# Patient Record
Sex: Female | Born: 1990 | Race: Black or African American | Hispanic: No | Marital: Single | Smoking: Former smoker
Health system: Southern US, Community
[De-identification: ages and names within clinical notes are randomized; demographics above are authoritative.]

## PROBLEM LIST (undated history)

## (undated) ENCOUNTER — Inpatient Hospital Stay (HOSPITAL_COMMUNITY): Payer: Self-pay

## (undated) DIAGNOSIS — G8929 Other chronic pain: Secondary | ICD-10-CM

## (undated) DIAGNOSIS — R109 Unspecified abdominal pain: Secondary | ICD-10-CM

## (undated) DIAGNOSIS — R51 Headache: Secondary | ICD-10-CM

## (undated) DIAGNOSIS — B999 Unspecified infectious disease: Secondary | ICD-10-CM

## (undated) DIAGNOSIS — R519 Headache, unspecified: Secondary | ICD-10-CM

## (undated) DIAGNOSIS — O99019 Anemia complicating pregnancy, unspecified trimester: Secondary | ICD-10-CM

## (undated) DIAGNOSIS — R87629 Unspecified abnormal cytological findings in specimens from vagina: Secondary | ICD-10-CM

## (undated) DIAGNOSIS — A609 Anogenital herpesviral infection, unspecified: Secondary | ICD-10-CM

## (undated) DIAGNOSIS — O99619 Diseases of the digestive system complicating pregnancy, unspecified trimester: Secondary | ICD-10-CM

## (undated) DIAGNOSIS — F32A Depression, unspecified: Secondary | ICD-10-CM

## (undated) DIAGNOSIS — K117 Disturbances of salivary secretion: Secondary | ICD-10-CM

## (undated) DIAGNOSIS — IMO0002 Reserved for concepts with insufficient information to code with codable children: Secondary | ICD-10-CM

## (undated) DIAGNOSIS — F329 Major depressive disorder, single episode, unspecified: Secondary | ICD-10-CM

## (undated) DIAGNOSIS — K219 Gastro-esophageal reflux disease without esophagitis: Secondary | ICD-10-CM

## (undated) HISTORY — DX: Anogenital herpesviral infection, unspecified: A60.9

## (undated) HISTORY — DX: Diseases of the digestive system complicating pregnancy, unspecified trimester: O99.619

## (undated) HISTORY — DX: Disturbances of salivary secretion: K11.7

## (undated) HISTORY — PX: BREAST FIBROADENOMA SURGERY: SHX580

## (undated) HISTORY — DX: Other chronic pain: G89.29

## (undated) HISTORY — DX: Gastro-esophageal reflux disease without esophagitis: K21.9

## (undated) HISTORY — DX: Unspecified abdominal pain: R10.9

## (undated) HISTORY — DX: Reserved for concepts with insufficient information to code with codable children: IMO0002

---
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
## 2008-06-25 ENCOUNTER — Emergency Department (HOSPITAL_COMMUNITY): Admission: EM | Admit: 2008-06-25 | Discharge: 2008-06-25 | Payer: Self-pay | Admitting: Emergency Medicine

## 2008-11-17 ENCOUNTER — Emergency Department (HOSPITAL_COMMUNITY): Admission: EM | Admit: 2008-11-17 | Discharge: 2008-11-17 | Payer: Self-pay | Admitting: Family Medicine

## 2009-04-29 ENCOUNTER — Emergency Department (HOSPITAL_COMMUNITY): Admission: EM | Admit: 2009-04-29 | Discharge: 2009-04-29 | Payer: Self-pay | Admitting: Emergency Medicine

## 2009-11-17 ENCOUNTER — Encounter: Payer: Self-pay | Admitting: Family Medicine

## 2009-11-17 ENCOUNTER — Ambulatory Visit: Payer: Self-pay | Admitting: Family Medicine

## 2009-11-17 LAB — CONVERTED CEMR LAB
Beta hcg, urine, semiquantitative: NEGATIVE
GC Probe Amp, Genital: NEGATIVE

## 2009-11-18 ENCOUNTER — Telehealth: Payer: Self-pay | Admitting: *Deleted

## 2009-11-22 ENCOUNTER — Encounter: Admission: RE | Admit: 2009-11-22 | Discharge: 2009-11-22 | Payer: Self-pay | Admitting: Family Medicine

## 2009-12-27 ENCOUNTER — Emergency Department (HOSPITAL_COMMUNITY)
Admission: EM | Admit: 2009-12-27 | Discharge: 2009-12-27 | Payer: Self-pay | Source: Home / Self Care | Admitting: Family Medicine

## 2010-01-07 ENCOUNTER — Encounter: Payer: Self-pay | Admitting: Family Medicine

## 2010-01-19 ENCOUNTER — Ambulatory Visit: Payer: Self-pay | Admitting: Family Medicine

## 2010-01-21 ENCOUNTER — Ambulatory Visit: Payer: Self-pay | Admitting: Family Medicine

## 2010-02-04 ENCOUNTER — Encounter: Payer: Self-pay | Admitting: Family Medicine

## 2010-02-17 ENCOUNTER — Encounter: Payer: Self-pay | Admitting: Family Medicine

## 2010-04-22 ENCOUNTER — Ambulatory Visit: Payer: Self-pay | Admitting: Family Medicine

## 2010-04-22 LAB — CONVERTED CEMR LAB: Beta hcg, urine, semiquantitative: NEGATIVE

## 2010-07-13 ENCOUNTER — Emergency Department (HOSPITAL_COMMUNITY)
Admission: EM | Admit: 2010-07-13 | Discharge: 2010-07-13 | Payer: Self-pay | Source: Home / Self Care | Admitting: Emergency Medicine

## 2010-07-20 ENCOUNTER — Ambulatory Visit: Payer: Self-pay

## 2010-08-02 ENCOUNTER — Ambulatory Visit
Admission: RE | Admit: 2010-08-02 | Discharge: 2010-08-02 | Payer: Self-pay | Source: Home / Self Care | Attending: Family Medicine | Admitting: Family Medicine

## 2010-08-02 LAB — CONVERTED CEMR LAB: Beta hcg, urine, semiquantitative: NEGATIVE

## 2010-08-12 ENCOUNTER — Other Ambulatory Visit: Payer: Self-pay | Admitting: Family Medicine

## 2010-08-12 ENCOUNTER — Ambulatory Visit: Admission: RE | Admit: 2010-08-12 | Discharge: 2010-08-12 | Payer: Self-pay | Source: Home / Self Care

## 2010-08-12 ENCOUNTER — Encounter: Payer: Self-pay | Admitting: Family Medicine

## 2010-08-12 ENCOUNTER — Other Ambulatory Visit
Admission: RE | Admit: 2010-08-12 | Discharge: 2010-08-12 | Payer: Self-pay | Source: Home / Self Care | Admitting: Family Medicine

## 2010-08-12 DIAGNOSIS — R5383 Other fatigue: Secondary | ICD-10-CM

## 2010-08-12 DIAGNOSIS — R5381 Other malaise: Secondary | ICD-10-CM | POA: Insufficient documentation

## 2010-08-12 LAB — CONVERTED CEMR LAB
Chlamydia, DNA Probe: NEGATIVE
GC Probe Amp, Genital: NEGATIVE
HIV: NONREACTIVE
MCV: 87 fL (ref 78.0–100.0)
RBC: 4.32 M/uL (ref 3.87–5.11)
TSH: 4.104 microintl units/mL (ref 0.350–4.500)
WBC: 9.9 10*3/uL (ref 4.0–10.5)

## 2010-08-15 ENCOUNTER — Encounter: Payer: Self-pay | Admitting: Family Medicine

## 2010-08-23 ENCOUNTER — Telehealth: Payer: Self-pay | Admitting: *Deleted

## 2010-08-23 NOTE — Assessment & Plan Note (Signed)
Summary: tb test/eo  Nurse Visit   Allergies: 1)  ! * Milk 2)  ! * Peanuts  Immunizations Administered:  PPD Skin Test:    Vaccine Type: PPD    Site: right forearm    Mfr: Sanofi Pasteur    Dose: 0.1 ml    Route: ID    Given by: Theresia Lo RN    Exp. Date: 05/05/2010    Lot #: [id number]  Orders Added: 1)  TB Skin Test [86580] 2)  Admin 1st Vaccine [90471]    Appended Document: tb test/eo above expiration date for  PDD was entered in error. exp date is 05/06/2011

## 2010-08-23 NOTE — Letter (Signed)
Summary: Estil Daft Attorney at [facility] at [facility]   Imported By: Clydell Hakim 02/17/2010 16:25:57  _____________________________________________________________________  External Attachment:    Type:   Image     Comment:   External Document

## 2010-08-23 NOTE — Assessment & Plan Note (Signed)
Summary: READ PPD/KH  Nurse Visit   Allergies: 1)  ! * Milk 2)  ! * Peanuts  PPD Results    Date of reading: 01/21/2010    Results: 0 mm    Interpretation: negative  Orders Added: 1)  No Charge Patient Arrived (NCPA0) [NCPA0]

## 2010-08-23 NOTE — Progress Notes (Signed)
  Phone Note Outgoing Call   Call placed by: Lequita Asal  MD,  November 18, 2009 9:47 AM Summary of Call: labs all negative.  Initial call taken by: Lequita Asal  MD,  November 18, 2009 9:47 AM  Follow-up for Phone Call        informed pt of results Follow-up by: Loralee Pacas CMA,  November 18, 2009 4:44 PM

## 2010-08-23 NOTE — Assessment & Plan Note (Signed)
Summary: NP,tcb   Vital Signs:  Patient profile:   20 year old female Height:      65 inches Weight:      122.1 pounds BMI:     20.39 Temp:     98.9 degrees F oral Pulse rate:   81 / minute Pulse rhythm:   regular BP sitting:   114 / 76  (left arm) Cuff size:   regular  Vitals Entered By: Loralee Pacas CMA (November 17, 2009 2:32 PM) CC: new pt Comments bad headaches x 1 yr, sharp stomach pains and vaginal d/c x 5 months. interested in depo   CC:  new pt.  History of Present Illness: 20 y/o female here to establish care  abdominal pain- epigastric/left sided. >5 months. no associated N/V, diarrhea, constipation, dysuria, fever, chills. no relationship to BMs or meals. some relief with tylenol. h/o unprotected sex  ~6 months ago.  vaginal discharge-  ~5 months. malodorous. no vaginal pain or pruritus. unprotected sex as above.   family planning- interested in depo.   Habits & Providers  Alcohol-Tobacco-Diet     Tobacco Status: quit < 6 months     Tobacco Counseling: not to resume use of tobacco products  Allergies (verified): 1)  ! * Milk 2)  ! * Peanuts  Past History:  Past Medical History: chronic abdominal pain  Past Surgical History: breast mass surgery at age 65, fibroadenoma  Family History: breast cancer- aunt HTN- mother DM- mother, sister CVA- maternal grandmother  Social History: single, goes to [employer] denies EtOH, drugs, tobacco. Smoking Status:  quit < 6 months  Physical Exam  General:  Well-developed,well-nourished,in no acute distress; alert,appropriate and cooperative throughout examination. vitals reviewed.  Eyes:  No corneal or conjunctival inflammation noted. EOMI. Perrla. Vision grossly normal. Ears:  External ear exam shows no significant lesions or deformities.  Hearing is grossly normal bilaterally. Nose:  External nasal examination shows no deformity or inflammation. Nasal mucosa are pink and moist without lesions or exudates. Mouth:   Oral mucosa and oropharynx without lesions or exudates.  Teeth in good repair. Neck:  No deformities, masses, or tenderness noted. Lungs:  Normal respiratory effort, chest expands symmetrically. Lungs are clear to auscultation, no crackles or wheezes. Heart:  Normal rate and regular rhythm. S1 and S2 normal without gallop, murmur, click, rub or other extra sounds. Abdomen:  Bowel sounds positive,abdomen soft and without masses. +TTP of RLQ. no rebound or guarding.  Genitalia:  Normal introitus for age, no external lesions, no vaginal discharge, mucosa pink and moist, no vaginal or cervical lesions, no vaginal atrophy, no friability or hemorrhage, normal uterus size and position, +R adnexal tenderness. no CMT   Impression & Recommendations:  Problem # 1:  ABDOMINAL PAIN RIGHT LOWER QUADRANT (ICD-789.03) Assessment New GC/Chl pending. possible adnexal etiology. check ultrasound. unlikely infectious given duration. no GI sxs.  Orders: Ultrasound (Ultrasound)  Problem # 2:  FAMILY PLANNING (ICD-V25.09) Assessment: New given depo.  Orders: U Preg-[facility] (16109) Depo-Provera 150mg  (U0454)  Problem # 3:  CONTACT OR EXPOSURE TO OTHER VIRAL DISEASES (ICD-V01.79) Assessment: New labs below  Orders: GC/Chlamydia-[facility] (87591/87491) Wet Prep- [facility] [phone]) RPR-[facility] [phone]) HIV-[facility] (13086-57846) Hep Bs Ag-[facility] (96295-28413) Hep C Ab-[facility] (24401-02725)  Problem # 4:  CANDIDIASIS, VAGINAL (ICD-112.1) Assessment: New diflucan  Her updated medication list for this problem includes:    Fluconazole 150 Mg Tabs (Fluconazole) ..... One tab by mouth daily x1. repeat in 2 days. Prescriptions: FLUCONAZOLE 150 MG TABS (FLUCONAZOLE) one tab by  mouth daily x1. repeat in 2 days.  #2 x 0   Entered and Authorized by:   Lequita Asal  MD   Signed by:   Lequita Asal  MD on 11/17/2009   Method used:   Electronically to        [facility] [address] (retail)       [phone]        [place], [place]  [address]       Ph: [phone]       Fax: [phone]   RxID:   [id number]   Laboratory Results   Urine Tests  Date/Time Received: November 17, 2009 2:39 PM  Date/Time Reported: November 17, 2009 2:48 PM     Urine HCG: negative Comments: ...........test performed by...........Marland KitchenTerese Door, CMA   Date/Time Received: November 17, 2009 2:39 PM  Date/Time Reported: November 17, 2009 2:58 PM   [employer] Source: vaginal WBC/hpf: 10-20 Bacteria/hpf: 3+  Rods Clue cells/hpf: none  Negative whiff Yeast/hpf: moderate Trichomonas/hpf: none Comments: ...........test performed by...........Marland KitchenTerese Door, CMA      Medication Administration  Injection # 1:    Medication: Depo-Provera 150mg     Diagnosis: FAMILY PLANNING (ICD-V25.09)    Route: IM    Site: LUOQ gluteus    Exp Date: 11/22/2011    Lot #: [id number]    Mfr: [email or web address]    Comments: pt to rtc 07.14.2011 thru 07.27.2011    Patient tolerated injection without complications    Given by: Loralee Pacas CMA (November 17, 2009 3:18 PM)  Orders Added: 1)  U Preg-[facility] [81025] 2)  GC/Chlamydia-[facility] [87591/87491] 3)  Wet Prep- [facility] [87210] 4)  RPR-[facility] [27253-66440] 5)  HIV-[facility] [34742-59563] 6)  Hep Bs Ag-[facility] [87564-33295] 7)  Hep C Ab-[facility] [18841-66063] 8)  Ultrasound [Ultrasound] 9)  Depo-Provera 150mg  [J1055] 10)  [facility]- New Level 3 [99203]

## 2010-08-23 NOTE — Letter (Signed)
Summary: Staff Medical Report  Staff Medical Report   Imported By: Clydell Hakim 02/17/2010 16:24:59  _____________________________________________________________________  External Attachment:    Type:   Image     Comment:   External Document
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
## 2010-08-23 NOTE — Miscellaneous (Signed)
  Clinical Lists Changes  Problems: Removed problem of ABDOMINAL PAIN RIGHT LOWER QUADRANT (ICD-789.03) Removed problem of FAMILY PLANNING (ICD-V25.09) Removed problem of CONTACT OR EXPOSURE TO OTHER VIRAL DISEASES (ICD-V01.79)

## 2010-08-25 NOTE — Assessment & Plan Note (Signed)
Summary: toothache, depo/bmc   DEPO SHOT AND FLU SHOT GIVEN TODAY.Jimmy Footman, CMA  August 02, 2010 10:30 AM  *****NEXT DEPO DUE 10/19/2010-11/02/2010****  Vital Signs:  Patient profile:   20 year old female Height:      65 inches Weight:      122.3 pounds BMI:     20.43 Temp:     98.4 degrees F oral Pulse rate:   71 / minute BP sitting:   125 / 72  (right arm) Cuff size:   regular  Vitals Entered By: Jimmy Footman, CMA (August 02, 2010 9:05 AM) CC: dental referral, depo shot Is Patient Diabetic? No Pain Assessment Patient in pain? no        Primary Care Provider:  . [facility]  CC:  dental referral and depo shot.  History of Present Illness: 20 yo here for contraception and toothache.  toothache: toothache for 1 month.  right side, been taking ibuprofen with good pain control. Fever 101 several weeks ago with flu like illness, otherwise afebrile.  No swelling, troublel swallowing.  Depo:  amenorrheic since starting depo.  This will be her third shot.  last Sept 30th.  Last sex Dec 30th.  Desire to continue with depo.    Habits & Providers  Alcohol-Tobacco-Diet     Tobacco Status: never  Current Medications (verified): 1)  None  Allergies: 1)  ! * Milk 2)  ! * Peanuts PMH-FH-SH reviewed for relevance  Social History: Smoking Status:  never  Review of Systems      See HPI  Physical Exam  General:  Well-developed,well-nourished,in no acute distress; alert,appropriate and cooperative throughout examination Mouth:  left lower wisdom tooth eruption without infection Neck:  No deformities, masses, or tenderness noted. Lungs:  Normal respiratory effort, chest expands symmetrically. Lungs are clear to auscultation, no crackles or wheezes. Heart:  Normal rate and regular rhythm. S1 and S2 normal without gallop, murmur, click, rub or other extra sounds.   Impression & Recommendations:  Problem # 1:  OTHER SPEC DISORDERS TOOTH DEVELOPMENT&ERUPTION  (ICD-520.8) Will refer to dentistry for extraction  Orders: Dental Referral (Denti  Problem # 2:  CONTRACEPTIVE MANAGEMENT (ICD-V25.09) Has been > 14 weeks since last depo but only sexual encounter was within 3 months.  Will check upreg today but no need for repeat testing.  Administer depo today, advised condoms always and return for pap smear.  Complete Medication List: 1)  Depo-provera 150 Mg/ml Susp (Medroxyprogesterone acetate) .... Q 12 weeks  Other Orders: U Preg-[facility] (16109) Dental Referral (Dentist) [facility]- Est Level  3 (60454) Flu Vaccine 40yrs + (09811) Admin 1st Vaccine (91478) Depo-Provera 150mg  (G9562)  Patient Instructions: 1)  You need to schedule an gynecological exam to get your pap smear. 2)  Rememeber to get your depo every 12 weeks. 3)  You got you flu shot today! 4)  You can call back to see where you are on the dentist list.  continue ibuprofen as needed for pain.   Medication Administration  Injection # 1:    Medication: Depo-Provera 150mg     Diagnosis: CONTRACEPTIVE MANAGEMENT (ICD-V25.09)    Route: IM    Site: L deltoid    Exp Date: 11/2012    Lot #: [id number]    Mfr: [email or web address]    Comments: NEXT DEPO DUE 3/28-4/11    Patient tolerated injection without complications    Given by: Jimmy Footman, CMA (August 02, 2010 10:32 AM)  Orders Added: 1)  U Preg-[facility] [81025] 2)  Dental Referral [Dentist] 3)  [facility]- Est Level  3 [99213] 4)  Flu Vaccine 39yrs + [90658] 5)  Admin 1st Vaccine [90471] 6)  Depo-Provera 150mg  [J1055]   Immunizations Administered:  Influenza Vaccine # 1:    Vaccine Type: Fluvax 3+    Site: right deltoid    Mfr: GlaxoSmithKline    Dose: 0.5 ml    Route: IM    Given by: Jimmy Footman, CMA    Exp. Date: 01/21/2011    Lot #: [id number]    VIS given: 02/15/10 version given August 02, 2010.  Flu Vaccine Consent Questions:    Do you have a history of severe allergic reactions to this vaccine? no    Any prior history of allergic  reactions to egg and/or gelatin? no    Do you have a sensitivity to the preservative Thimersol? no    Do you have a past history of Guillan-Barre Syndrome? no    Do you currently have an acute febrile illness? no    Have you ever had a severe reaction to latex? no    Vaccine information given and explained to patient? yes    Are you currently pregnant? no   Immunizations Administered:  Influenza Vaccine # 1:    Vaccine Type: Fluvax 3+    Site: right deltoid    Mfr: GlaxoSmithKline    Dose: 0.5 ml    Route: IM    Given by: Jimmy Footman, CMA    Exp. Date: 01/21/2011    Lot #: [id number]    VIS given: 02/15/10 version given August 02, 2010.  Laboratory Results   Urine Tests  Date/Time Received: August 02, 2010 9:48 AM  Date/Time Reported: August 02, 2010 9:54 AM     Urine HCG: negative Comments: ...............test performed by......Marland KitchenBonnie A. [place], MLS (ASCP)cm

## 2010-08-25 NOTE — Letter (Signed)
Summary: Results Follow-up Letter  [facility]  [address]   [place], [place] [address]   Phone: [phone]  Fax: [phone]    08/15/2010  [address], [place]  [address]  Dear Ms. Milsap,   The following are the results of your recent test(s):  Test     Result     Pap Smear    Normal  your other tests were all normal.  These included thyroid, trichomonas, HIV, syphillis, Gonorrhea, and Chlamydia.  Sincerely,  Delbert Harness MD [facility]           Appended Document: Results Follow-up Letter mailed

## 2010-08-25 NOTE — Assessment & Plan Note (Signed)
Summary: annual gyn/KH   Vital Signs:  Patient profile:   20 year old female Height:      65 inches Weight:      119.9 pounds BMI:     20.02 Temp:     98.8 degrees F oral Pulse rate:   96 / minute BP sitting:   116 / 76  (left arm) Cuff size:   regular  Vitals Entered By: Jimmy Footman, CMA (August 12, 2010 9:10 AM) CC: annual gyn Is Patient Diabetic? No Pain Assessment Patient in pain? no        Primary Care Provider:  . [facility]  CC:  annual gyn.  History of Present Illness: 20 yo here for annual gynecological exam LMP:  amenorrheic since startign depo Contraception: depo Regular Menses: no Hx of Anemia: not confirmed FHx of Breast, Uterine, Cervical or Ovarian Cancer: brest ca in aunt in 7's Last Pap:  states had one previously with another provider whcih was normal Hx of Abnormal Pap:  No Desires STD testing: yes Abnormalities on Self-exam:  No    Habits & Providers  Alcohol-Tobacco-Diet     Tobacco Status: current  Current Medications (verified): 1)  Depo-Provera 150 Mg/ml Susp (Medroxyprogesterone Acetate) .... Q 12 Weeks  Allergies: 1)  ! * Milk 2)  ! 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 Denies EtOH, drugs. Occ smoking, relieves stress  Past medical, surgical, family and social histories (including risk factors) reviewed, and no changes noted (except as noted below).  Past Medical History: Reviewed history from 11/17/2009 and no changes required. chronic abdominal pain  Past Surgical History: Reviewed history from 11/17/2009 and no changes required. breast mass surgery at age 73, fibroadenoma  Family History: Reviewed history from 11/17/2009 and no changes required. breast cancer- aunt age 75 HTN- mother DM-  mother, sister CVA- maternal grandmother lung cancer: grandfather  Social History: Reviewed history from 04/22/2010 and no changes required. 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 Denies EtOH, drugs. Occ smoking, relieves stressSmoking Status:  current  Review of Systems      See HPI General:  Complains of fatigue; denies sleep disorder and weakness. ENT:  Complains of sore throat. CV:  Denies chest pain or discomfort and shortness of breath with exertion. Resp:  Denies cough and shortness of breath. GI:  Denies abdominal pain, constipation, and diarrhea. GU:  Denies abnormal vaginal bleeding, discharge, dysuria, and genital sores.  Physical Exam  General:  Well-developed,well-nourished,in no acute distress; alert,appropriate and cooperative throughout examination Eyes:  No corneal or conjunctival inflammation noted. EOMI. Perrla.  Ears:  External ear exam shows no significant lesions or deformities.  Otoscopic examination reveals clear canals, tympanic membranes are intact bilaterally without bulging, retraction, inflammation or discharge. Hearing is grossly normal bilaterally. Mouth:  Oral mucosa and oropharynx without lesions or exudates.  Teeth in good repair. Neck:  No deformities, masses, or tenderness noted. Breasts:  No mass, nodules, thickening, tenderness, bulging, retraction, inflamation, nipple discharge or skin changes noted.   Lungs:  Normal respiratory effort, chest expands symmetrically. Lungs are clear to auscultation, no crackles or wheezes. Heart:  Normal rate and regular rhythm. S1 and S2 normal without gallop, murmur, click, rub or other extra sounds. Abdomen:  Bowel sounds positive,abdomen soft and non-tender without masses, organomegaly  or hernias noted. Genitalia:  Pelvic Exam:        External: normal female genitalia without lesions or masses        Vagina: normal without lesions or masses        Cervix: normal without lesions or masses        Adnexa: normal  bimanual exam without masses or fullness        Uterus: normal by palpation        Pap smear: performed Extremities:  no LE edema Neurologic:  alert & oriented X3.  gait normal Psych:  Oriented X3, memory intact for recent and remote, normally interactive, good eye contact, not anxious appearing, and not depressed appearing.     Impression & Recommendations:  Problem # 1:  Gynecological examination-routine (ICD-V72.31) STD screening, reviewed history.  Discussed smoking cessation- precontemplative.  Problem # 2:  FATIGUE (ICD-780.79) Will check CBC and TSH.  Advised regular exercise. Orders: CBC-[facility] (16109) TSH-[facility] (60454-09811)  Complete Medication List: 1)  Depo-provera 150 Mg/ml Susp (Medroxyprogesterone acetate) .... Q 12 weeks  Other Orders: Pap Smear-[facility] (91478-29562) Wet Prep- [facility] [phone]) GC/Chlamydia-[facility] (87591/87491) HIV-[facility] (57846-96295) RPR-[facility] [phone])  Patient Instructions: 1)  try gummi or flintstone vitamins.  Make sure your get 400-800 units of folic acid daily. 2)  Adding a daily exercise routine can help combat fatigue 3)  Follow-up in 1 year or sooner if needed   Orders Added: 1)  Pap Smear-[facility] [02725-36644] 2)  Wet Prep- [facility] [87210] 3)  GC/Chlamydia-[facility] [87591/87491] 4)  CBC-[facility] [85027] 5)  TSH-[facility] [03474-25956] 6)  HIV-[facility] [38756-43329] 7)  RPR-[facility] [51884-16606] 8)  [facility]- New 18-41yrs [99385]     Prevention & Chronic Care Immunizations   Influenza vaccine: Fluvax 3+  (08/02/2010)    Tetanus booster: Not documented    Pneumococcal vaccine: Not documented  Other Screening   Pap smear: Not documented   Smoking status: current  (08/12/2010)  Appended Document: wet prep results    Lab Visit  Laboratory Results  Date/Time Received: August 12, 2010 9:24 AM  Date/Time Reported: August 12, 2010 9:45 AM   Wet [place] Source: vag WBC/hpf: 5-10 Bacteria/hpf: 3+ rods and cocci Clue cells/hpf: few  Negative whiff Yeast/hpf:  few Trichomonas/hpf: none Comments: ...............test performed by......Marland KitchenBonnie A. [place], MLS (ASCP)cm   Orders Today:

## 2010-08-31 NOTE — Progress Notes (Signed)
Summary: phn msg  Phone Note Call from Patient Call back at Home Phone [phone]   Caller: Patient Summary of Call: wants to know where she is on the dental referral list Initial call taken by: De Nurse,  August 23, 2010 12:26 PM  Follow-up for Phone Call        spoke with pt and informed her that there is a long wait on the dental list. We fax over order and they will get in touch with her with an appt. Follow-up by: Jimmy Footman, CMA,  August 23, 2010 4:06 PM

## 2010-09-22 ENCOUNTER — Ambulatory Visit (INDEPENDENT_AMBULATORY_CARE_PROVIDER_SITE_OTHER): Payer: Self-pay | Admitting: Family Medicine

## 2010-09-22 ENCOUNTER — Encounter: Payer: Self-pay | Admitting: Family Medicine

## 2010-09-22 DIAGNOSIS — R5381 Other malaise: Secondary | ICD-10-CM

## 2010-09-22 DIAGNOSIS — G43909 Migraine, unspecified, not intractable, without status migrainosus: Secondary | ICD-10-CM | POA: Insufficient documentation

## 2010-09-22 DIAGNOSIS — R51 Headache: Secondary | ICD-10-CM

## 2010-09-22 MED ORDER — PROCHLORPERAZINE MALEATE 5 MG PO TABS: 5.0000 mg | ORAL_TABLET | Freq: Four times a day (QID) | ORAL | Status: AC | PRN

## 2010-09-22 MED ORDER — NAPROXEN 500 MG PO TABS: 500.0000 mg | ORAL_TABLET | Freq: Two times a day (BID) | ORAL | Status: DC

## 2010-09-22 MED ORDER — PROCHLORPERAZINE MALEATE 5 MG PO TABS: 5.0000 mg | ORAL_TABLET | Freq: Four times a day (QID) | ORAL | Status: DC | PRN

## 2010-09-22 NOTE — Assessment & Plan Note (Signed)
Increased frequency of HA, uncontrolled on excedrin. At danger of confounding with medication withdrawal headaches as well. Recommend she start Naproxen as an alternative. Compazine as an adjunct for nausea associated with migraines.  F/u in 1-2 weeks with PCP to consider other abortive therapy or addition of prophylactic medication if still uncontrolled.

## 2010-09-22 NOTE — Progress Notes (Signed)
  Subjective:    Patient ID: Kathryn Fox, female    DOB: 09-16-1990, 19 y.o.   MRN: [id number]  HPI 1. Headaches- Present for many years, but increasing in frequency. Now 4/7 days of the week. Pain begins in a "knot" in her posterior neck, then radiates superiorly to right side of head, then bilateral. Yesterday was 9/10, today is only 2/10. Improved by nothing, worsened by working. Infrequently has associated phonophobia and nausea. Has taken excedrin when needed, and this does improve symptoms for 4-5 hours, then HA returns.  Denies fevers, URI symptoms, sinus congestion, ear pain, vomitting, visual changes, weakness, LOC.     Review of Systems See HPI. Also endorses some light vaginal bleeding, irregular since beginning depo injections one year ago. Last injection 08/02/10, next due in April.     Objective:   Physical Exam  Vitals reviewed. Constitutional: She is oriented to person, place, and time. She appears well-developed and well-nourished. No distress.  HENT:  Head: Normocephalic and atraumatic.  Right Ear: External ear normal.  Left Ear: External ear normal.  Nose: Nose normal.  Mouth/Throat: Oropharynx is clear and moist. No oropharyngeal exudate.       Bilateral TMs wnl.  Eyes: Conjunctivae and EOM are normal. Pupils are equal, round, and reactive to light. Right conjunctiva is not injected. Left conjunctiva is not injected.       Fundi appear wnl. Normal vasculature.  Neck: Normal range of motion. Neck supple.  Cardiovascular: Normal rate, regular rhythm and normal heart sounds.   Musculoskeletal: Normal range of motion.       Mild small foci of tenderness on right trapezius, no hypertonicity palpated.   Lymphadenopathy:    She has no cervical adenopathy.  Neurological: She is alert and oriented to person, place, and time. She has normal strength. No cranial nerve deficit or sensory deficit. She exhibits normal muscle tone. Coordination and gait normal.  Skin: She is  not diaphoretic.          Assessment & Plan:

## 2010-09-22 NOTE — Patient Instructions (Signed)
Nice to meet you. Your headaches are being caused by neck muscle spasms. Can try heating pads, massage therapy, relaxation techniques. You may try taking naproxen twice daily for 1-2 weeks, then only as needed. You can use compazine if you develop nausea, only when needed. Make an appointment in 1-2 weeks for follow up.

## 2010-10-19 ENCOUNTER — Telehealth: Payer: Self-pay | Admitting: Family Medicine

## 2010-10-19 NOTE — Telephone Encounter (Signed)
Up front to be picked up

## 2010-10-19 NOTE — Telephone Encounter (Signed)
Patient needs copy of shot record.   They would like to pick it up tomorrow afternoon.

## 2010-10-27 LAB — URINALYSIS, ROUTINE W REFLEX MICROSCOPIC
Bilirubin Urine: NEGATIVE
Ketones, ur: 15 mg/dL — AB
Nitrite: NEGATIVE
Protein, ur: NEGATIVE mg/dL
Urobilinogen, UA: 1 mg/dL (ref 0.0–1.0)

## 2010-10-27 LAB — URINE MICROSCOPIC-ADD ON

## 2010-10-27 LAB — POCT PREGNANCY, URINE: Preg Test, Ur: NEGATIVE

## 2010-10-27 LAB — RAPID STREP SCREEN (MED CTR MEBANE ONLY): Streptococcus, Group A Screen (Direct): NEGATIVE

## 2010-11-02 LAB — GC/CHLAMYDIA PROBE AMP, GENITAL
Chlamydia, DNA Probe: NEGATIVE
GC Probe Amp, Genital: NEGATIVE

## 2010-11-02 LAB — POCT URINALYSIS DIP (DEVICE)
Bilirubin Urine: NEGATIVE
Glucose, UA: NEGATIVE mg/dL
Ketones, ur: NEGATIVE mg/dL
Nitrite: NEGATIVE
Specific Gravity, Urine: 1.025 (ref 1.005–1.030)
pH: 6.5 (ref 5.0–8.0)

## 2010-11-02 LAB — WET PREP, GENITAL

## 2010-11-02 LAB — URINE CULTURE

## 2010-11-08 ENCOUNTER — Ambulatory Visit: Payer: Self-pay

## 2010-11-29 ENCOUNTER — Ambulatory Visit: Payer: Self-pay

## 2010-12-09 NOTE — Op Note (Signed)
NAME:  [name], Kathryn Fox                         ACCOUNT NO.:  [id number]   MEDICAL RECORD NO.:  [id number]                   PATIENT TYPE:  AMB   LOCATION:  [facility]                                  FACILITY:  [facility]   PHYSICIAN:  Currie Paris, M.D.           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   OPERATION PERFORMED:  Excision of right breast mass.   SURGEON:  Currie Paris, M.D.   ANESTHESIA:  General.   INDICATIONS FOR PROCEDURE:  This patient is a 20 year old who had a breast  mass noted a year ago that a biopsy showed fibroadenoma but it appeared  clinically to have enlarged and the patient and her mother wished this to be  removed.   DESCRIPTION OF PROCEDURE:  The patient was seen in the holding area and had  no further questions.  The right breast was marked as the operative side and  she was taken to the operating room.  The mass was readily palpable at the  areolar margin measuring about 4 to 5 cm.  After satisfactory general LMA  anesthesia, the breast was prepped and draped.  I anesthetized the skin  around the lesion with some 0.25% plain Marcaine and then made a curvilinear  incision at the areolar margin.  Primarily using cautery, the mass was  excised trying to take a little bit of fibrous tissue around it.  It came  out intact.  The area was checked for hemostasis and once everything  appeared to be dry,  I injected more local into the deeper tissues and  further around in the margins of the skin to help with postoperative pain  relief.   A final check was made for hemostasis and the breast then closed with 3-0  Vicryl followed by 4-0 Monocryl subcuticular and Dermabond.  The patient  tolerated the  procedure well.  There were no operative complications.  All  counts were correct.                                               Currie Paris, M.D.    CJS/MEDQ  D:  01/11/2004  T:  01/12/2004  Job:  [id number]   cc:   [facility]

## 2010-12-28 ENCOUNTER — Emergency Department (HOSPITAL_COMMUNITY)
Admission: EM | Admit: 2010-12-28 | Discharge: 2010-12-28 | Discharge status: Against Medical Advice | Payer: Self-pay | Attending: Emergency Medicine | Admitting: Emergency Medicine

## 2010-12-28 DIAGNOSIS — N63 Unspecified lump in unspecified breast: Secondary | ICD-10-CM | POA: Insufficient documentation

## 2010-12-28 DIAGNOSIS — R42 Dizziness and giddiness: Secondary | ICD-10-CM | POA: Insufficient documentation

## 2010-12-28 DIAGNOSIS — R0602 Shortness of breath: Secondary | ICD-10-CM | POA: Insufficient documentation

## 2010-12-28 DIAGNOSIS — N644 Mastodynia: Secondary | ICD-10-CM | POA: Insufficient documentation

## 2010-12-29 ENCOUNTER — Telehealth: Payer: Self-pay | Admitting: Family Medicine

## 2010-12-29 NOTE — Telephone Encounter (Signed)
Pt requesting to pick up copy of shot record asap for an appt this afternoon, call when ready

## 2010-12-29 NOTE — Telephone Encounter (Signed)
Spoke with patient and informed her that shot record is up front to be picked up. Patient's mother will come by and pick up

## 2010-12-30 ENCOUNTER — Ambulatory Visit: Payer: Self-pay | Admitting: Sports Medicine

## 2011-01-13 ENCOUNTER — Ambulatory Visit: Payer: Self-pay

## 2011-01-20 ENCOUNTER — Ambulatory Visit (INDEPENDENT_AMBULATORY_CARE_PROVIDER_SITE_OTHER): Payer: Self-pay | Admitting: *Deleted

## 2011-01-20 DIAGNOSIS — Z23 Encounter for immunization: Secondary | ICD-10-CM

## 2011-01-20 MED ORDER — TUBERCULIN PPD 5 UNIT/0.1ML ID SOLN: 5.0000 [IU] | Freq: Once | INTRADERMAL | Status: DC

## 2011-01-20 MED ORDER — TETANUS-DIPHTH-ACELL PERTUSSIS 5-2.5-18.5 LF-MCG/0.5 IM SUSP: 0.5000 mL | Freq: Once | INTRAMUSCULAR | Status: DC

## 2011-01-23 ENCOUNTER — Ambulatory Visit (INDEPENDENT_AMBULATORY_CARE_PROVIDER_SITE_OTHER): Payer: Self-pay | Admitting: *Deleted

## 2011-01-23 DIAGNOSIS — Z111 Encounter for screening for respiratory tuberculosis: Secondary | ICD-10-CM

## 2011-01-23 DIAGNOSIS — IMO0001 Reserved for inherently not codable concepts without codable children: Secondary | ICD-10-CM

## 2011-01-23 NOTE — Progress Notes (Signed)
PPD negative-0 mm. 

## 2011-01-26 ENCOUNTER — Telehealth: Payer: Self-pay | Admitting: Family Medicine

## 2011-01-26 NOTE — Telephone Encounter (Signed)
Pt needs to know if she is still on the list to see a dentist? Has been waiting since January.

## 2011-01-27 NOTE — Telephone Encounter (Signed)
Re-faxed referral.

## 2011-02-20 ENCOUNTER — Ambulatory Visit (INDEPENDENT_AMBULATORY_CARE_PROVIDER_SITE_OTHER): Payer: Self-pay | Admitting: Family Medicine

## 2011-02-20 DIAGNOSIS — M542 Cervicalgia: Secondary | ICD-10-CM

## 2011-02-20 DIAGNOSIS — G8929 Other chronic pain: Secondary | ICD-10-CM

## 2011-02-20 NOTE — Patient Instructions (Signed)
It was good to see you today. Please try sleeping on your back for a few nights and see if that helps your neck pain. I would also recommend trying some capsaicin roll-on gel for your neck.  That is something you can get at the drug store over the counter.

## 2011-02-25 DIAGNOSIS — M542 Cervicalgia: Secondary | ICD-10-CM | POA: Insufficient documentation

## 2011-02-25 DIAGNOSIS — G8929 Other chronic pain: Secondary | ICD-10-CM | POA: Insufficient documentation

## 2011-02-25 NOTE — Progress Notes (Signed)
Subjective: the patient presents today with no pressing complaints. She reports that she is having problems with neck pain that is present primarily in the morning. She says that she lies down to go to sleep, and within about 30 to 45 min. of lying down she begins to experience pain. She does not appear to interfere with her sleep, however she is in significant pain when she wakes up in the morning. She is been taking one extra strength Excedrin every morning for this. This seems to relieve the pain, and by midmorning she is pain free. She does not report any numbness, tingling, visual changes, headache, or decrease in range of motion.  Other than this, the patient reports no significant health concerns. Review of systems was performed and was negative except as stated above.  Objective: vitals reviewed. General: no acute distress, cooperative with exam. H EENT: Paris spinous muscles are slightly tense, however other than this there are no significant findings on exam of her neck. There are no bony abnormalities. There is no point tenderness. Range of motion is full. Neck is supple. Cardiovascular: regular rate and rhythm, no murmurs rubs or gallops. Respiratory: cleared auscultation bilaterally. No wheezes. Abdomen: soft nontender nondistended. Extremities: no edema. 2+ pulses.

## 2011-02-25 NOTE — Assessment & Plan Note (Signed)
The patient's neck pain appears to be primarily related how she is sleeping as it is worst in the morning and essentially gone at night. While I would prefer that she not use as much over-the-counter pain medication as she is currently, I also am not inclined to prescribe something significantly stronger. We will suggest changing sleep physician, and a topical pain relief. Depending on how she responds to this, we might also consider Voltaren gel, however will hold off on this for the time being.

## 2011-03-09 ENCOUNTER — Telehealth: Payer: Self-pay | Admitting: Family Medicine

## 2011-03-09 ENCOUNTER — Ambulatory Visit: Payer: Self-pay | Admitting: Sports Medicine

## 2011-03-09 NOTE — Telephone Encounter (Signed)
Spasms in her neck really bad and needs medication for this.  Has made an appt with Dr. Berline Chough for this afternoon.

## 2011-03-10 ENCOUNTER — Ambulatory Visit: Payer: Self-pay | Admitting: Family Medicine

## 2011-04-28 LAB — URINALYSIS, ROUTINE W REFLEX MICROSCOPIC
Bilirubin Urine: NEGATIVE
Glucose, UA: NEGATIVE mg/dL
Specific Gravity, Urine: 1.005 (ref 1.005–1.030)

## 2011-04-28 LAB — CBC
HCT: 36.5 % (ref 36.0–46.0)
Hemoglobin: 12.4 g/dL (ref 12.0–15.0)
MCHC: 34 g/dL (ref 30.0–36.0)
RBC: 4.38 MIL/uL (ref 3.87–5.11)
RDW: 12.6 % (ref 11.5–15.5)

## 2011-04-28 LAB — BASIC METABOLIC PANEL
CO2: 25 mEq/L (ref 19–32)
Calcium: 9.4 mg/dL (ref 8.4–10.5)
GFR calc Af Amer: >60 mL/min (ref >60)
Glucose, Bld: 66 mg/dL — ABNORMAL LOW (ref 70–99)
Potassium: 3.6 mEq/L (ref 3.5–5.1)
Sodium: 142 mEq/L (ref 135–145)

## 2011-04-28 LAB — DIFFERENTIAL
Eosinophils Relative: 1 % (ref 0–5)
Lymphocytes Relative: 37 % (ref 12–46)
Monocytes Absolute: 0.4 10*3/uL (ref 0.1–1.0)
Monocytes Relative: 8 % (ref 3–12)
Neutro Abs: 3.2 10*3/uL (ref 1.7–7.7)

## 2011-04-28 LAB — URINE MICROSCOPIC-ADD ON

## 2011-04-28 LAB — GC/CHLAMYDIA PROBE AMP, GENITAL
Chlamydia, DNA Probe: NEGATIVE
GC Probe Amp, Genital: NEGATIVE

## 2011-05-20 IMAGING — US US PELVIS COMPLETE
1 series · 14 of 25 positions shown · non-contrast
Comparison: None

CLINICAL DATA: Pelvic pain.  Question ovarian cyst or mass.

TRANSABDOMINAL AND TRANSVAGINAL ULTRASOUND OF PELVIS
TECHNIQUE: Both transabdominal and transvaginal ultrasound
examinations of the pelvis were performed including evaluation of
the uterus, ovaries, adnexal regions, and pelvic cul-de-sac.

[Series 1: us pelvis complete · 0.22mm/px · 14 of 60 slices shown]
[im 1/60]
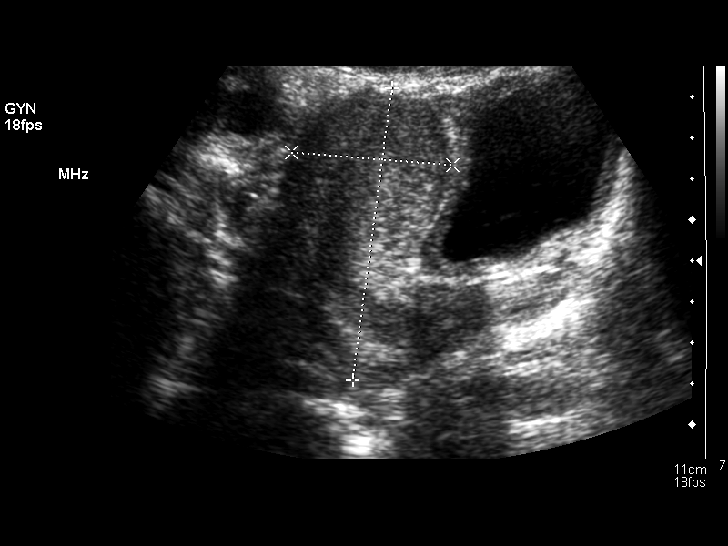
[im 5/60]
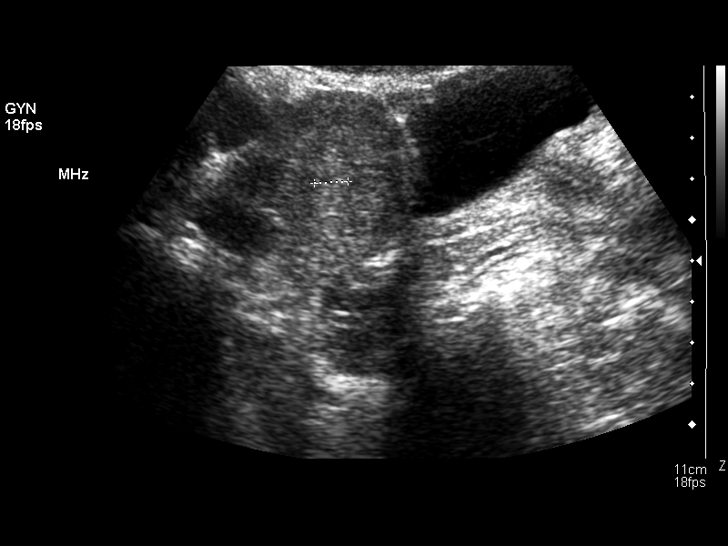
[im 10/60]
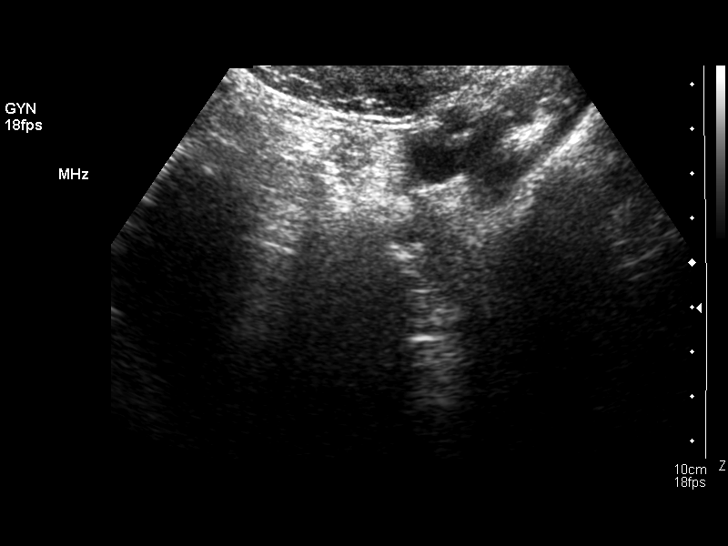
[im 15/60]
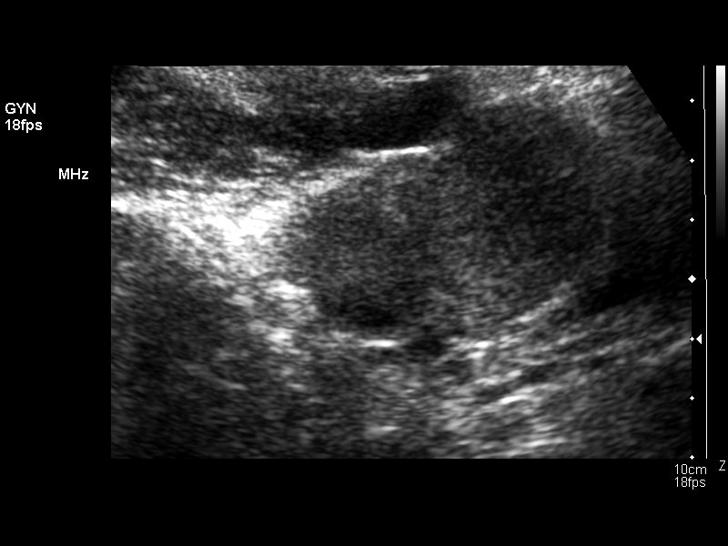
[im 20/60]
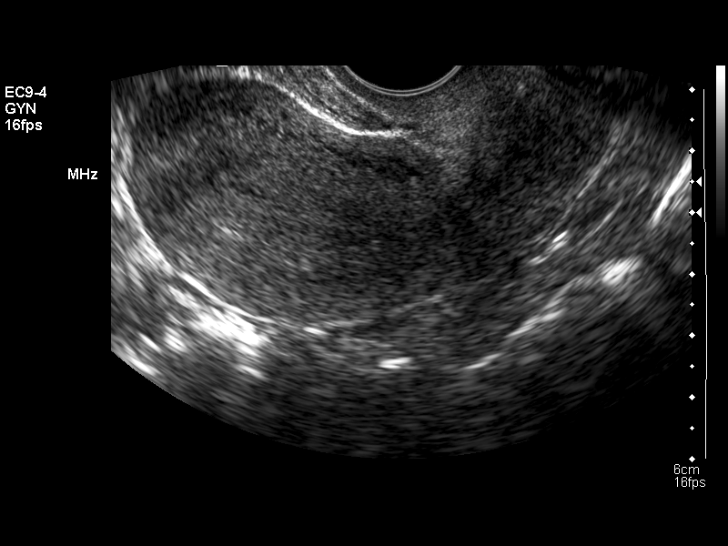
[im 23/60]
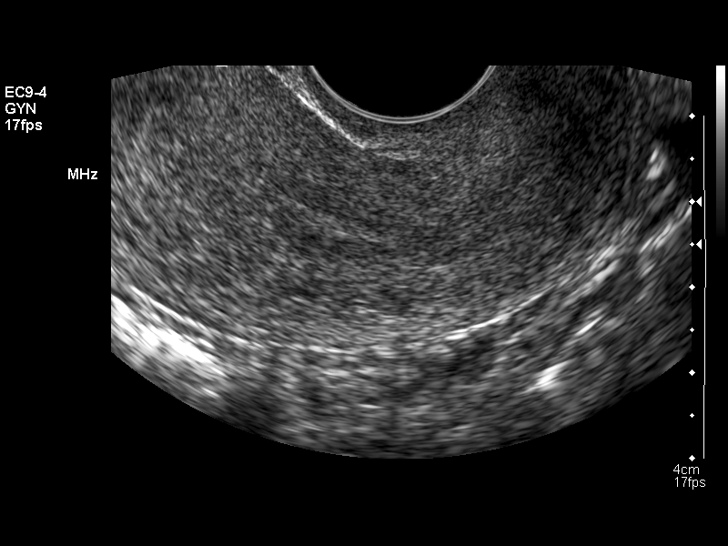
[im 28/60]
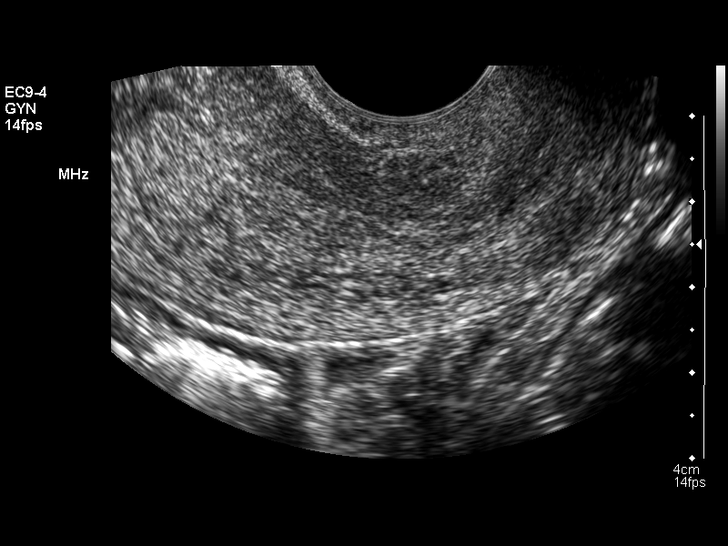
[im 32/60]
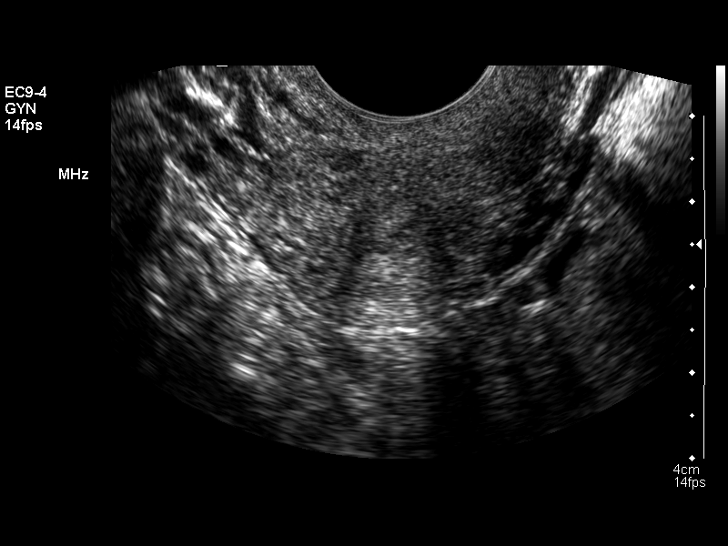
[im 37/60]
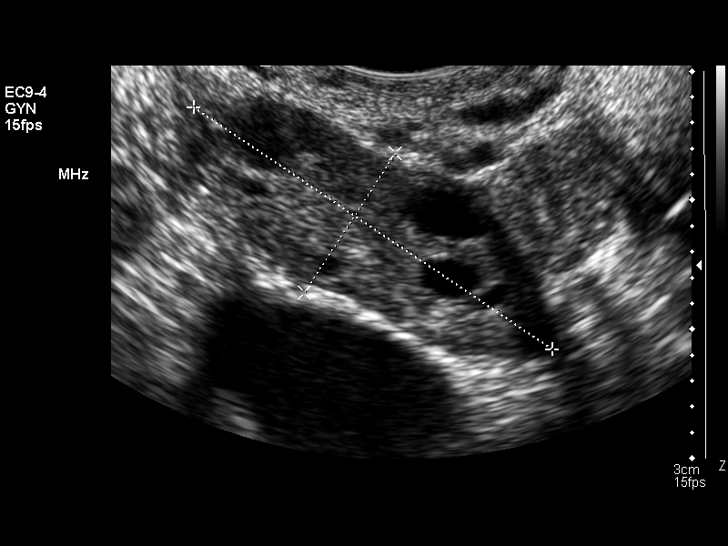
[im 40/60]
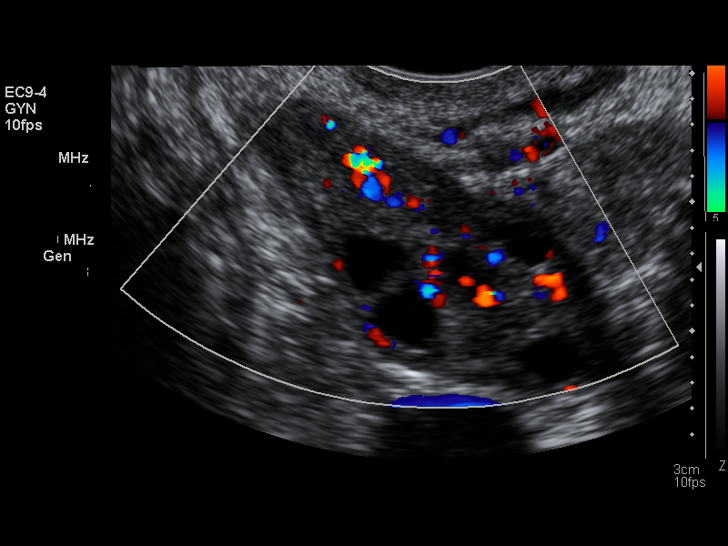
[im 45/60]
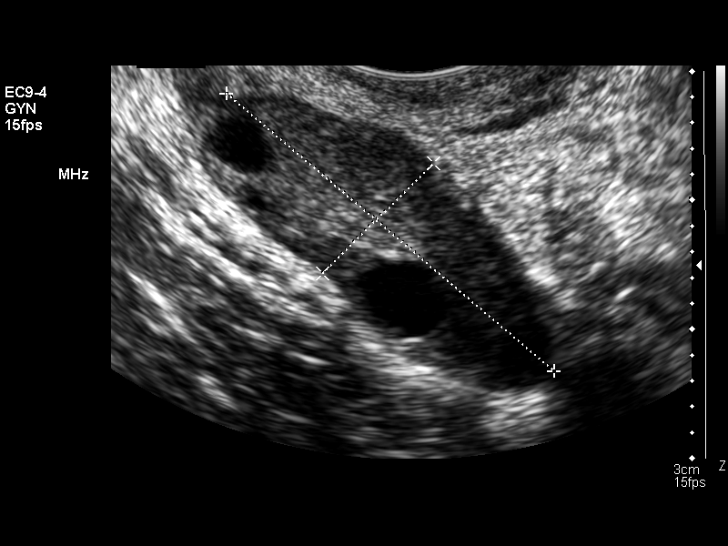
[im 50/60]
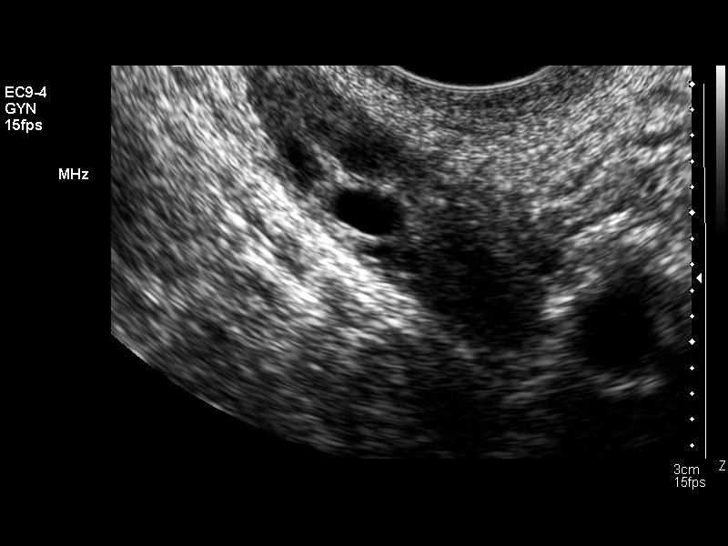
[im 55/60]
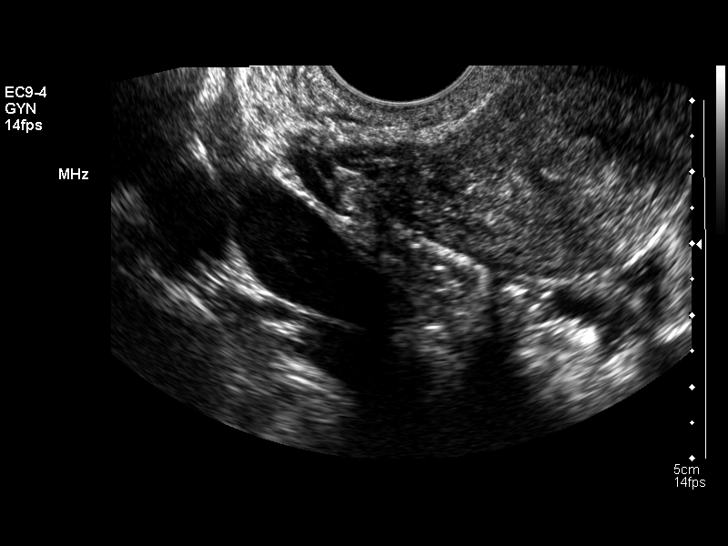
[im 60/60]
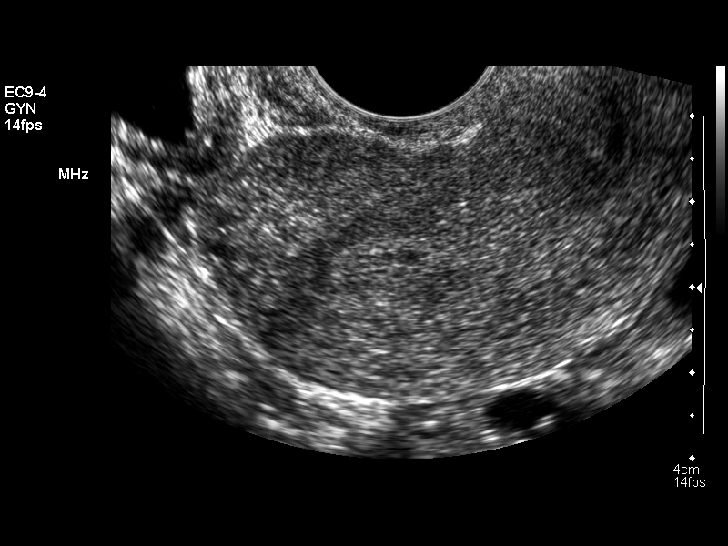

[14 of 25 positions shown; findings below may reference images not displayed]

FINDINGS: Uterus: 9.6 x 3.5 x 4.8 cm.  Small Nabothian cysts.  Echotexture is
normal.  No focal masses or significant abnormality.

Endometrium: Normal, 4 mm.

Right Ovary: 3.3 x 1.2 x 2.5 cm. Normal size and echotexture.  No
adnexal masses. The

Left Ovary: 3.4 x 1.3 x 1.4 cm. Normal size and echotexture.  No
adnexal masses.

Other Findings:  No free fluid.
IMPRESSION: Unremarkable pelvic ultrasound.

## 2011-08-24 ENCOUNTER — Encounter: Payer: Self-pay | Admitting: Family Medicine

## 2011-08-24 ENCOUNTER — Ambulatory Visit (INDEPENDENT_AMBULATORY_CARE_PROVIDER_SITE_OTHER): Payer: Self-pay | Admitting: Family Medicine

## 2011-08-24 VITALS — BP 114/72 | Ht 65.0 in | Wt 128.0 lb

## 2011-08-24 DIAGNOSIS — Z309 Encounter for contraceptive management, unspecified: Secondary | ICD-10-CM

## 2011-08-24 DIAGNOSIS — H6090 Unspecified otitis externa, unspecified ear: Secondary | ICD-10-CM | POA: Insufficient documentation

## 2011-08-24 DIAGNOSIS — H6120 Impacted cerumen, unspecified ear: Secondary | ICD-10-CM

## 2011-08-24 DIAGNOSIS — Z23 Encounter for immunization: Secondary | ICD-10-CM

## 2011-08-24 DIAGNOSIS — IMO0001 Reserved for inherently not codable concepts without codable children: Secondary | ICD-10-CM

## 2011-08-24 LAB — POCT URINE PREGNANCY: Preg Test, Ur: NEGATIVE

## 2011-08-24 MED ORDER — HYDROCORTISONE-ACETIC ACID 1-2 % OT SOLN: 3.0000 [drp] | Freq: Two times a day (BID) | OTIC | Status: AC

## 2011-08-24 MED ORDER — MEDROXYPROGESTERONE ACETATE 150 MG/ML IM SUSP
150.0000 mg | Freq: Once | INTRAMUSCULAR | Status: AC
  Administered 2011-08-24: 150 mg via INTRAMUSCULAR

## 2011-08-24 NOTE — Assessment & Plan Note (Signed)
Will rx vosol hc given small abrasion in right ear and left canal just irrigated from impacted cotton tip for several months.  Advised no more qtip use.

## 2011-08-24 NOTE — Progress Notes (Signed)
Addended byArlyss Repress on: 08/24/2011 04:31 PM   Modules accepted: Orders

## 2011-08-24 NOTE — Progress Notes (Signed)
  Subjective:    Patient ID: Kathryn Fox, female    DOB: [birth year], 21 y.o.   MRN: [id number]  HPI Here for left ear qtip lost in ear.  Left ear- was cleaning ears and lost a qtip head in it.  Did not find the time to come until today.  No pain, drainage, hearing loss, or fever.    Would like to continue depo for contraception.  Last shot was in July.  Has not had sex since then.  Review of Systemssee hpi     Objective:   Physical Exam .GEN: NAD EARS:  Right TM with small abrasion in ear canal from qtip cleaning-painfulg.  Left TM with large impacted material.  After irrigation, no significant inflammation       Assessment & Plan:

## 2011-08-24 NOTE — Patient Instructions (Addendum)
Use ear drops for several days to prevent infection from scratches and abrasion in your ear canal  DO NOT EVER USE QTIPS!  Can use a few drops of hydrogen peroxide and let it drip out if needed to clear ears out  Keep your appointment with Dr. Louanne Belton to follow-up on neck pain.  Please call 1 day in advance if you cannot make it.

## 2011-09-06 ENCOUNTER — Encounter: Payer: Self-pay | Admitting: Family Medicine

## 2011-09-06 ENCOUNTER — Ambulatory Visit (INDEPENDENT_AMBULATORY_CARE_PROVIDER_SITE_OTHER): Payer: Self-pay | Admitting: Family Medicine

## 2011-09-06 DIAGNOSIS — G43909 Migraine, unspecified, not intractable, without status migrainosus: Secondary | ICD-10-CM

## 2011-09-06 MED ORDER — ISOMETHEPTENE-CAFFEINE-APAP 130-20-500 MG PO TABS: 1.0000 | ORAL_TABLET | Freq: Four times a day (QID) | ORAL | Status: DC | PRN

## 2011-09-06 NOTE — Patient Instructions (Signed)
It was good to see you today! I want you to sign a release of information for [place] to get the CT scan records. I want you to try taking this new medication up to every six hours.  We will see if that helps with your pain.

## 2011-09-06 NOTE — Progress Notes (Signed)
Subjective: The patient is a 21 y.o. year old female who presents today for neck pain/headaches.  Has continued to have problems with these since her last visit with me.  Has tried changing pillows, medications, and chiropractor.  Exercises by chiropractor have helped somewhat but never for long.  Pain on either side, radiating to the top of head.  Primarily at night/early morning.  Sometimes associated with blurry vision. No radiation down back.  Occasional nausea/emesis related to amount of pain.  Pt on period currently so not ready for pap.  Smoking status: quit about 6 months ago.  Objective:  Filed Vitals:   09/06/11 1506  BP: 119/75  Pulse: 85   Gen: NAD HEENT: EOMI, PERRL, no adenopathy, neck supple CV: RRR, no MRG Resp: CTABL Ext: No edema  Assessment/Plan:  Please also see individual problems in problem list for problem-specific plans.

## 2011-09-22 ENCOUNTER — Ambulatory Visit (INDEPENDENT_AMBULATORY_CARE_PROVIDER_SITE_OTHER): Payer: Self-pay | Admitting: Family Medicine

## 2011-09-22 ENCOUNTER — Encounter: Payer: Self-pay | Admitting: Family Medicine

## 2011-09-22 DIAGNOSIS — G43909 Migraine, unspecified, not intractable, without status migrainosus: Secondary | ICD-10-CM

## 2011-09-22 MED ORDER — METOPROLOL TARTRATE 50 MG PO TABS: 50.0000 mg | ORAL_TABLET | Freq: Two times a day (BID) | ORAL | Status: DC

## 2011-09-22 NOTE — Progress Notes (Signed)
  Subjective:    Patient ID: Kathryn Fox, female    DOB: [birth year], 21 y.o.   MRN: [id number]  HPIWork inappt.  Headache since age 32, worsened since 2008 (age16).  Workup by "general medical clinic" included a CT scan which she never heard results-presumed normal  Described as 8/10," like someone is twisting my next and shooting to my head and to the back of my eyes" usually one side a time. occurs daily.  Photosensitive.  Has taken 6-600 mg ibuprofen and 4 Excedrin in the past 48 hours.  Takes Excedrin daily.  Not menstrual in nature.  Has tried chiropractic care which helps some.  Sister has chronic migraines, takes topamax Review of SystemsNo numbness, tingling , weakness, vision changes     Objective:   Physical Exam GEN: Alert & Oriented, No acute distress        Assessment & Plan:

## 2011-09-22 NOTE — Patient Instructions (Signed)
Use metoprolol twice a day every day to help prevent headaches  Use Excedrin only for the most severe headaches- try to limit to twice a week or less  Keep headache log and bring to next appointment  See info on researching possible triggers and things you can do to be more healthy  Follow-up with Dr. Louanne Belton in 3-4 weeks

## 2011-09-22 NOTE — Assessment & Plan Note (Signed)
Chronic migraine with medication overuse.  Will start metoprolol 50 bid for prophylaxis, discussed and gave info on medication overuse- goal will be to decrease slowly down to no more than twice per week of excedrin which is what she feels is the most effective abortive tx for her.  Gave her instructions and a log to fill out to identify triggers she can modify behaviroally.  She has already begun thinking about oversleeping as a possible one.  Will follow-up with her PCP in 3-4 weeks to discuss further.

## 2011-09-25 NOTE — Assessment & Plan Note (Signed)
Will try Prodrin for headache and attempt to get records of CT scan.  RTC 2-3 weeks

## 2011-10-03 ENCOUNTER — Ambulatory Visit: Payer: Self-pay | Admitting: Family Medicine

## 2011-10-05 ENCOUNTER — Ambulatory Visit (INDEPENDENT_AMBULATORY_CARE_PROVIDER_SITE_OTHER): Payer: Self-pay | Admitting: Family Medicine

## 2011-10-05 DIAGNOSIS — G43009 Migraine without aura, not intractable, without status migrainosus: Secondary | ICD-10-CM

## 2011-10-05 NOTE — Progress Notes (Signed)
Pt thought that she had scheduled the appt for today to have her annual exam done. I told her that this was a follow up for her headache and that she was to follow up with her pcp regarding this. Told pt that there would be no charge for this visit today and for her to reschedule with her pcp. Pt voiced understanding and will call back to make appt.Loralee Pacas Hartman

## 2011-11-13 ENCOUNTER — Ambulatory Visit: Payer: Self-pay

## 2011-11-14 ENCOUNTER — Ambulatory Visit (INDEPENDENT_AMBULATORY_CARE_PROVIDER_SITE_OTHER): Payer: Self-pay | Admitting: *Deleted

## 2011-11-14 DIAGNOSIS — Z309 Encounter for contraceptive management, unspecified: Secondary | ICD-10-CM

## 2011-11-14 MED ORDER — MEDROXYPROGESTERONE ACETATE 150 MG/ML IM SUSP
150.0000 mg | Freq: Once | INTRAMUSCULAR | Status: AC
  Administered 2011-11-14: 150 mg via INTRAMUSCULAR

## 2011-12-23 ENCOUNTER — Encounter (HOSPITAL_COMMUNITY): Payer: Self-pay | Admitting: Emergency Medicine

## 2011-12-23 ENCOUNTER — Emergency Department (HOSPITAL_COMMUNITY)
Admission: EM | Admit: 2011-12-23 | Discharge: 2011-12-23 | Disposition: A | Discharge status: Home / Self Care | Payer: Self-pay | Attending: Emergency Medicine | Admitting: Emergency Medicine

## 2011-12-23 DIAGNOSIS — T3795XA Adverse effect of unspecified systemic anti-infective and antiparasitic, initial encounter: Secondary | ICD-10-CM | POA: Insufficient documentation

## 2011-12-23 DIAGNOSIS — T7840XA Allergy, unspecified, initial encounter: Secondary | ICD-10-CM

## 2011-12-23 DIAGNOSIS — L500 Allergic urticaria: Secondary | ICD-10-CM | POA: Insufficient documentation

## 2011-12-23 MED ORDER — FAMOTIDINE IN NACL 20-0.9 MG/50ML-% IV SOLN
20.0000 mg | Freq: Once | INTRAVENOUS | Status: AC
  Administered 2011-12-23: 20 mg via INTRAVENOUS
  Filled 2011-12-23: qty 50

## 2011-12-23 MED ORDER — METHYLPREDNISOLONE SODIUM SUCC 125 MG IJ SOLR
125.0000 mg | Freq: Once | INTRAMUSCULAR | Status: AC
  Administered 2011-12-23: 125 mg via INTRAVENOUS
  Filled 2011-12-23: qty 2

## 2011-12-23 MED ORDER — PREDNISONE 10 MG PO TABS: 20.0000 mg | ORAL_TABLET | Freq: Every day | ORAL | Status: DC

## 2011-12-23 MED ORDER — FAMOTIDINE 20 MG PO TABS: 20.0000 mg | ORAL_TABLET | Freq: Two times a day (BID) | ORAL | Status: DC

## 2011-12-23 NOTE — ED Notes (Signed)
Pt BIB EMS. Pt took Septa DS two hrs PTA. Pt took sister's med. Pt developed hives and itching from waist up. No acute distress. Pt speaks in clear sentences. Pt given Benadryl 50mg  PO per EMS and Zantac 50mg  IVPB.

## 2011-12-23 NOTE — ED Provider Notes (Signed)
Medical screening examination/treatment/procedure(s) were performed by non-physician practitioner and as supervising physician I was immediately available for consultation/collaboration. Devoria Albe, MD, Armando Gang   Ward Givens, MD 12/23/11 [phone]

## 2011-12-23 NOTE — ED Notes (Signed)
[id number]<AV> Expected date:12/23/11<BR> Expected time: 4:14 AM<BR> Means of arrival:Ambulance<BR> Comments:<BR> Allergic reaction/Hives, face swelling

## 2011-12-23 NOTE — Discharge Instructions (Signed)
Allergic Reaction  Allergic reactions can be caused by anything your body is sensitive to. Your body may be sensitive to food, medicines, molds, pollens, cockroaches, dust mites, pets, insect stings, and other things around you. An allergic reaction may cause puffiness (swelling), itching, sneezing, coughing, or problems breathing.   Allergies cannot be cured, but they can be controlled with medicine. Some allergies happen only at certain times of the year. Try to stay away from what causes your reaction if possible. Sometimes, it is hard to tell what causes your reaction.  HOME CARE  If you have a rash or red patches (hives) on your skin:   Take medicines as told by your doctor.   Do not drive or drink alcohol after taking medicines. They can make you sleepy.   Put cold cloths on your skin. Take baths in cool water. This will help your itching. Do not take hot baths or showers. Heat will make the itching worse.   If your allergies get worse, your doctor might give you other medicines. Talk to your doctor if problems continue.  GET HELP RIGHT AWAY IF:    You have trouble breathing.   You have a tight feeling in your chest or throat.   Your mouth gets puffy (swollen).   You have red, itchy patches on your skin (hives) that get worse.   You have itching all over your body.  MAKE SURE YOU:    Understand these instructions.   Will watch your condition.   Will get help right away if you are not doing well or get worse.  Document Released: 06/28/2009 Document Revised: 06/29/2011 Document Reviewed: 06/28/2009  Exit[employer] Patient Information 2012 [employer].

## 2011-12-23 NOTE — ED Notes (Signed)
NP Schulz at bedside. 

## 2011-12-23 NOTE — ED Provider Notes (Signed)
History     CSN: [id number]  Arrival date & time 12/23/11  0417   First MD Initiated Contact with Patient 12/23/11 0423      Chief Complaint  Patient presents with  . Allergic Reaction  . Urticaria    (Consider location/radiation/quality/duration/timing/severity/associated sxs/prior treatment) HPI Comments: Patient was given 2 Septra tablets by her mother for urinary symptoms, dysuria, approximately 30 minutes later, she developed generalized flushing, and pure.  This.  No respiratory, shortness of breath, throat, swelling.  Mother called EMS who administered 50 mg of Benadryl and 50 mg of Zantac.  She is still having some generalized itching  Patient is a 21 y.o. female presenting with allergic reaction and urticaria. The history is provided by the patient and a parent.  Allergic Reaction The primary symptoms are  rash and urticaria. The primary symptoms do not include shortness of breath, nausea, vomiting, diarrhea, dizziness, palpitations or angioedema.  Urticaria Associated symptoms include a rash. Pertinent negatives include no fever, joint swelling, nausea, vomiting or weakness.    Past Medical History  Diagnosis Date  . Chronic abdominal pain     Past Surgical History  Procedure Date  . Breast fibroadenoma surgery     Family History  Problem Relation Age of Onset  . Hypertension Mother   . Hyperlipidemia Mother   . Diabetes Mother   . Cancer Maternal Aunt     Breast  . Diabetes Maternal Aunt   . Stroke Maternal Grandmother   . Cancer Paternal Grandfather     History  Substance Use Topics  . Smoking status: Former Smoker -- 0.2 packs/day    Types: Cigarettes  . Smokeless tobacco: Not on file  . Alcohol Use: No    OB History    Grav Para Term Preterm Abortions TAB SAB Ect Mult Living                  Review of Systems  Constitutional: Negative for fever.  HENT: Negative for facial swelling and trouble swallowing.   Respiratory: Negative for  shortness of breath.   Cardiovascular: Negative for palpitations.  Gastrointestinal: Negative for nausea, vomiting and diarrhea.  Genitourinary: Positive for dysuria. Negative for vaginal discharge.  Musculoskeletal: Negative for joint swelling.  Skin: Positive for rash. Negative for pallor.  Neurological: Negative for dizziness and weakness.    Allergies  Milk-related compounds and Peanut-containing drug products  Home Medications   Current Outpatient Rx  Name Route Sig Dispense Refill  . ASPIRIN-ACETAMINOPHEN-CAFFEINE 250-250-65 MG PO TABS Oral Take 1 tablet by mouth every 6 (six) hours as needed.    Marland Kitchen MEDROXYPROGESTERONE ACETATE 150 MG/ML IM SUSP Intramuscular Inject 150 mg into the muscle every 3 (three) months.      Marland Kitchen METOPROLOL TARTRATE 50 MG PO TABS Oral Take 1 tablet (50 mg total) by mouth 2 (two) times daily. 60 tablet 1    BP 124/63  Pulse 93  Temp(Src) 99.3 F (37.4 C) (Oral)  Resp 22  SpO2 100%  Physical Exam  Constitutional: She is oriented to person, place, and time. She appears well-developed and well-nourished.  HENT:  Head: Normocephalic.  Eyes: Pupils are equal, round, and reactive to light.  Neck: Normal range of motion.  Cardiovascular: Normal rate.   Pulmonary/Chest: Effort normal and breath sounds normal. No respiratory distress. She has no wheezes.  Musculoskeletal: Normal range of motion.  Neurological: She is alert and oriented to person, place, and time.  Skin: Skin is warm. No rash noted. There is  erythema.    ED Course  Procedures (including critical care time)   Labs Reviewed  URINALYSIS, ROUTINE W REFLEX MICROSCOPIC   No results found.   No diagnosis found.    MDM  Allergic reaction, most likely to Septra         Arman Filter, NP 12/23/11 [phone]

## 2011-12-23 NOTE — ED Notes (Signed)
Pt speaks in clear, complete sentences. Pt states she is able to swallow her own saliva.

## 2011-12-27 ENCOUNTER — Ambulatory Visit: Payer: Self-pay | Admitting: Family Medicine

## 2011-12-28 ENCOUNTER — Encounter: Payer: Self-pay | Admitting: Family Medicine

## 2011-12-28 ENCOUNTER — Other Ambulatory Visit (HOSPITAL_COMMUNITY)
Admission: RE | Admit: 2011-12-28 | Discharge: 2011-12-28 | Disposition: A | Discharge status: Home / Self Care | Payer: Self-pay | Source: Ambulatory Visit | Attending: Family Medicine | Admitting: Family Medicine

## 2011-12-28 ENCOUNTER — Ambulatory Visit (INDEPENDENT_AMBULATORY_CARE_PROVIDER_SITE_OTHER): Payer: Self-pay | Admitting: Family Medicine

## 2011-12-28 VITALS — BP 114/80 | HR 94 | Temp 98.4°F | Ht 64.0 in | Wt 118.0 lb

## 2011-12-28 DIAGNOSIS — A499 Bacterial infection, unspecified: Secondary | ICD-10-CM

## 2011-12-28 DIAGNOSIS — N760 Acute vaginitis: Secondary | ICD-10-CM

## 2011-12-28 DIAGNOSIS — Z113 Encounter for screening for infections with a predominantly sexual mode of transmission: Secondary | ICD-10-CM | POA: Insufficient documentation

## 2011-12-28 DIAGNOSIS — R300 Dysuria: Secondary | ICD-10-CM

## 2011-12-28 DIAGNOSIS — B9689 Other specified bacterial agents as the cause of diseases classified elsewhere: Secondary | ICD-10-CM

## 2011-12-28 LAB — POCT WET PREP (WET MOUNT)

## 2011-12-28 LAB — POCT UA - MICROSCOPIC ONLY

## 2011-12-28 LAB — POCT URINALYSIS DIPSTICK
Bilirubin, UA: NEGATIVE
Glucose, UA: NEGATIVE
Leukocytes, UA: NEGATIVE
Nitrite, UA: NEGATIVE
pH, UA: 6

## 2011-12-28 MED ORDER — METRONIDAZOLE 500 MG PO TABS: 500.0000 mg | ORAL_TABLET | Freq: Two times a day (BID) | ORAL | Status: AC

## 2011-12-28 NOTE — Assessment & Plan Note (Addendum)
Rx for flagyl for one week.  Patient education given verbally and written.  Follow up if no improvement.  Urine not indicative of UTI.
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
## 2011-12-28 NOTE — Patient Instructions (Signed)
Bacterial Vaginosis Bacterial vaginosis is an infection of the vagina. A healthy vagina has many kinds of good germs (bacteria). Sometimes the number of good germs can change. This allows bad germs to move in and cause an infection. You may be given medicine (antibiotics) to treat the infection. Or, you may not need treatment at all. HOME CARE  Take your medicine as told. Finish them even if you start to feel better.   Do not have sex until you finish your medicine.   Do not douche.   Practice safe sex.   Tell your sex partner that you have an infection. They should see their doctor for treatment if they have problems.  GET HELP RIGHT AWAY IF:  You do not get better after 3 days of treatment.   You have grey fluid (discharge) coming from your vagina.   You have pain.   You have a temperature of 102 F (38.9 C) or higher.  MAKE SURE YOU:   Understand these instructions.   Will watch your condition.   Will get help right away if you are not doing well or get worse.  Document Released: 04/18/2008 Document Revised: 06/29/2011 Document Reviewed: 04/18/2008 Exit[employer] Patient Information 2012 [employer]. 

## 2012-01-01 ENCOUNTER — Ambulatory Visit: Payer: Self-pay | Admitting: Family Medicine

## 2012-01-01 ENCOUNTER — Encounter: Payer: Self-pay | Admitting: Family Medicine

## 2012-01-15 ENCOUNTER — Ambulatory Visit (INDEPENDENT_AMBULATORY_CARE_PROVIDER_SITE_OTHER): Payer: Self-pay | Admitting: Family Medicine

## 2012-01-15 VITALS — BP 125/74

## 2012-01-15 DIAGNOSIS — M542 Cervicalgia: Secondary | ICD-10-CM

## 2012-01-15 DIAGNOSIS — G43909 Migraine, unspecified, not intractable, without status migrainosus: Secondary | ICD-10-CM

## 2012-01-15 DIAGNOSIS — C801 Malignant (primary) neoplasm, unspecified: Secondary | ICD-10-CM | POA: Insufficient documentation

## 2012-01-15 MED ORDER — CYCLOBENZAPRINE HCL 5 MG PO TABS: 5.0000 mg | ORAL_TABLET | Freq: Every day | ORAL | Status: AC | PRN
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
## 2012-01-16 NOTE — Assessment & Plan Note (Signed)
Chronic neck pain that is a trigger for her migraines. Reviewed imaging which shows loss of normal cervical curvature (consistent w chronic spasm). Will set up PT---use flexeril prn esp on days she attends PT. Discussed etiology and natural course. Reviewed her films with her. rtc 4-5 weeks

## 2012-01-30 ENCOUNTER — Ambulatory Visit: Payer: Self-pay

## 2012-01-30 ENCOUNTER — Ambulatory Visit: Payer: Self-pay | Attending: Family Medicine

## 2012-01-30 DIAGNOSIS — M542 Cervicalgia: Secondary | ICD-10-CM | POA: Insufficient documentation

## 2012-01-30 DIAGNOSIS — IMO0001 Reserved for inherently not codable concepts without codable children: Secondary | ICD-10-CM | POA: Insufficient documentation

## 2012-02-08 ENCOUNTER — Ambulatory Visit: Payer: Self-pay | Admitting: Rehabilitation

## 2012-02-09 ENCOUNTER — Encounter: Payer: Self-pay | Admitting: Rehabilitation

## 2012-02-12 ENCOUNTER — Encounter: Payer: Self-pay | Admitting: Rehabilitative and Restorative Service Providers"

## 2012-02-15 ENCOUNTER — Ambulatory Visit: Payer: Self-pay | Admitting: Rehabilitation

## 2012-02-15 ENCOUNTER — Encounter (HOSPITAL_COMMUNITY): Payer: Self-pay

## 2012-02-15 ENCOUNTER — Emergency Department (HOSPITAL_COMMUNITY)
Admission: EM | Admit: 2012-02-15 | Discharge: 2012-02-15 | Disposition: A | Discharge status: Home / Self Care | Payer: Self-pay | Attending: Emergency Medicine | Admitting: Emergency Medicine

## 2012-02-15 DIAGNOSIS — G8929 Other chronic pain: Secondary | ICD-10-CM | POA: Insufficient documentation

## 2012-02-15 DIAGNOSIS — Z87891 Personal history of nicotine dependence: Secondary | ICD-10-CM | POA: Insufficient documentation

## 2012-02-15 DIAGNOSIS — A499 Bacterial infection, unspecified: Secondary | ICD-10-CM | POA: Insufficient documentation

## 2012-02-15 DIAGNOSIS — B9689 Other specified bacterial agents as the cause of diseases classified elsewhere: Secondary | ICD-10-CM | POA: Insufficient documentation

## 2012-02-15 DIAGNOSIS — N760 Acute vaginitis: Secondary | ICD-10-CM | POA: Insufficient documentation

## 2012-02-15 DIAGNOSIS — N12 Tubulo-interstitial nephritis, not specified as acute or chronic: Secondary | ICD-10-CM | POA: Insufficient documentation

## 2012-02-15 LAB — URINE MICROSCOPIC-ADD ON

## 2012-02-15 LAB — URINALYSIS, ROUTINE W REFLEX MICROSCOPIC
Bilirubin Urine: NEGATIVE
Glucose, UA: NEGATIVE mg/dL
Ketones, ur: 15 mg/dL — AB
Protein, ur: NEGATIVE mg/dL
pH: 6.5 (ref 5.0–8.0)

## 2012-02-15 MED ORDER — METRONIDAZOLE 500 MG PO TABS: 500.0000 mg | ORAL_TABLET | Freq: Two times a day (BID) | ORAL | Status: AC

## 2012-02-15 MED ORDER — KETOROLAC TROMETHAMINE 30 MG/ML IJ SOLN
30.0000 mg | Freq: Once | INTRAMUSCULAR | Status: AC
  Administered 2012-02-15: 30 mg via INTRAMUSCULAR

## 2012-02-15 MED ORDER — CIPROFLOXACIN HCL 500 MG PO TABS: 500.0000 mg | ORAL_TABLET | Freq: Two times a day (BID) | ORAL | Status: AC

## 2012-02-15 MED ORDER — ONDANSETRON HCL 4 MG PO TABS: 4.0000 mg | ORAL_TABLET | Freq: Three times a day (TID) | ORAL | Status: AC | PRN

## 2012-02-15 MED ORDER — ACETAMINOPHEN 500 MG PO TABS
1000.0000 mg | ORAL_TABLET | Freq: Once | ORAL | Status: AC
  Administered 2012-02-15: 1000 mg via ORAL
  Filled 2012-02-15: qty 2

## 2012-02-15 MED ORDER — KETOROLAC TROMETHAMINE 30 MG/ML IJ SOLN
30.0000 mg | Freq: Once | INTRAMUSCULAR | Status: DC
  Filled 2012-02-15: qty 1

## 2012-02-15 MED ORDER — OXYCODONE-ACETAMINOPHEN 5-325 MG PO TABS: ORAL_TABLET | ORAL | Status: AC

## 2012-02-15 NOTE — ED Provider Notes (Signed)
History     CSN: [id number]  Arrival date & time 02/15/12  0730   First MD Initiated Contact with Patient 02/15/12 [phone]      Chief Complaint  Patient presents with  . Abdominal Pain    (Consider location/radiation/quality/duration/timing/severity/associated sxs/prior treatment) HPI  21 y/o female INAD c/o dysuria, bilateral lower abdominal pain and low back pain x10 days.  Pain 8/10. Pt had anal intercourse followed immediately by vaginal intercourse x10 days ago. Denies Vaginal discharge or rash.  Pt vomited 2x x4 days ago, now tolerating PO. She reports resolved subjective fever x3 days ago. Pt has irregular periods, she stopped taking depo shot this month, last shot given 6/9.    Past Medical History  Diagnosis Date  . Chronic abdominal pain     Past Surgical History  Procedure Date  . Breast fibroadenoma surgery     Family History  Problem Relation Age of Onset  . Hypertension Mother   . Hyperlipidemia Mother   . Diabetes Mother   . Cancer Maternal Aunt     Breast  . Diabetes Maternal Aunt   . Stroke Maternal Grandmother   . Cancer Paternal Grandfather     History  Substance Use Topics  . Smoking status: Former Smoker -- 0.2 packs/day    Types: Cigarettes  . Smokeless tobacco: Not on file  . Alcohol Use: No    OB History    Grav Para Term Preterm Abortions TAB SAB Ect Mult Living                  Review of Systems  Constitutional: Positive for fever.  Cardiovascular: Negative for palpitations.  Gastrointestinal: Positive for vomiting.  Genitourinary: Positive for dysuria and flank pain. Negative for vaginal discharge.  Skin: Negative for rash.  All other systems reviewed and are negative.    Allergies  Milk-related compounds; Peanut-containing drug products; and Septra  Home Medications  No current outpatient prescriptions on file.  BP 120/68  Pulse 85  Temp 97.7 F (36.5 C) (Oral)  Resp 18  Ht 5\' 4"  (1.626 m)  Wt 118 lb (53.524 kg)   BMI 20.25 kg/m2  SpO2 98%  Physical Exam  Nursing note and vitals reviewed. Constitutional: She is oriented to person, place, and time. She appears well-developed and well-nourished. No distress.  HENT:  Head: Normocephalic.  Eyes: Conjunctivae and EOM are normal.  Neck: Normal range of motion.  Cardiovascular: Normal rate and regular rhythm.   Pulmonary/Chest: Effort normal.  Abdominal: Soft. Bowel sounds are normal. She exhibits no distension and no mass. There is tenderness. There is no rebound and no guarding.       Mild left lower quadrant tenderness  Genitourinary: Uterus normal. There is no rash, tenderness or lesion on the right labia. There is no rash, tenderness, lesion or injury on the left labia. Uterus is not enlarged and not tender. Cervix exhibits no motion tenderness, no discharge and no friability. Right adnexum displays no mass, no tenderness and no fullness. Left adnexum displays no mass, no tenderness and no fullness. No tenderness around the vagina. No vaginal discharge found.       Mild left costovertebral angle tenderness  Musculoskeletal: Normal range of motion.  Neurological: She is alert and oriented to person, place, and time.  Skin: Skin is warm and dry.  Psychiatric: She has a normal mood and affect.    ED Course  Procedures (including critical care time)  Labs Reviewed  WET PREP, GENITAL - Abnormal;  Notable for the following:    Clue Cells Wet Prep HPF POC FEW (*)     All other components within normal limits  URINALYSIS, ROUTINE W REFLEX MICROSCOPIC - Abnormal; Notable for the following:    APPearance CLOUDY (*)     Hgb urine dipstick LARGE (*)     Ketones, ur 15 (*)     Leukocytes, UA LARGE (*)     All other components within normal limits  URINE MICROSCOPIC-ADD ON - Abnormal; Notable for the following:    Squamous Epithelial / LPF FEW (*)     Bacteria, UA MANY (*)  CHECKED   All other components within normal limits  POCT PREGNANCY, URINE    GC/CHLAMYDIA PROBE AMP, GENITAL   No results found.   1. Pyelonephritis   2. BV (bacterial vaginosis)       MDM  Concern for UTI ascending to pyelonephritis. I will also perform pelvic exam. Pelvic exam clinically normal. WEt prep and GC/Chlam pending.  Pain control and options limited by the fact that she is driving.  Gave tylenol and Toradol IM  UA shows many bacteria with leukocytes and blood. Few Clue cells seen on wet prep. Will treat for pyelonephritis and bacterial vaginosis.   Pt verbalized understanding and agrees with care plan. Outpatient follow-up and return precautions given.          Wynetta Emery, PA-C 02/15/12 1041

## 2012-02-15 NOTE — ED Provider Notes (Signed)
Medical screening examination/treatment/procedure(s) were performed by non-physician practitioner and as supervising physician I was immediately available for consultation/collaboration.   Glynn Octave, MD 02/15/12 [phone]

## 2012-02-15 NOTE — ED Notes (Signed)
Pt here by self, c/o bilateral lower back pain, abd pain, and burning with urination. The back and abd pain has been going on for a little over a week. The burning with urination started a day ago. Was suppose to receive the Depo injection on 01/30/12 but has not gotten in. unk LMP.

## 2012-02-15 NOTE — ED Notes (Signed)
Nicole, PA at bedside 

## 2012-02-16 LAB — GC/CHLAMYDIA PROBE AMP, GENITAL: GC Probe Amp, Genital: NEGATIVE

## 2012-02-19 ENCOUNTER — Ambulatory Visit: Payer: Self-pay | Admitting: Family Medicine

## 2012-02-19 LAB — URINE CULTURE

## 2012-02-20 NOTE — ED Notes (Signed)
+   Urine Patient treated with Cipro-sensitive to same

## 2012-02-27 ENCOUNTER — Ambulatory Visit: Payer: Self-pay | Admitting: Rehabilitative and Restorative Service Providers"

## 2012-06-13 ENCOUNTER — Encounter: Payer: Self-pay | Admitting: Internal Medicine

## 2012-06-14 ENCOUNTER — Encounter: Payer: Self-pay | Admitting: Family Medicine

## 2012-06-14 ENCOUNTER — Other Ambulatory Visit (HOSPITAL_COMMUNITY)
Admission: RE | Admit: 2012-06-14 | Discharge: 2012-06-14 | Disposition: A | Discharge status: Home / Self Care | Payer: BC Managed Care – HMO | Source: Ambulatory Visit | Attending: Family Medicine | Admitting: Family Medicine

## 2012-06-14 ENCOUNTER — Ambulatory Visit (INDEPENDENT_AMBULATORY_CARE_PROVIDER_SITE_OTHER): Payer: Self-pay | Admitting: Family Medicine

## 2012-06-14 VITALS — BP 118/77 | HR 86 | Temp 98.9°F | Ht 64.0 in | Wt 108.0 lb

## 2012-06-14 DIAGNOSIS — R634 Abnormal weight loss: Secondary | ICD-10-CM | POA: Insufficient documentation

## 2012-06-14 DIAGNOSIS — R358 Other polyuria: Secondary | ICD-10-CM

## 2012-06-14 DIAGNOSIS — R109 Unspecified abdominal pain: Secondary | ICD-10-CM

## 2012-06-14 DIAGNOSIS — N760 Acute vaginitis: Secondary | ICD-10-CM

## 2012-06-14 DIAGNOSIS — Z113 Encounter for screening for infections with a predominantly sexual mode of transmission: Secondary | ICD-10-CM | POA: Insufficient documentation

## 2012-06-14 LAB — POCT URINALYSIS DIPSTICK
Bilirubin, UA: NEGATIVE
Glucose, UA: NEGATIVE
Ketones, UA: NEGATIVE
Spec Grav, UA: >=1.03

## 2012-06-14 LAB — COMPREHENSIVE METABOLIC PANEL
ALT: <8 U/L (ref 0–35)
AST: 7 U/L (ref 0–37)
Calcium: 9.1 mg/dL (ref 8.4–10.5)
Chloride: 105 mEq/L (ref 96–112)
Creat: 0.74 mg/dL (ref 0.50–1.10)
Sodium: 138 mEq/L (ref 135–145)
Total Protein: 6.4 g/dL (ref 6.0–8.3)

## 2012-06-14 LAB — CBC WITH DIFFERENTIAL/PLATELET
Basophils Absolute: 0 10*3/uL (ref 0.0–0.1)
Eosinophils Relative: 2 % (ref 0–5)
Lymphocytes Relative: 32 % (ref 12–46)
MCV: 84.5 fL (ref 78.0–100.0)
Neutro Abs: 4.4 10*3/uL (ref 1.7–7.7)
Neutrophils Relative %: 61 % (ref 43–77)
Platelets: 272 10*3/uL (ref 150–400)
RBC: 4.13 MIL/uL (ref 3.87–5.11)
RDW: 13.4 % (ref 11.5–15.5)
WBC: 7.2 10*3/uL (ref 4.0–10.5)

## 2012-06-14 LAB — POCT UA - MICROSCOPIC ONLY: RBC, urine, microscopic: >20

## 2012-06-14 LAB — POCT WET PREP (WET MOUNT): Clue Cells Wet Prep Whiff POC: NEGATIVE

## 2012-06-14 LAB — POCT URINE PREGNANCY: Preg Test, Ur: NEGATIVE

## 2012-06-14 NOTE — Patient Instructions (Signed)
It was good to see you today. We will be in touch about your results.

## 2012-06-14 NOTE — Progress Notes (Signed)
Patient ID: Kathryn Fox, female   DOB: 10-11-1990, 21 y.o.   MRN: [id number] Subjective: The patient is a 21 y.o. year old female who presents today for weight loss.  Patient reports that over the last month and a half she has had about a 22 pound weight loss over the last 6 weeks.  This has been completely unintentional.  No change in eating habits.  Takes no medications. Around the same time started having problems with abdominal pain, back pain, and pelvic pain.  Pain is intermittent but will be as bad as 9/10.  When present, pain is sharp and stabbing.  Patient stopped taking depo about 4 months ago.  Patient has not had periods since then.  Some white discharge.  Last intercourse was in September and at that time was not particularly painful.  Patient denies fevers/chills, no headaches, No significant problems with nausea/vomiting.  Does have some diarrhea (more loose stools than true diarrhea as happens about every other day).  No dysuria.  No rashes.  Patient reports family history of breast cancer in 23s.  Family history of early diabetes (sister got at 65).  Also family history of ovarian cancer in mother in 51s.  Patient's past medical, social, and family history were reviewed and updated as appropriate. History  Substance Use Topics  . Smoking status: Former Smoker -- 0.2 packs/day    Types: Cigarettes  . Smokeless tobacco: Not on file  . Alcohol Use: No   Objective:  Filed Vitals:   06/14/12 1118  BP: 118/77  Pulse: 86  Temp: 98.9 F (37.2 C)   Gen: NAD CV: RRR Resp: CTABL Abd: Soft, non-distended.  Mild bilaterally lower abd tenderness without guarding or rebound Pelvic exam: VULVA: normal appearing vulva with no masses, tenderness or lesions, VAGINA: normal appearing vagina with normal color and discharge, no lesions, CERVIX: normal appearing cervix without discharge or lesions, exam limited by patient discomfort.  There is fullness of the adnexa on the right.  Patient  does not have CMT but does have generalized lower abd pain. Ext: No edema  Assessment/Plan:  Please also see individual problems in problem list for problem-specific plans.

## 2012-06-15 ENCOUNTER — Emergency Department (HOSPITAL_COMMUNITY)
Admission: EM | Admit: 2012-06-15 | Discharge: 2012-06-16 | Disposition: A | Discharge status: Home / Self Care | Payer: BC Managed Care – HMO | Attending: Emergency Medicine | Admitting: Emergency Medicine

## 2012-06-15 ENCOUNTER — Emergency Department (HOSPITAL_COMMUNITY): Payer: BC Managed Care – HMO

## 2012-06-15 ENCOUNTER — Encounter (HOSPITAL_COMMUNITY): Payer: Self-pay | Admitting: *Deleted

## 2012-06-15 DIAGNOSIS — R1031 Right lower quadrant pain: Secondary | ICD-10-CM | POA: Insufficient documentation

## 2012-06-15 DIAGNOSIS — Z87891 Personal history of nicotine dependence: Secondary | ICD-10-CM | POA: Insufficient documentation

## 2012-06-15 DIAGNOSIS — Z3202 Encounter for pregnancy test, result negative: Secondary | ICD-10-CM | POA: Insufficient documentation

## 2012-06-15 LAB — CBC WITH DIFFERENTIAL/PLATELET
Basophils Absolute: 0 10*3/uL (ref 0.0–0.1)
Basophils Relative: 0 % (ref 0–1)
Eosinophils Absolute: 0.2 10*3/uL (ref 0.0–0.7)
Eosinophils Relative: 2 % (ref 0–5)
HCT: 35.6 % — ABNORMAL LOW (ref 36.0–46.0)
MCHC: 33.7 g/dL (ref 30.0–36.0)
MCV: 83.6 fL (ref 78.0–100.0)
Monocytes Absolute: 0.4 10*3/uL (ref 0.1–1.0)
RDW: 13.1 % (ref 11.5–15.5)

## 2012-06-15 LAB — BASIC METABOLIC PANEL
Calcium: 9.1 mg/dL (ref 8.4–10.5)
Creatinine, Ser: 0.74 mg/dL (ref 0.50–1.10)
GFR calc Af Amer: >90 mL/min (ref >90)

## 2012-06-15 LAB — URINE MICROSCOPIC-ADD ON

## 2012-06-15 LAB — URINALYSIS, ROUTINE W REFLEX MICROSCOPIC
Glucose, UA: NEGATIVE mg/dL
Ketones, ur: NEGATIVE mg/dL
Leukocytes, UA: NEGATIVE
Nitrite: NEGATIVE
Protein, ur: NEGATIVE mg/dL

## 2012-06-15 LAB — WET PREP, GENITAL

## 2012-06-15 LAB — RPR

## 2012-06-15 MED ORDER — SODIUM CHLORIDE 0.9 % IV SOLN
Freq: Once | INTRAVENOUS | Status: AC
  Administered 2012-06-15: 20 mL/h via INTRAVENOUS

## 2012-06-15 MED ORDER — MORPHINE SULFATE 4 MG/ML IJ SOLN
2.0000 mg | Freq: Once | INTRAMUSCULAR | Status: AC
  Administered 2012-06-15: 2 mg via INTRAVENOUS
  Filled 2012-06-15: qty 1

## 2012-06-15 NOTE — ED Provider Notes (Signed)
History     CSN: [id number]  Arrival date & time 06/15/12  1949   First MD Initiated Contact with Patient 06/15/12 2029      Chief Complaint  Patient presents with  . Pelvic Pain    (Consider location/radiation/quality/duration/timing/severity/associated sxs/prior treatment) HPI Comments: Patient is a 21 year old female who presents with a few month history of abdominal pain. The pain is located in her right lower quadrant and does radiate to the rest of her lower abdomen. The pain is described as sharp and aching and severe. The pain started gradually and progressively worsened since the onset. No alleviating/aggravating factors. The patient has tried nothing for symptoms without relief. Associated symptoms include weight loss and night sweats. Patient denies fever, headache, NVD, chest pain, SOB, dysuria, constipation, abnormal vaginal bleeding/discharge. Patient uses Depo shots for birth control and her last shot was 4 months ago. Her LMP was 11 months ago. Patient has an extensive family history of uterine, ovarian, and breat cancer. Patient has a personal history of breast fibroadenoma.      Past Medical History  Diagnosis Date  . Chronic abdominal pain     Past Surgical History  Procedure Date  . Breast fibroadenoma surgery     Family History  Problem Relation Age of Onset  . Hypertension Mother   . Hyperlipidemia Mother   . Diabetes Mother   . Cancer Maternal Aunt     Breast  . Diabetes Maternal Aunt   . Stroke Maternal Grandmother   . Cancer Paternal Grandfather     History  Substance Use Topics  . Smoking status: Former Smoker -- 0.2 packs/day    Types: Cigarettes  . Smokeless tobacco: Not on file  . Alcohol Use: No    OB History    Grav Para Term Preterm Abortions TAB SAB Ect Mult Living                  Review of Systems  Gastrointestinal: Positive for abdominal pain.  Genitourinary: Positive for pelvic pain.  All other systems reviewed and are  negative.    Allergies  Milk-related compounds; Peanut-containing drug products; and Septra  Home Medications  No current outpatient prescriptions on file.  BP 103/68  Pulse 77  Temp 98.8 F (37.1 C) (Oral)  Resp 18  SpO2 100%  LMP 01/14/2012  Physical Exam  Nursing note and vitals reviewed. Constitutional: She is oriented to person, place, and time. She appears well-developed and well-nourished. No distress.  HENT:  Head: Normocephalic and atraumatic.  Eyes: Conjunctivae normal and EOM are normal. Pupils are equal, round, and reactive to light. No scleral icterus.  Neck: Normal range of motion. Neck supple.  Cardiovascular: Normal rate and regular rhythm.  Exam reveals no gallop and no friction rub.   No murmur heard. Pulmonary/Chest: Effort normal and breath sounds normal. She has no wheezes. She has no rales. She exhibits no tenderness.  Abdominal: Soft. She exhibits distension. There is tenderness.       Mild lower abdominal distention. Generalized abdominal tenderness to palpation most notable in the RLQ.   Musculoskeletal: Normal range of motion.  Neurological: She is alert and oriented to person, place, and time. Coordination normal.       Speech is goal-oriented. Moves limbs without ataxia.   Skin: Skin is warm and dry. She is not diaphoretic.  Psychiatric: She has a normal mood and affect. Her behavior is normal.    ED Course  Procedures (including critical care time)  Labs Reviewed  CBC WITH DIFFERENTIAL - Abnormal; Notable for the following:    HCT 35.6 (*)     All other components within normal limits  URINALYSIS, ROUTINE W REFLEX MICROSCOPIC - Abnormal; Notable for the following:    Hgb urine dipstick SMALL (*)     All other components within normal limits  WET PREP, GENITAL - Abnormal; Notable for the following:    WBC, Wet Prep HPF POC RARE (*)     All other components within normal limits  URINE MICROSCOPIC-ADD ON - Abnormal; Notable for the  following:    Squamous Epithelial / LPF FEW (*)     All other components within normal limits  BASIC METABOLIC PANEL  PREGNANCY, URINE  GC/CHLAMYDIA PROBE AMP   US Transvaginal Non-ob  06/15/2012  *RADIOLOGY REPORT*  Clinical Data: Right lower quadrant pain for 1 month.  TRANSABDOMINAL AND TRANSVAGINAL ULTRASOUND OF PELVIS Technique:  Both transabdominal and transvaginal ultrasound examinations of the pelvis were performed. Transabdominal technique was performed for global imaging of the pelvis including uterus, ovaries, adnexal regions, and pelvic cul-de-sac.  It was necessary to proceed with endovaginal exam following the transabdominal exam to visualize the endometrium and ovaries.  Comparison:  11/22/2009  Findings:  Uterus: The uterus is anteverted and measures 8 x 3.6 x 4.8 cm.  No focal myometrial mass lesions.  The cervix is unremarkable.  Endometrium: Normal endometrial stripe thickness measured at 0.9 mm.  No abnormal endometrial fluid collections.  Right ovary:  The right ovary measures 4 x 1.9 x 1.7 cm.  Normal follicular changes.  Flow is demonstrated in the right ovary on color flow Doppler imaging.  No abnormal adnexal masses.  Left ovary: Left ovary measures 3.7 x 1.8 x 2.5 cm.  Normal follicular changes.  Flow is demonstrated in the left ovary on color flow Doppler imaging.  No abnormal adnexal masses.  Other findings: No free pelvic fluid collections are identified.  IMPRESSION: Normal study. No evidence of pelvic mass or other significant abnormality.   Original Report Authenticated By: Burman Nieves, M.D.    US Pelvis Complete  06/15/2012  *RADIOLOGY REPORT*  Clinical Data: Right lower quadrant pain for 1 month.  TRANSABDOMINAL AND TRANSVAGINAL ULTRASOUND OF PELVIS Technique:  Both transabdominal and transvaginal ultrasound examinations of the pelvis were performed. Transabdominal technique was performed for global imaging of the pelvis including uterus, ovaries, adnexal regions,  and pelvic cul-de-sac.  It was necessary to proceed with endovaginal exam following the transabdominal exam to visualize the endometrium and ovaries.  Comparison:  11/22/2009  Findings:  Uterus: The uterus is anteverted and measures 8 x 3.6 x 4.8 cm.  No focal myometrial mass lesions.  The cervix is unremarkable.  Endometrium: Normal endometrial stripe thickness measured at 0.9 mm.  No abnormal endometrial fluid collections.  Right ovary:  The right ovary measures 4 x 1.9 x 1.7 cm.  Normal follicular changes.  Flow is demonstrated in the right ovary on color flow Doppler imaging.  No abnormal adnexal masses.  Left ovary: Left ovary measures 3.7 x 1.8 x 2.5 cm.  Normal follicular changes.  Flow is demonstrated in the left ovary on color flow Doppler imaging.  No abnormal adnexal masses.  Other findings: No free pelvic fluid collections are identified.  IMPRESSION: Normal study. No evidence of pelvic mass or other significant abnormality.   Original Report Authenticated By: Burman Nieves, M.D.      1. RLQ abdominal pain       MDM  8:45 PM Basic labs pending. Urinalysis and urine preg pending. Patient will have morphine for pain and pelvic exam.   1:20 AM Labs, wet prep, urine pregnancy, and pelvic ultrasound unremarkable. Patient informed of results and will have an OBGYN and GI follow up that was scheduled prior to coming to the ED. Patient will have Percocet and Zofran to go home with. Patient advised to return to the ED with worsening or concerning symptoms. No further evaluation needed at this time.       Emilia Beck, PA-C 06/16/12 0127

## 2012-06-15 NOTE — ED Notes (Signed)
Pt states she has pelvic pain and is scheduled for a pelvic ultrasound next Tuesday,  Pt states she has lost 22 lbs in less than a month.  Pt has nausea too.

## 2012-06-15 NOTE — ED Notes (Signed)
Pt states that she went to her primary Friday, they completed a pap smear for possible ovarian cancer. Pt states she came here tonight for pain medication.

## 2012-06-16 LAB — POCT PREGNANCY, URINE: Preg Test, Ur: NEGATIVE

## 2012-06-16 MED ORDER — ONDANSETRON 4 MG PO TBDP: 4.0000 mg | ORAL_TABLET | Freq: Three times a day (TID) | ORAL | Status: DC | PRN

## 2012-06-16 MED ORDER — OXYCODONE-ACETAMINOPHEN 5-325 MG PO TABS: 2.0000 | ORAL_TABLET | ORAL | Status: DC | PRN

## 2012-06-16 MED ORDER — MORPHINE SULFATE 4 MG/ML IJ SOLN
2.0000 mg | Freq: Once | INTRAMUSCULAR | Status: AC
  Administered 2012-06-16: 2 mg via INTRAVENOUS
  Filled 2012-06-16: qty 1

## 2012-06-16 NOTE — ED Notes (Signed)
Patient is alert and oriented x3.  She was given DC instructions and follow up visit instructions.  Patient gave verbal understanding. She was DC ambulatory under her own power to home.  V/S stable.  He was not showing any signs of distress on DC 

## 2012-06-16 NOTE — ED Provider Notes (Signed)
Medical screening examination/treatment/procedure(s) were performed by non-physician practitioner and as supervising physician I was immediately available for consultation/collaboration.  Lajoya Dombek, MD 06/16/12 1504 

## 2012-06-17 NOTE — Assessment & Plan Note (Signed)
Unclear etiology.  Will begin evaluation with TSH, CBC, CMET and see if there are any obvious abnormalities.  Obviously, largest concern would be malignancy but this seems unlikely in a previously healthy 21 year old.  Other than red cells in urine, which is of unclear etiology, no obvious abnormalities on initial POCT labs.

## 2012-06-17 NOTE — Assessment & Plan Note (Signed)
With history of ovarian cancer at young age coupled with right adnexal tenderness will obtain pelvic US.  Likelihood is low but this could explain symptoms.  This will also rule out any significant pelvic pathology that is causing discomfort.

## 2012-06-18 ENCOUNTER — Telehealth: Payer: Self-pay | Admitting: Family Medicine

## 2012-06-18 ENCOUNTER — Ambulatory Visit (HOSPITAL_COMMUNITY): Admission: RE | Admit: 2012-06-18 | Payer: Self-pay | Source: Ambulatory Visit

## 2012-06-18 DIAGNOSIS — R319 Hematuria, unspecified: Secondary | ICD-10-CM

## 2012-06-18 NOTE — Telephone Encounter (Signed)
Patient is calling for results from testing last Friday.

## 2012-06-19 DIAGNOSIS — R319 Hematuria, unspecified: Secondary | ICD-10-CM | POA: Insufficient documentation

## 2012-06-19 NOTE — Telephone Encounter (Signed)
Tried to return call.  No answer.  Patient's lab results are normal except for blood in her urine.  She will need to repeat this and, if it is still present, we will need to do some more imaging.  I will try to contact her again but if she calls back, staff are welcome to tell her this.  I have ordered a UA and culture as a future order to be done next week.

## 2012-06-24 ENCOUNTER — Encounter (HOSPITAL_COMMUNITY): Payer: Self-pay

## 2012-06-24 ENCOUNTER — Ambulatory Visit: Payer: Self-pay

## 2012-06-24 ENCOUNTER — Emergency Department (INDEPENDENT_AMBULATORY_CARE_PROVIDER_SITE_OTHER)
Admission: EM | Admit: 2012-06-24 | Discharge: 2012-06-24 | Disposition: A | Discharge status: Home / Self Care | Payer: BC Managed Care – PPO | Source: Home / Self Care

## 2012-06-24 ENCOUNTER — Other Ambulatory Visit (HOSPITAL_COMMUNITY)
Admission: RE | Admit: 2012-06-24 | Discharge: 2012-06-24 | Disposition: A | Discharge status: Home / Self Care | Payer: BC Managed Care – PPO | Source: Ambulatory Visit | Attending: Family Medicine | Admitting: Family Medicine

## 2012-06-24 DIAGNOSIS — N946 Dysmenorrhea, unspecified: Secondary | ICD-10-CM

## 2012-06-24 DIAGNOSIS — N760 Acute vaginitis: Secondary | ICD-10-CM | POA: Insufficient documentation

## 2012-06-24 DIAGNOSIS — K5900 Constipation, unspecified: Secondary | ICD-10-CM

## 2012-06-24 MED ORDER — NAPROXEN SODIUM 275 MG PO TABS: 275.0000 mg | ORAL_TABLET | Freq: Two times a day (BID) | ORAL | Status: DC

## 2012-06-24 NOTE — ED Notes (Signed)
Patient complains of abd/pelvic pain for approx one month, also states she has a milky white d/c for approx 2 days

## 2012-06-24 NOTE — ED Provider Notes (Signed)
CC:  Abdominal pain, vaginal discharge  HPI:  21 y.o. female presents with intermittent abdominal pain, amenorrhea and new vaginal discharge. Pain present for 1 month - crampy and/or sharp, midline/bilateral pelvic to periumbilical, sometimes radiating to low back. May last an hour or two at a time. Tylenol and ibuprofen do not help. Lifting heavy things or certain foods seem to make the pain worse. No dysuria, urgency or frequency. No vaginal itching or discomfort. Vaginal discharge is white, thick, like curdled milk. Pt does have hard stools every other day, no blood. Occasional nausea/vomiting.   Has been seen by PCP this past month for abdominal pain - negative GC/chlamydia, wet prep, urine, CBC, BMP and normal pelvic ultrasound. LMP in January of 2013. Has been on Depo Provera shots off and on for past 3 or 4 years. Last shot was in April. Stopped due to migraines. Last sexual intercourse in September. Has not had any vaginal bleeding. Mom had ovarian cancer at her age so some concern for cancer. Does endorse decreased appetite and weight loss of 20 lbs over last 4-6 weeks.  Past Medical History  Diagnosis Date  . Chronic abdominal pain    Past Surgical History  Procedure Date  . Breast fibroadenoma surgery     Allergies  Allergen Reactions  . Milk-Related Compounds   . Peanut-Containing Drug Products   . Septra (Sulfamethoxazole-Tmp Ds) Hives   MEDICATIONS:  Tylenol, ibuprofen occasionally  SOC HX:  Smokes 2 cig a day Alcohol:  None Drugs:  None  ROS:  Negative except as stated in HPI  PHYS EXAM  Filed Vitals:   06/24/12 1800  BP: 116/68  Pulse: 73  Temp: 98.4 F (36.9 C)  Resp: 16    Physical Examination: General appearance - alert, well appearing, and in no distress and normal appearing weight Mental status - alert, oriented to person, place, and time, normal mood, behavior, speech, dress, motor activity, and thought processes Eyes - pupils equal and reactive,  extraocular eye movements intact Neck - supple, no significant adenopathy Chest - clear to auscultation, no wheezes, rales or rhonchi, symmetric air entry Heart - normal rate, regular rhythm, normal S1, S2, no murmurs, rubs, clicks or gallops Abdomen - tenderness noted pelvic/suprapubic - mild bowel sounds normal Pelvic - VULVA: normal appearing vulva with no masses, tenderness or lesions, VAGINA: normal appearing vagina with normal color, no lesions, vaginal discharge - white, odorless and thick, CERVIX: normal appearing cervix without discharge or lesions, no CMT; UTERUS: uterus is normal size, shape, consistency, mild tenderness, ADNEXA: normal adnexa in size, mild bilateral tenderness, no masses, WET MOUNT done - results: pending Neurological - alert, oriented, normal speech, no focal findings or movement disorder noted Extremities - peripheral pulses normal, no pedal edema, no clubbing or cyanosis Skin - warm and dry   A/P 21 y.o. female with chronic abdominal/pelvic pain. Recent work-up done by PCP negative. - Pain:  Dysmenorrhea vs irritable bowel/constipation vs endometriosis - Discharge:  Recheck wet prep due to new discharge, suspect physiological. - Constipation - hard stools - miralax, fiber, metamucil. Information given on IBS. - Amenorrhea - likely still having hormonal changes related to stopping depo provera - weight loss and appetite changes somewhat concerning - follow up with PCP.   Napoleon Form, MD 06/24/12 2138

## 2012-06-26 ENCOUNTER — Ambulatory Visit: Payer: Self-pay | Admitting: Family Medicine

## 2012-06-27 ENCOUNTER — Ambulatory Visit: Payer: Self-pay | Admitting: Family Medicine

## 2012-07-04 ENCOUNTER — Telehealth: Payer: Self-pay | Admitting: Family Medicine

## 2012-07-04 NOTE — Telephone Encounter (Signed)
Is asking for results of her labs and [place]

## 2012-07-08 NOTE — Telephone Encounter (Signed)
Not able to contact.  Will send letter.

## 2012-07-09 ENCOUNTER — Encounter: Payer: Self-pay | Admitting: Internal Medicine

## 2012-07-10 ENCOUNTER — Ambulatory Visit: Payer: Self-pay | Admitting: Internal Medicine

## 2012-07-31 ENCOUNTER — Telehealth: Payer: Self-pay | Admitting: Family Medicine

## 2012-07-31 NOTE — Telephone Encounter (Signed)
Mother calls after hour line because she checked her daughter's sugar and it was 273. Patient has had some weight loss as well recently.  Patient is not a known diabetic, however, her PCP Dr. Louanne Belton has been aware of her weight loss and has been working this up.  I advised patient call tomorrow and make a SDA.

## 2012-08-02 ENCOUNTER — Ambulatory Visit (INDEPENDENT_AMBULATORY_CARE_PROVIDER_SITE_OTHER): Payer: BC Managed Care – PPO | Admitting: Family Medicine

## 2012-08-02 ENCOUNTER — Encounter: Payer: Self-pay | Admitting: Family Medicine

## 2012-08-02 VITALS — BP 105/69 | HR 71 | Temp 98.2°F | Ht 64.0 in | Wt 109.6 lb

## 2012-08-02 DIAGNOSIS — R7309 Other abnormal glucose: Secondary | ICD-10-CM

## 2012-08-02 DIAGNOSIS — N644 Mastodynia: Secondary | ICD-10-CM

## 2012-08-02 DIAGNOSIS — R634 Abnormal weight loss: Secondary | ICD-10-CM

## 2012-08-02 LAB — POCT GLYCOSYLATED HEMOGLOBIN (HGB A1C): Hemoglobin A1C: 5.5

## 2012-08-02 MED ORDER — IBUPROFEN 800 MG PO TABS: 800.0000 mg | ORAL_TABLET | Freq: Three times a day (TID) | ORAL | Status: DC

## 2012-08-02 NOTE — Patient Instructions (Signed)
I want you to try wearing a sports bra every day for the next 2 months. Please keep a record of how bad your pain is every day using a scale of 0-10. I am giving you high dose ibuprofen to take three times per day for the next two weeks. Come back to see me in 2 months.

## 2012-08-05 ENCOUNTER — Encounter: Payer: Self-pay | Admitting: Family Medicine

## 2012-08-05 DIAGNOSIS — N644 Mastodynia: Secondary | ICD-10-CM | POA: Insufficient documentation

## 2012-08-05 NOTE — Assessment & Plan Note (Addendum)
Uncertain etiology, however given the lack of any masses, skin changes, or discharge it is unlikely to be a serious etiology. I will have the patient keep a diary of her pain level, wear supportive brassieres, and try 2 weeks of anti-inflammatories. Given the patient's age mammography is unlikely to be helpful, and given the lack of any discrete mass I also feel that ultrasound would be unlikely to be helpful. We may pursue these further of the patient's discomfort persists beyond the next several months.

## 2012-08-05 NOTE — Assessment & Plan Note (Signed)
Patient's A1c today is normal. This is despite report by her mother of an elevated blood sugar. Her weight is also the same as last time. We will continue to monitor.

## 2012-08-05 NOTE — Progress Notes (Signed)
Patient ID: Kathryn Fox, female   DOB: 04-Sep-1990, 22 y.o.   MRN: [id number] Subjective: The patient is a 22 y.o. year old female who presents today for 2 concerns.  1. Hyperglycemia: As noted in a recent phone note, the patient's mother check her blood sugar and found to be elevated. The patient wonders if her weight loss over the last year could be related to diabetes. We will check an A1c today.  2. Breast pain: Patient has been having problems with breast pain for the last several months. The pain is bilateral and generalized. She is not able to describe the character of the pain. She has not noticed any cyclical nature. She denies any skin changes, nipple discharge, or masses. 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 She feels that her left breast may be swelling in a similar manner to when this was found.  Patient's past medical, social, and family history were reviewed and updated as appropriate. History  Substance Use Topics  . Smoking status: Light Tobacco Smoker -- 0.2 packs/day    Types: Cigarettes  . Smokeless tobacco: Not on file  . Alcohol Use: No   Objective:  Filed Vitals:   08/02/12 1054  BP: 105/69  Pulse: 71  Temp: 98.2 F (36.8 C)   Gen: NAD, thin Chest: Breasts are symmetric and ptotic bilaterally. There is no skin retraction, erythema, or excessive dryness. There is no nipple discharge. There are fibrocystic changes bilaterally but no dominant masses. There is mild discomfort on deep palpation of the breasts bilaterally. There is no expressible nipple drainage.  Assessment/Plan:  Please also see individual problems in problem list for problem-specific plans.

## 2012-09-07 ENCOUNTER — Other Ambulatory Visit: Payer: Self-pay

## 2012-09-10 ENCOUNTER — Encounter (HOSPITAL_COMMUNITY): Payer: Self-pay | Admitting: Emergency Medicine

## 2012-09-10 ENCOUNTER — Emergency Department (HOSPITAL_COMMUNITY)
Admission: EM | Admit: 2012-09-10 | Discharge: 2012-09-10 | Disposition: A | Discharge status: Home / Self Care | Payer: BC Managed Care – PPO | Attending: Emergency Medicine | Admitting: Emergency Medicine

## 2012-09-10 DIAGNOSIS — R1033 Periumbilical pain: Secondary | ICD-10-CM | POA: Insufficient documentation

## 2012-09-10 DIAGNOSIS — Z3202 Encounter for pregnancy test, result negative: Secondary | ICD-10-CM | POA: Insufficient documentation

## 2012-09-10 DIAGNOSIS — R112 Nausea with vomiting, unspecified: Secondary | ICD-10-CM

## 2012-09-10 DIAGNOSIS — G8929 Other chronic pain: Secondary | ICD-10-CM | POA: Insufficient documentation

## 2012-09-10 DIAGNOSIS — F17200 Nicotine dependence, unspecified, uncomplicated: Secondary | ICD-10-CM | POA: Insufficient documentation

## 2012-09-10 DIAGNOSIS — R109 Unspecified abdominal pain: Secondary | ICD-10-CM

## 2012-09-10 LAB — WET PREP, GENITAL
Clue Cells Wet Prep HPF POC: NONE SEEN
Trich, Wet Prep: NONE SEEN
Yeast Wet Prep HPF POC: NONE SEEN

## 2012-09-10 LAB — URINALYSIS, ROUTINE W REFLEX MICROSCOPIC
Bilirubin Urine: NEGATIVE
Ketones, ur: 40 mg/dL — AB
Nitrite: NEGATIVE
Protein, ur: NEGATIVE mg/dL
Urobilinogen, UA: 1 mg/dL (ref 0.0–1.0)

## 2012-09-10 LAB — GC/CHLAMYDIA PROBE AMP: GC Probe RNA: NEGATIVE

## 2012-09-10 LAB — URINE MICROSCOPIC-ADD ON

## 2012-09-10 LAB — CBC WITH DIFFERENTIAL/PLATELET
Basophils Relative: 0 % (ref 0–1)
Hemoglobin: 12.2 g/dL (ref 12.0–15.0)
Lymphs Abs: 0.5 10*3/uL — ABNORMAL LOW (ref 0.7–4.0)
MCHC: 33.5 g/dL (ref 30.0–36.0)
Monocytes Relative: 5 % (ref 3–12)
Neutro Abs: 9.4 10*3/uL — ABNORMAL HIGH (ref 1.7–7.7)
Neutrophils Relative %: 90 % — ABNORMAL HIGH (ref 43–77)
Platelets: 243 10*3/uL (ref 150–400)
RBC: 4.32 MIL/uL (ref 3.87–5.11)

## 2012-09-10 LAB — COMPREHENSIVE METABOLIC PANEL
Albumin: 3.7 g/dL (ref 3.5–5.2)
BUN: 10 mg/dL (ref 6–23)
Creatinine, Ser: 0.65 mg/dL (ref 0.50–1.10)
Total Protein: 6.6 g/dL (ref 6.0–8.3)

## 2012-09-10 LAB — PREGNANCY, URINE: Preg Test, Ur: NEGATIVE

## 2012-09-10 LAB — LIPASE, BLOOD: Lipase: 21 U/L (ref 11–59)

## 2012-09-10 MED ORDER — ONDANSETRON 4 MG PO TBDP: 4.0000 mg | ORAL_TABLET | Freq: Three times a day (TID) | ORAL | Status: DC | PRN

## 2012-09-10 MED ORDER — SODIUM CHLORIDE 0.9 % IV BOLUS (SEPSIS)
1000.0000 mL | Freq: Once | INTRAVENOUS | Status: AC
  Administered 2012-09-10: 1000 mL via INTRAVENOUS

## 2012-09-10 MED ORDER — MORPHINE SULFATE 2 MG/ML IJ SOLN
1.0000 mg | Freq: Once | INTRAMUSCULAR | Status: AC
  Administered 2012-09-10: 1 mg via INTRAVENOUS
  Filled 2012-09-10: qty 1

## 2012-09-10 MED ORDER — POTASSIUM CHLORIDE 10 MEQ/100ML IV SOLN
10.0000 meq | INTRAVENOUS | Status: DC
  Administered 2012-09-10: 10 meq via INTRAVENOUS
  Filled 2012-09-10 (×2): qty 100

## 2012-09-10 MED ORDER — ONDANSETRON 4 MG PO TBDP
4.0000 mg | ORAL_TABLET | Freq: Once | ORAL | Status: AC
  Administered 2012-09-10: 4 mg via ORAL
  Filled 2012-09-10: qty 1

## 2012-09-10 NOTE — ED Provider Notes (Signed)
I have personally seen and examined the patient.  I have discussed the plan of care with the resident.  I have reviewed the documentation on PMH/FH/Soc. History.  I have reviewed the documentation of the resident and agree.  Pt well appearing, ambulatory, abdominal exam benign on my re-evaluation, do not feel imaging warranted  Joya Gaskins, MD 09/10/12 1140

## 2012-09-10 NOTE — ED Notes (Signed)
Given oral trial w/ ginger ale

## 2012-09-10 NOTE — ED Notes (Signed)
States that the n/v/d has gone thru her family

## 2012-09-10 NOTE — ED Provider Notes (Signed)
History     CSN: [id number]  Arrival date & time 09/10/12  0715   First MD Initiated Contact with Patient 09/10/12 [phone]      No chief complaint on file.   (Consider location/radiation/quality/duration/timing/severity/associated sxs/prior treatment) HPI Comments: 22 y.o PMH benign right breast mass, breast pain.  She presents for n/v x 2-3 weeks, 6/10 stabbing abdominal pain (below umbilicus) since 9PM night prior, subjective fever, denies chills, +diarrhea, +breast tenderness around areola/nipple, wt loss 22 lbs (since 05/2012).  She last ate a pizza out the the machine at work. She tried to drink juice but was unable to tolerate.  She is sexually active with intermittent use of condoms.   Sick contact with other family with GI symptoms. LMP 05/2012 she was getting depo shots until 10/2011 but no longer. Last sexual encounter last week.   The history is provided by the patient and a parent. No language interpreter was used.    Past Medical History  Diagnosis Date  . Chronic abdominal pain     Past Surgical History  Procedure Laterality Date  . Breast fibroadenoma surgery      Family History  Problem Relation Age of Onset  . Hypertension Mother   . Hyperlipidemia Mother   . Diabetes Mother   . Cancer Maternal Aunt     Breast  . Diabetes Maternal Aunt   . Stroke Maternal Grandmother   . Cancer Paternal Grandfather     History  Substance Use Topics  . Smoking status: Light Tobacco Smoker -- 0.20 packs/day    Types: Cigarettes  . Smokeless tobacco: Not on file  . Alcohol Use: No    OB History   Grav Para Term Preterm Abortions TAB SAB Ect Mult Living                  Review of Systems  All other systems reviewed and are negative.    Allergies  Milk-related compounds; Peanut-containing drug products; and Septra  Home Medications   Current Outpatient Rx  Name  Route  Sig  Dispense  Refill  . ondansetron (ZOFRAN-ODT) 4 MG disintegrating tablet   Oral   Take  1 tablet (4 mg total) by mouth every 8 (eight) hours as needed for nausea.   20 tablet   0     BP 107/58  Pulse 98  Temp(Src) 100.3 F (37.9 C) (Oral)  Resp 18  SpO2 100%  Physical Exam  Nursing note and vitals reviewed. Constitutional: She is oriented to person, place, and time. She appears well-developed and well-nourished. She is cooperative. She has a sickly appearance. No distress.  HENT:  Head: Normocephalic and atraumatic.  Mouth/Throat: Oropharynx is clear and moist and mucous membranes are normal.  Eyes: Conjunctivae are normal. Pupils are equal, round, and reactive to light. Right eye exhibits no discharge. Left eye exhibits no discharge. No scleral icterus.  Cardiovascular: Normal rate, regular rhythm, S1 normal, S2 normal and normal heart sounds.   No murmur heard. Pulmonary/Chest: Effort normal and breath sounds normal. No respiratory distress. She has no wheezes.  Abdominal: Soft. She exhibits distension. There is tenderness in the periumbilical area. There is CVA tenderness.    Generalized ttp worse below umbilicus  B/l CVA ttp  Genitourinary: Pelvic exam was performed with patient supine. There is no rash or lesion on the right labia. There is no rash or lesion on the left labia. Cervix exhibits discharge.  Thick white discharge  Pain in SPR with palpation of cervix  Musculoskeletal: She exhibits no edema.  Neurological: She is alert and oriented to person, place, and time.  Skin: Skin is warm, dry and intact. No rash noted. She is not diaphoretic.  Psychiatric: She has a normal mood and affect. Her speech is normal and behavior is normal. Judgment and thought content normal. Cognition and memory are normal.    ED Course  Pelvic exam Date/Time: 09/10/2012 8:14 AM Performed by: Annett Gula Authorized by: Joya Gaskins Consent: Verbal consent obtained. Consent given by: patient Patient understanding: patient states understanding of the procedure  being performed   (including critical care time)  Labs Reviewed  WET PREP, GENITAL - Abnormal; Notable for the following:    WBC, Wet Prep HPF POC FEW (*)    All other components within normal limits  URINALYSIS, ROUTINE W REFLEX MICROSCOPIC - Abnormal; Notable for the following:    Hgb urine dipstick MODERATE (*)    Ketones, ur 40 (*)    All other components within normal limits  COMPREHENSIVE METABOLIC PANEL - Abnormal; Notable for the following:    Potassium 3.3 (*)    Glucose, Bld 105 (*)    All other components within normal limits  CBC WITH DIFFERENTIAL - Abnormal; Notable for the following:    Neutrophils Relative 90 (*)    Neutro Abs 9.4 (*)    Lymphocytes Relative 5 (*)    Lymphs Abs 0.5 (*)    All other components within normal limits  GC/CHLAMYDIA PROBE AMP  LIPASE, BLOOD  PREGNANCY, URINE  URINE MICROSCOPIC-ADD ON   No results found.   1. Abdominal pain, chronic   2. Nausea vomiting and diarrhea       MDM  Likely chronic abdominal pain with suspected gastroenteritis  CMET, lipase, STD screen, UA, Urine preg, CBC ODT zofran, morphine 1 mg x 1. Will d/c with ODT zofran  Oral trial  F/u PCP [facility] outpatient clinic [place] [address] [phone]   [facility] MD [phone]    Annett Gula, MD 09/10/12 1052  Annett Gula, MD 09/10/12 1104  Annett Gula, MD 09/10/12 [phone]

## 2012-09-10 NOTE — ED Notes (Signed)
N/v/d since last night IV per ems and zofran

## 2012-09-10 NOTE — ED Notes (Signed)
Feels better she states °

## 2012-09-10 NOTE — ED Notes (Signed)
States last depo was in April and she started to have pelvic pain in nov also having vag d/c but denies dysuria pt has unprotected intercourse

## 2012-12-28 ENCOUNTER — Other Ambulatory Visit (HOSPITAL_COMMUNITY)
Admission: RE | Admit: 2012-12-28 | Discharge: 2012-12-28 | Disposition: A | Discharge status: Home / Self Care | Payer: Self-pay | Source: Ambulatory Visit | Attending: Emergency Medicine | Admitting: Emergency Medicine

## 2012-12-28 ENCOUNTER — Encounter (HOSPITAL_COMMUNITY): Payer: Self-pay | Admitting: Emergency Medicine

## 2012-12-28 ENCOUNTER — Emergency Department (INDEPENDENT_AMBULATORY_CARE_PROVIDER_SITE_OTHER)
Admission: EM | Admit: 2012-12-28 | Discharge: 2012-12-28 | Disposition: A | Discharge status: Home / Self Care | Payer: Self-pay | Source: Home / Self Care | Attending: Emergency Medicine | Admitting: Emergency Medicine

## 2012-12-28 DIAGNOSIS — B9689 Other specified bacterial agents as the cause of diseases classified elsewhere: Secondary | ICD-10-CM

## 2012-12-28 DIAGNOSIS — Z113 Encounter for screening for infections with a predominantly sexual mode of transmission: Secondary | ICD-10-CM | POA: Insufficient documentation

## 2012-12-28 DIAGNOSIS — N760 Acute vaginitis: Secondary | ICD-10-CM | POA: Insufficient documentation

## 2012-12-28 DIAGNOSIS — A499 Bacterial infection, unspecified: Secondary | ICD-10-CM

## 2012-12-28 MED ORDER — METRONIDAZOLE 500 MG PO TABS: 500.0000 mg | ORAL_TABLET | Freq: Two times a day (BID) | ORAL | Status: DC

## 2012-12-28 NOTE — ED Provider Notes (Signed)
History     CSN: [id number]  Arrival date & time 12/28/12  1316   First MD Initiated Contact with Patient 12/28/12 1443      Chief Complaint  Patient presents with  . Vaginal Discharge    thick white vaginal discharge with irritaition and burning sensation. request std check also.     (Consider location/radiation/quality/duration/timing/severity/associated sxs/prior treatment) HPI Comments: Pt presents c/o vaginal irritation, severe itching for 2 days.  She has also noticed some vaginal discharge with a foul odor.  She states she thinks it is probably from a condom she used that may have been too abrasive.  She does admit to some abdominal pain but says this is normal for her to have in the middle of her cycle which she is in now.  Denies any fever, chills, NVD, vaginal bleeding, dyspareunia.    Patient is a 22 y.o. female presenting with vaginal discharge.  Vaginal Discharge Associated symptoms: abdominal pain   Associated symptoms: no dysuria, no fever, no nausea and no vomiting     Past Medical History  Diagnosis Date  . Chronic abdominal pain     Past Surgical History  Procedure Laterality Date  . Breast fibroadenoma surgery      Family History  Problem Relation Age of Onset  . Hypertension Mother   . Hyperlipidemia Mother   . Diabetes Mother   . Cancer Maternal Aunt     Breast  . Diabetes Maternal Aunt   . Stroke Maternal Grandmother   . Cancer Paternal Grandfather     History  Substance Use Topics  . Smoking status: Light Tobacco Smoker -- 0.20 packs/day    Types: Cigarettes  . Smokeless tobacco: Not on file  . Alcohol Use: No    OB History   Grav Para Term Preterm Abortions TAB SAB Ect Mult Living                  Review of Systems  Constitutional: Negative for fever and chills.  Eyes: Negative for visual disturbance.  Respiratory: Negative for cough and shortness of breath.   Cardiovascular: Negative for chest pain, palpitations and leg swelling.   Gastrointestinal: Positive for abdominal pain. Negative for nausea and vomiting.  Endocrine: Negative for polydipsia and polyuria.  Genitourinary: Positive for vaginal discharge and vaginal pain (severe itching). Negative for dysuria, urgency and frequency.  Musculoskeletal: Negative for myalgias and arthralgias.  Skin: Negative for rash.  Neurological: Negative for dizziness, weakness and light-headedness.    Allergies  Milk-related compounds; Peanut-containing drug products; and Septra  Home Medications   Current Outpatient Rx  Name  Route  Sig  Dispense  Refill  . metroNIDAZOLE (FLAGYL) 500 MG tablet   Oral   Take 1 tablet (500 mg total) by mouth 2 (two) times daily.   14 tablet   0   . ondansetron (ZOFRAN-ODT) 4 MG disintegrating tablet   Oral   Take 1 tablet (4 mg total) by mouth every 8 (eight) hours as needed for nausea.   20 tablet   0     BP 119/65  Pulse 87  Temp(Src) 98.7 F (37.1 C) (Oral)  Resp 18  SpO2 100%  LMP 12/05/2012  Physical Exam  Nursing note and vitals reviewed. Constitutional: She is oriented to person, place, and time. Vital signs are normal. She appears well-developed and well-nourished. No distress.  HENT:  Head: Atraumatic.  Eyes: EOM are normal. Pupils are equal, round, and reactive to light.  Cardiovascular: Normal rate, regular  rhythm and normal heart sounds.  Exam reveals no gallop and no friction rub.   No murmur heard. Pulmonary/Chest: Effort normal and breath sounds normal. No respiratory distress. She has no wheezes. She has no rales.  Abdominal: Soft. Normal appearance. There is no hepatosplenomegaly. There is tenderness (mildly TTP to the left of the suprapubic area ]) in the suprapubic area. There is no rigidity, no rebound, no guarding, no CVA tenderness, no tenderness at McBurney's point and negative Murphy's sign. No hernia.  Genitourinary: Cervix exhibits no motion tenderness and no friability. Right adnexum displays  tenderness (moderate). Vaginal discharge (thin white) found.  Neurological: She is alert and oriented to person, place, and time. She has normal strength.  Skin: Skin is warm and dry. She is not diaphoretic.  Psychiatric: She has a normal mood and affect. Her behavior is normal. Judgment normal.    ED Course  Procedures (including critical care time)  Labs Reviewed  RPR  HSV 1 ANTIBODY, IGG  HSV 2 ANTIBODY, IGG  HIV ANTIBODY (ROUTINE TESTING)  CERVICOVAGINAL ANCILLARY ONLY   No results found.   1. BV (bacterial vaginosis)       MDM  Will treat empirically with flagyl.  F/u if condition worsens.  Will call if swab yields different result   Meds ordered this encounter  Medications  . metroNIDAZOLE (FLAGYL) 500 MG tablet    Sig: Take 1 tablet (500 mg total) by mouth 2 (two) times daily.    Dispense:  14 tablet    Refill:  0           Graylon Good, PA-C 12/28/12 1518

## 2012-12-28 NOTE — ED Provider Notes (Signed)
Medical screening examination/treatment/procedure(s) were performed by non-physician practitioner and as supervising physician I was immediately available for consultation/collaboration.  Leslee Home, M.D.  Reuben Likes, MD 12/28/12 [phone]

## 2012-12-28 NOTE — ED Notes (Signed)
Pt c/o vaginal irritation with burning sensation. Thick white vaginal discharge since June 5th.   Pt also request std check. Denies any pelvic or abdominal pain.

## 2012-12-30 ENCOUNTER — Telehealth (HOSPITAL_COMMUNITY): Payer: Self-pay | Admitting: *Deleted

## 2012-12-30 NOTE — ED Notes (Signed)
HIV/RPR non-reactive, HSV 1 0.18 neg., HSV 2 0.10 neg., Affirm: Candida and Gardnerella pos., Trich neg.  Pt. adequately treated with Flagyl. Discussed with Almedia Balls PA and he said he did order GC/Chlamydia, so it is pending.  He said tell pt. to use Monistat 1-2 days past the Flagyl.  I called pt., but no answer and then got a busy signal. Vassie Moselle 12/30/2012

## 2012-12-31 ENCOUNTER — Telehealth (HOSPITAL_COMMUNITY): Payer: Self-pay | Admitting: *Deleted

## 2012-12-31 NOTE — ED Notes (Signed)
GC/Chlamydia neg.  I called pt. and left a message to call. Call 2. Vassie Moselle 12/31/2012

## 2013-01-02 NOTE — ED Notes (Signed)
Call from patient to review lab reports. Patient ID verified. Rx to [facility] pharmacy for c/o vaginal itching. Zack Baker authiruzed diflucan 150 mg , 2 tabs; take 1 now and 1 in 1 week if still having syx

## 2013-02-21 ENCOUNTER — Emergency Department (HOSPITAL_COMMUNITY)
Admission: EM | Admit: 2013-02-21 | Discharge: 2013-02-21 | Disposition: A | Discharge status: Home / Self Care | Payer: Self-pay | Attending: Emergency Medicine | Admitting: Emergency Medicine

## 2013-02-21 ENCOUNTER — Encounter (HOSPITAL_COMMUNITY): Payer: Self-pay | Admitting: *Deleted

## 2013-02-21 DIAGNOSIS — F17200 Nicotine dependence, unspecified, uncomplicated: Secondary | ICD-10-CM | POA: Insufficient documentation

## 2013-02-21 DIAGNOSIS — Z3202 Encounter for pregnancy test, result negative: Secondary | ICD-10-CM | POA: Insufficient documentation

## 2013-02-21 DIAGNOSIS — N898 Other specified noninflammatory disorders of vagina: Secondary | ICD-10-CM | POA: Insufficient documentation

## 2013-02-21 DIAGNOSIS — N760 Acute vaginitis: Secondary | ICD-10-CM | POA: Insufficient documentation

## 2013-02-21 DIAGNOSIS — R1031 Right lower quadrant pain: Secondary | ICD-10-CM | POA: Insufficient documentation

## 2013-02-21 DIAGNOSIS — R350 Frequency of micturition: Secondary | ICD-10-CM | POA: Insufficient documentation

## 2013-02-21 DIAGNOSIS — R197 Diarrhea, unspecified: Secondary | ICD-10-CM | POA: Insufficient documentation

## 2013-02-21 DIAGNOSIS — N730 Acute parametritis and pelvic cellulitis: Secondary | ICD-10-CM | POA: Insufficient documentation

## 2013-02-21 DIAGNOSIS — B9689 Other specified bacterial agents as the cause of diseases classified elsewhere: Secondary | ICD-10-CM

## 2013-02-21 DIAGNOSIS — R112 Nausea with vomiting, unspecified: Secondary | ICD-10-CM | POA: Insufficient documentation

## 2013-02-21 LAB — COMPREHENSIVE METABOLIC PANEL
Alkaline Phosphatase: 57 U/L (ref 39–117)
BUN: 9 mg/dL (ref 6–23)
CO2: 26 mEq/L (ref 19–32)
Chloride: 106 mEq/L (ref 96–112)
GFR calc Af Amer: >90 mL/min (ref >90)
GFR calc non Af Amer: >90 mL/min (ref >90)
Glucose, Bld: 87 mg/dL (ref 70–99)
Potassium: 3.7 mEq/L (ref 3.5–5.1)
Total Bilirubin: 0.3 mg/dL (ref 0.3–1.2)
Total Protein: 6.2 g/dL (ref 6.0–8.3)

## 2013-02-21 LAB — POCT PREGNANCY, URINE: Preg Test, Ur: NEGATIVE

## 2013-02-21 LAB — URINALYSIS, ROUTINE W REFLEX MICROSCOPIC
Bilirubin Urine: NEGATIVE
Nitrite: NEGATIVE
Specific Gravity, Urine: 1.017 (ref 1.005–1.030)
pH: 7 (ref 5.0–8.0)

## 2013-02-21 LAB — WET PREP, GENITAL
Trich, Wet Prep: NONE SEEN
Yeast Wet Prep HPF POC: NONE SEEN

## 2013-02-21 LAB — CBC WITH DIFFERENTIAL/PLATELET
Eosinophils Absolute: 0.1 10*3/uL (ref 0.0–0.7)
Hemoglobin: 11.7 g/dL — ABNORMAL LOW (ref 12.0–15.0)
Lymphocytes Relative: 29 % (ref 12–46)
Lymphs Abs: 1.9 10*3/uL (ref 0.7–4.0)
MCH: 28.7 pg (ref 26.0–34.0)
Monocytes Relative: 6 % (ref 3–12)
Neutrophils Relative %: 63 % (ref 43–77)
RBC: 4.08 MIL/uL (ref 3.87–5.11)

## 2013-02-21 MED ORDER — LIDOCAINE HCL (PF) 1 % IJ SOLN
INTRAMUSCULAR | Status: AC
  Administered 2013-02-21: 1.8 mL
  Filled 2013-02-21: qty 5

## 2013-02-21 MED ORDER — DOXYCYCLINE HYCLATE 100 MG PO CAPS: 100.0000 mg | ORAL_CAPSULE | Freq: Two times a day (BID) | ORAL | Status: DC

## 2013-02-21 MED ORDER — CEFTRIAXONE SODIUM 1 G IJ SOLR
1.0000 g | Freq: Once | INTRAMUSCULAR | Status: AC
  Administered 2013-02-21: 1 g via INTRAMUSCULAR
  Filled 2013-02-21: qty 10

## 2013-02-21 MED ORDER — AZITHROMYCIN 250 MG PO TABS
1000.0000 mg | ORAL_TABLET | Freq: Once | ORAL | Status: AC
Start: 2013-02-21 — End: 2013-02-21
  Administered 2013-02-21: 1000 mg via ORAL
  Filled 2013-02-21: qty 4

## 2013-02-21 MED ORDER — KETOROLAC TROMETHAMINE 60 MG/2ML IM SOLN
60.0000 mg | Freq: Once | INTRAMUSCULAR | Status: AC
  Administered 2013-02-21: 60 mg via INTRAMUSCULAR
  Filled 2013-02-21: qty 2

## 2013-02-21 MED ORDER — METRONIDAZOLE 500 MG PO TABS: 500.0000 mg | ORAL_TABLET | Freq: Two times a day (BID) | ORAL | Status: DC

## 2013-02-21 NOTE — ED Provider Notes (Signed)
CSN: [id number]     Arrival date & time 02/21/13  4098 History     First MD Initiated Contact with Patient 02/21/13 [phone]     Chief Complaint  Patient presents with  . Emesis  . Diarrhea   (Consider location/radiation/quality/duration/timing/severity/associated sxs/prior Treatment) HPI Comments: 22 y/o female with a PMHx of chronic abdominal pain presents to the ED complaining of gradual onset RLQ abdominal pain x 1 week, worsening over the past 2 days. Pain intermittent, non-radiating, sharp, 7/10. She has not tried any alleviating factors. Admits to associated nausea and a few episodes of vomiting, currently not nauseated. Admits to increased urinary frequency without urgency or dysuria. LMP July 15 and was normal. Denies vaginal bleeding or discharge. Sexually active with one partner, states condoms for protection. Denies diarrhea, fever or chills. No hx of abdominal surgeries.  The history is provided by the patient.    Past Medical History  Diagnosis Date  . Chronic abdominal pain    Past Surgical History  Procedure Laterality Date  . Breast fibroadenoma surgery     Family History  Problem Relation Age of Onset  . Hypertension Mother   . Hyperlipidemia Mother   . Diabetes Mother   . Cancer Maternal Aunt     Breast  . Diabetes Maternal Aunt   . Stroke Maternal Grandmother   . Cancer Paternal Grandfather    History  Substance Use Topics  . Smoking status: Light Tobacco Smoker -- 0.20 packs/day    Types: Cigarettes  . Smokeless tobacco: Not on file  . Alcohol Use: No   OB History   Grav Para Term Preterm Abortions TAB SAB Ect Mult Living                 Review of Systems  Constitutional: Negative for fever and chills.  Gastrointestinal: Positive for nausea, vomiting and abdominal pain. Negative for diarrhea and constipation.  Genitourinary: Positive for frequency. Negative for dysuria, urgency, vaginal bleeding, vaginal discharge, menstrual problem, pelvic pain and  dyspareunia.  Musculoskeletal: Negative for back pain.  All other systems reviewed and are negative.    Allergies  Milk-related compounds; Peanut-containing drug products; and Septra  Home Medications  No current outpatient prescriptions on file. BP 119/74  Pulse 85  Temp(Src) 98 F (36.7 C) (Oral)  Resp 18  SpO2 95%  LMP 02/04/2013 Physical Exam  Nursing note and vitals reviewed. Constitutional: She is oriented to person, place, and time. She appears well-developed and well-nourished. No distress.  HENT:  Head: Normocephalic and atraumatic.  Mouth/Throat: Oropharynx is clear and moist.  Eyes: Conjunctivae are normal.  Neck: Normal range of motion. Neck supple.  Cardiovascular: Normal rate, regular rhythm and normal heart sounds.   Pulmonary/Chest: Effort normal and breath sounds normal.  Abdominal: Soft. Normal appearance and bowel sounds are normal. She exhibits no distension and no mass. There is tenderness in the right lower quadrant and suprapubic area. There is no rigidity, no rebound, no guarding and no CVA tenderness.  No peritoneal signs.  Genitourinary: Uterus normal. Cervix exhibits motion tenderness and discharge. Right adnexum displays no mass, no tenderness and no fullness. Left adnexum displays no mass, no tenderness and no fullness. No erythema around the vagina. Vaginal discharge (white, malodorous) found.  Musculoskeletal: Normal range of motion. She exhibits no edema.  Neurological: She is alert and oriented to person, place, and time.  Skin: Skin is warm and dry. She is not diaphoretic.  Psychiatric: She has a normal mood and affect. Her  behavior is normal.    ED Course   Procedures (including critical care time)  Labs Reviewed  WET PREP, GENITAL - Abnormal; Notable for the following:    Clue Cells Wet Prep HPF POC MANY (*)    WBC, Wet Prep HPF POC FEW (*)    All other components within normal limits  URINALYSIS, ROUTINE W REFLEX MICROSCOPIC -  Abnormal; Notable for the following:    APPearance CLOUDY (*)    Hgb urine dipstick LARGE (*)    All other components within normal limits  CBC WITH DIFFERENTIAL - Abnormal; Notable for the following:    Hemoglobin 11.7 (*)    HCT 35.1 (*)    All other components within normal limits  URINE MICROSCOPIC-ADD ON - Abnormal; Notable for the following:    Squamous Epithelial / LPF MANY (*)    All other components within normal limits  GC/CHLAMYDIA PROBE AMP  COMPREHENSIVE METABOLIC PANEL  POCT PREGNANCY, URINE   No results found. 1. PID (acute pelvic inflammatory disease)   2. BV (bacterial vaginosis)     MDM  Patient with RLQ abdominal pain, nausea.  Symptoms present x 1 week, not sudden onset, normal appetite, no fever. Unlikely appendicitis. Pelvic exam performed, CMT present, large amount of discharge. No adnexal tenderness. Will treat for PID. Rocephin and azithromycin given in ED, discharge with doxy. BV evident on wet prep. Rx for flagyl given. Infection care/precautions discussed. Return precautions discussed. Patient states understanding of plan and is agreeable.   Trevor Mace, PA-C 02/21/13 1126

## 2013-02-21 NOTE — ED Provider Notes (Signed)
Medical screening examination/treatment/procedure(s) were performed by non-physician practitioner and as supervising physician I was immediately available for consultation/collaboration.   Destanee Bedonie B. Bernette Mayers, MD 02/21/13 [phone]

## 2013-02-22 LAB — GC/CHLAMYDIA PROBE AMP
CT Probe RNA: NEGATIVE
GC Probe RNA: NEGATIVE

## 2013-03-31 ENCOUNTER — Ambulatory Visit: Payer: Self-pay

## 2013-04-01 ENCOUNTER — Ambulatory Visit: Payer: Self-pay

## 2013-05-07 ENCOUNTER — Ambulatory Visit: Payer: Self-pay

## 2013-05-13 ENCOUNTER — Ambulatory Visit: Payer: Self-pay

## 2013-05-29 ENCOUNTER — Ambulatory Visit (INDEPENDENT_AMBULATORY_CARE_PROVIDER_SITE_OTHER): Payer: No Typology Code available for payment source | Admitting: Family Medicine

## 2013-05-29 ENCOUNTER — Encounter: Payer: Self-pay | Admitting: Family Medicine

## 2013-05-29 ENCOUNTER — Other Ambulatory Visit: Payer: Self-pay

## 2013-05-29 VITALS — BP 117/76 | HR 76 | Temp 98.2°F | Wt 124.0 lb

## 2013-05-29 DIAGNOSIS — F329 Major depressive disorder, single episode, unspecified: Secondary | ICD-10-CM

## 2013-05-29 DIAGNOSIS — F32A Depression, unspecified: Secondary | ICD-10-CM | POA: Insufficient documentation

## 2013-05-29 MED ORDER — FLUOXETINE HCL 20 MG PO TABS: 20.0000 mg | ORAL_TABLET | Freq: Every morning | ORAL | Status: DC

## 2013-05-29 NOTE — Progress Notes (Signed)
Subjective:     Patient ID: Kathryn Fox Threats, female   DOB: 24-Jul-1991, 24 y.o.   MRN: [id number]  HPI  Kathryn Fox is a 22 yo F presenting with depression on a same day visit. She was also complaining of neck spasms and vaginal odor.   #depression: She attributes her depression to losing her job in August. She was working out 4 x a week with her boyfriend to make herself feel better about losing her job.  This was working until her boyfriend broke up with her a couple of days prior to [employer]. She feels like everything is falling apart. She is having increased the time she sleeps to 14-16 hours a day. She has lost interest in going to the gym but this is complicated by not being able to afford it. She feels guilty about losing her job because this puts a larger financial strain on her mother. She has somewhat has a decrease in concentration. She has a decreased appetite with eating only 1 meal a day.  She feels more fatigued most all day. She has not made any plans to kill herself but feels like running away. She just wants to get away from her problems. She does not have any history of depression. She has felt down before but never to the point of needing medication or counseling. She was seen by a counselor when she was 22yo for an anger issue. She doesn't report any more anger issues and feels like she "grew out of it." She is not in school but plans on applying in the spring. She used to be enrolled at [employer] for a reported learning disability. She hasn't talked to anyone about her feelings. She states if she told her mother about her feelings then her mother would go "crazy." She denies any increased spending or sexual encounters. She felt that she had more energy on the day of Halloween but this was unsustained. She denies auditory or visual hallucinations.   PHQ - 9: 26  States that she is open to counseling or starting a medication.   I did not evaluate her other concerns but stated that she  could bring these up with her PCP.   Review of Systems All other systems reviewed and otherwise normal.      Objective:   Physical Exam BP 117/76  Pulse 76  Temp(Src) 98.2 F (36.8 C) (Oral)  Wt 124 lb (56.246 kg)  LMP 05/29/2013 General: tearful during exam, normal affect,     Assessment:     Depression.      Plan:     I spent 40 minutes with the patient with greater than 20 minutes spent counseling the patient.

## 2013-05-29 NOTE — Assessment & Plan Note (Signed)
PHQ-9 score indicates that she is severely depressed. Reassured she is not bipolar or schizophrenic.  - start prozac due to its prevalence for increasing energy in a patient due to her hypersomnolence  - referral to Dr. Pascal Lux as she was open to counseling and this may be an acute episode  - f/u with PCP in 7-10 to make sure she is not feeling worse and the medication is helping relieve some of her symptoms.

## 2013-05-29 NOTE — Patient Instructions (Addendum)
Thank you for coming in,   I recommend that you meet with our psychologist, Dr. Spero Geralds, for help dealing with your depression.  You can schedule an appointment with her by calling her directly at [phone].  I am also starting a medication called Prozac. This medication may cause hives, inability to sit still, itching, restlessness, skin rash or nausea. If any of these things occur then call and we may changed your medication.   I would like you to follow up with your primary doctor in 7-10 days to make sure that you are not feeling worse. At that time he can also answer your other concerns.    Please feel free to call with any questions or concerns at any time, at [phone]. --Dr. Jordan Likes

## 2013-08-22 ENCOUNTER — Other Ambulatory Visit (HOSPITAL_COMMUNITY)
Admission: RE | Admit: 2013-08-22 | Discharge: 2013-08-22 | Disposition: A | Discharge status: Home / Self Care | Payer: No Typology Code available for payment source | Source: Ambulatory Visit | Attending: Family Medicine | Admitting: Family Medicine

## 2013-08-22 ENCOUNTER — Ambulatory Visit (INDEPENDENT_AMBULATORY_CARE_PROVIDER_SITE_OTHER): Payer: No Typology Code available for payment source | Admitting: Family Medicine

## 2013-08-22 ENCOUNTER — Encounter: Payer: Self-pay | Admitting: Family Medicine

## 2013-08-22 VITALS — BP 122/70 | HR 82 | Temp 98.6°F | Ht 64.0 in | Wt 127.0 lb

## 2013-08-22 DIAGNOSIS — Z23 Encounter for immunization: Secondary | ICD-10-CM

## 2013-08-22 DIAGNOSIS — Z113 Encounter for screening for infections with a predominantly sexual mode of transmission: Secondary | ICD-10-CM | POA: Insufficient documentation

## 2013-08-22 DIAGNOSIS — Z01419 Encounter for gynecological examination (general) (routine) without abnormal findings: Secondary | ICD-10-CM | POA: Insufficient documentation

## 2013-08-22 DIAGNOSIS — Z0000 Encounter for general adult medical examination without abnormal findings: Secondary | ICD-10-CM

## 2013-08-22 DIAGNOSIS — N760 Acute vaginitis: Secondary | ICD-10-CM

## 2013-08-22 DIAGNOSIS — Z124 Encounter for screening for malignant neoplasm of cervix: Secondary | ICD-10-CM

## 2013-08-22 LAB — CBC
HCT: 36.9 % (ref 36.0–46.0)
Hemoglobin: 12.4 g/dL (ref 12.0–15.0)
MCH: 28.1 pg (ref 26.0–34.0)
MCHC: 33.6 g/dL (ref 30.0–36.0)
MCV: 83.7 fL (ref 78.0–100.0)
Platelets: 275 K/uL (ref 150–400)
RBC: 4.41 MIL/uL (ref 3.87–5.11)
RDW: 12.9 % (ref 11.5–15.5)
WBC: 7 K/uL (ref 4.0–10.5)

## 2013-08-22 LAB — COMPREHENSIVE METABOLIC PANEL
ALT: 58 U/L — ABNORMAL HIGH (ref 0–35)
AST: 33 U/L (ref 0–37)
Albumin: 3.4 g/dL — ABNORMAL LOW (ref 3.5–5.2)
Alkaline Phosphatase: 49 U/L (ref 39–117)
BILIRUBIN TOTAL: 0.3 mg/dL (ref 0.2–1.2)
BUN: 9 mg/dL (ref 6–23)
CALCIUM: 8.4 mg/dL (ref 8.4–10.5)
CHLORIDE: 105 meq/L (ref 96–112)
CO2: 28 mEq/L (ref 19–32)
Creat: 0.65 mg/dL (ref 0.50–1.10)
GLUCOSE: 76 mg/dL (ref 70–99)
Potassium: 4.1 mEq/L (ref 3.5–5.3)
Sodium: 138 mEq/L (ref 135–145)
Total Protein: 5.5 g/dL — ABNORMAL LOW (ref 6.0–8.3)

## 2013-08-22 LAB — POCT WET PREP (WET MOUNT): Clue Cells Wet Prep Whiff POC: POSITIVE

## 2013-08-22 LAB — LIPID PANEL
Cholesterol: 144 mg/dL (ref 0–200)
HDL: 52 mg/dL
LDL Cholesterol: 82 mg/dL (ref 0–99)
Total CHOL/HDL Ratio: 2.8 ratio
Triglycerides: 48 mg/dL
VLDL: 10 mg/dL (ref 0–40)

## 2013-08-22 NOTE — Patient Instructions (Addendum)
Kathryn Fox, it was a pleasure seeing you today. Today we talked about your pap smear, some testing and your routine laboratory blood work. Your pap test will be sent in to be evaluated and I will inform you of the results when they are available. Regarding your testing, I will call you with the results as well. Please see me again in about 2 weeks to discuss your trouble with getting pregnant. You also received the flu vaccine, Hep A vaccine and HPV vaccine. If you need to be seen sooner, please do not hesitate to call.   If you have any questions or concerns, please do not hesitate to call the office at [phone].  Sincerely,  Cordelia Poche, MD   Pap Test A Pap test checks the cells on the surface of your cervix. Your doctor will look for cell changes that are not normal, an infection, or cancer. If the cells no longer look normal, it is called dysplasia. Dysplasia can turn into cancer. Regular Pap tests are important to stop cancer from developing. BEFORE THE PROCEDURE  Ask your doctor when to schedule your Pap test. Timing the test around your period may be important.  Do not douche or have sex (intercourse) for 24 hours before the test.  Do not put creams on your vagina or use tampons for 24 hours before the test.  Go pee (urinate) just before the test. PROCEDURE  You will lie on an exam table with your feet in stirrups.  A warm metal or plastic tool (speculum) will be put in your vagina to open it up.  Your doctor will use a small, plastic brush or wooden spatula to take cells from your cervix.  The cells will be put in a lab container.  The cells will be checked under a microscope to see if they are normal or not. AFTER THE PROCEDURE Get your test results. If they are abnormal, you may need more tests. Document Released: 08/12/2010 Document Revised: 10/02/2011 Document Reviewed: 07/06/2011 [facility] Patient Information 2014 [place], [place].

## 2013-08-22 NOTE — Progress Notes (Signed)
   Subjective:    Patient ID: Kathryn Fox, female    DOB: Sep 25, 1990, 23 y.o.   MRN: [id number]  HPI  Annual Gynecological Exam  [id number]  Wt Readings from Last 3 Encounters:  08/22/13 127 lb (57.607 kg)  05/29/13 124 lb (56.246 kg)  08/02/12 109 lb 9.6 oz (49.714 kg)   Last period: 08/09/2013 Regular periods: Occurring every month. Sometimes twice per month. Heavy bleeding: Uses three at once, changed 3 times per day for one day. Second day, 2 tampons changed twice. Third-fourth day, 1 tampon changed 3 times.  Sexually active: yes, two people in last year. 4 lifetime. Currently one person, has been with him for four years. Birth control or hormonal therapy: No Hx of STD: Patient desires STD screening Dyspareunia: No Hot flashes: No Vaginal discharge: No Dysuria:No No  Breast mass or concerns: Right breast more normal than left. Left seems elongated, first noticed last year around August, causing pain every couple of weeks, happening about one week before periods. No discharge, no redness, no pain. Last Pap: 2012  History of abnormal pap: No   FH of breast, uterine, ovarian, colon cancer: Breast cancer in aunt.    Review of Systems Refer to HPI    Objective:   Physical Exam  Vitals reviewed. Constitutional: She is oriented to person, place, and time. She appears well-developed and well-nourished.  Cardiovascular: Normal rate, regular rhythm and normal heart sounds.   Pulmonary/Chest: Effort normal and breath sounds normal.  Abdominal: Soft. Bowel sounds are normal.  Genitourinary: Vagina normal. There is no lesion on the right labia. There is no lesion on the left labia. Uterus is not tender. Cervix exhibits no motion tenderness, no discharge and no friability. Right adnexum displays no mass and no tenderness. Left adnexum displays no mass and no tenderness.  Neurological: She is alert and oriented to person, place, and time.  Skin: Skin is warm and dry.         Assessment & Plan:

## 2013-08-29 DIAGNOSIS — Z0000 Encounter for general adult medical examination without abnormal findings: Secondary | ICD-10-CM | POA: Insufficient documentation

## 2013-08-29 NOTE — Assessment & Plan Note (Signed)
Pap smear, lipid panel, CMP, CBC. Patient to receive Hep A vaccine and first of three HPV vaccinations

## 2013-10-06 ENCOUNTER — Telehealth: Payer: Self-pay | Admitting: *Deleted

## 2013-10-06 NOTE — Telephone Encounter (Signed)
Message copied by Johny Shears on Mon Oct 06, 2013  8:51 AM ------      Message from: Cordelia Poche A      Created: Sat Oct 04, 2013  8:10 AM       Please call patient to have her schedule an appointment for a colposcopy. Her pap smear was abnormal. Her other labs were essentially normal. Gonorrhea and chlamydia negative. ------

## 2013-10-06 NOTE — Telephone Encounter (Signed)
LVM for patient to call back. ?

## 2013-10-17 ENCOUNTER — Ambulatory Visit (INDEPENDENT_AMBULATORY_CARE_PROVIDER_SITE_OTHER): Payer: No Typology Code available for payment source | Admitting: Family Medicine

## 2013-10-17 ENCOUNTER — Encounter: Payer: Self-pay | Admitting: Family Medicine

## 2013-10-17 VITALS — BP 123/75 | HR 80 | Temp 98.4°F | Ht 64.0 in | Wt 134.4 lb

## 2013-10-17 DIAGNOSIS — M542 Cervicalgia: Secondary | ICD-10-CM

## 2013-10-17 DIAGNOSIS — Z319 Encounter for procreative management, unspecified: Secondary | ICD-10-CM

## 2013-10-17 DIAGNOSIS — G8929 Other chronic pain: Secondary | ICD-10-CM

## 2013-10-17 LAB — POCT URINE PREGNANCY: Preg Test, Ur: NEGATIVE

## 2013-10-17 MED ORDER — KETOROLAC TROMETHAMINE 60 MG/2ML IM SOLN
60.0000 mg | Freq: Once | INTRAMUSCULAR | Status: AC
  Administered 2013-10-17: 60 mg via INTRAMUSCULAR

## 2013-10-17 NOTE — Patient Instructions (Addendum)
Kathryn Fox, it was a pleasure seeing you today. Today we talked about your neck pain abnormal pap smear and infertility questions.   1. Neck pain: I injected some lidocaine into your neck muscle to help with pain. Hopefully you will start to feel some improvement in the next few days. I am also including some neck exercises for you to complete. 2. Infertility: I will have you go to women's clinic so they can work you up for infertility. As far as I know, they do require an upfront charge of $250 if you have no insurance. 3. Abnormal pap smear: Please make an appointment to have a colposcopy in 1-2 weeks.  If you have any questions or concerns, please do not hesitate to call the office at [phone].  Sincerely,  Cordelia Poche, MD

## 2013-10-17 NOTE — Progress Notes (Signed)
   Subjective:    Patient ID: Kathryn Fox, female    DOB: 1990/09/06, 23 y.o.   MRN: [id number]  HPI  Neck pain Patient has a 7 year history of neck pain. She had recently been symptoms free for the past 6 months, but symptoms returned yesterday. Pain is constant and located initially on the right side of her neck and pain radiates to the top of her scalp and to the front. Sometimes the pain is on the contralateral side. She has used icy/hot, warm and cold compresses and has seen a chiropractor with minimal improvement in symptoms. She has used Excedrin which has helped in the past, but is not working as well as before.  Desire for pregnancy Patient has been trying to get pregnant for the past 1 year. Her fiance has already been tested for infertility and cleared. She states she has used clomiphene in the past with no success. She has regular periods that are heavy.  Review of Systems  Constitutional: Negative for fever.  Eyes: Positive for photophobia and visual disturbance.  Musculoskeletal: Positive for neck pain. Negative for neck stiffness.  Neurological: Positive for headaches.       Objective:   Physical Exam  Constitutional: She is oriented to person, place, and time. She appears well-developed.  Musculoskeletal:       Cervical back: She exhibits tenderness (located mainly on right sided with trigger point tenderness) and spasm. She exhibits normal range of motion and no edema.  Neurological: She is alert and oriented to person, place, and time.    INJECTION: Patient was given informed consent, signed copy in the chart. Appropriate time out was taken. Area prepped with iodine and alcohol. 2 cc of 1% lidocaine without epinephrine was injected into the right trapezius using a fanning technique. The patient tolerated the procedure well. There were no complications. Post procedure instructions were given.     Assessment & Plan:

## 2013-10-18 DIAGNOSIS — Z319 Encounter for procreative management, unspecified: Secondary | ICD-10-CM | POA: Insufficient documentation

## 2013-10-18 NOTE — Assessment & Plan Note (Signed)
Patient has trapezius spasm. Injected lidocaine into area and gave shot of tramadol for pain.

## 2013-10-18 NOTE — Assessment & Plan Note (Addendum)
Gave patient information on resources available at [facility] regarding fertility. Also recommended she try an ovulation test, although she appears to be ovulating since she is having regular periods.

## 2013-10-30 ENCOUNTER — Ambulatory Visit (INDEPENDENT_AMBULATORY_CARE_PROVIDER_SITE_OTHER): Payer: No Typology Code available for payment source | Admitting: Family Medicine

## 2013-10-30 VITALS — BP 124/75 | HR 70 | Temp 99.3°F | Wt 138.0 lb

## 2013-10-30 DIAGNOSIS — R87619 Unspecified abnormal cytological findings in specimens from cervix uteri: Secondary | ICD-10-CM

## 2013-10-30 DIAGNOSIS — IMO0002 Reserved for concepts with insufficient information to code with codable children: Secondary | ICD-10-CM

## 2013-10-30 DIAGNOSIS — R6889 Other general symptoms and signs: Secondary | ICD-10-CM

## 2013-10-30 HISTORY — DX: Reserved for concepts with insufficient information to code with codable children: IMO0002

## 2013-10-30 LAB — POCT URINE PREGNANCY: PREG TEST UR: NEGATIVE

## 2013-10-30 NOTE — Progress Notes (Signed)
Patient ID: Kathryn Fox, female   DOB: January 13, 1991, 23 y.o.   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 Patient given informed consent, signed copy in the chart.  Placed in lithotomy position. Cervix viewed with speculum and colposcope after application of acetic acid.   Colposcopy adequate (entire squamocolumnar junctions seen  in entirety) ?  Yes Acetowhite lesions?No Punctation?No Mosaicism?  No no Abnormal vasculature?  No Biopsies?No ECC?No Complications? No  COMMENTS: Patient was given post procedure instructions.   Given her normal clinical exam under colposcopy, low-grade SIL on Pap and age I would recommend could test Pap smear in one year. We discussed the

## 2013-12-10 ENCOUNTER — Ambulatory Visit: Payer: No Typology Code available for payment source

## 2013-12-24 ENCOUNTER — Encounter: Payer: Self-pay | Admitting: Sports Medicine

## 2013-12-24 ENCOUNTER — Ambulatory Visit (INDEPENDENT_AMBULATORY_CARE_PROVIDER_SITE_OTHER): Payer: No Typology Code available for payment source | Admitting: Sports Medicine

## 2013-12-24 ENCOUNTER — Other Ambulatory Visit (HOSPITAL_COMMUNITY)
Admission: RE | Admit: 2013-12-24 | Discharge: 2013-12-24 | Disposition: A | Discharge status: Home / Self Care | Payer: No Typology Code available for payment source | Source: Ambulatory Visit | Attending: Sports Medicine | Admitting: Sports Medicine

## 2013-12-24 VITALS — BP 118/73 | HR 81 | Temp 98.4°F | Ht 64.0 in | Wt 134.0 lb

## 2013-12-24 DIAGNOSIS — IMO0002 Reserved for concepts with insufficient information to code with codable children: Secondary | ICD-10-CM

## 2013-12-24 DIAGNOSIS — R590 Localized enlarged lymph nodes: Secondary | ICD-10-CM

## 2013-12-24 DIAGNOSIS — Z113 Encounter for screening for infections with a predominantly sexual mode of transmission: Secondary | ICD-10-CM | POA: Insufficient documentation

## 2013-12-24 DIAGNOSIS — R6889 Other general symptoms and signs: Secondary | ICD-10-CM

## 2013-12-24 DIAGNOSIS — R599 Enlarged lymph nodes, unspecified: Secondary | ICD-10-CM

## 2013-12-24 DIAGNOSIS — N644 Mastodynia: Secondary | ICD-10-CM

## 2013-12-24 DIAGNOSIS — Z202 Contact with and (suspected) exposure to infections with a predominantly sexual mode of transmission: Secondary | ICD-10-CM

## 2013-12-24 LAB — POCT URINE PREGNANCY: Preg Test, Ur: NEGATIVE

## 2013-12-24 MED ORDER — MELOXICAM 15 MG PO TABS: 15.0000 mg | ORAL_TABLET | Freq: Every day | ORAL | Status: DC

## 2013-12-24 NOTE — Progress Notes (Signed)
  Kathryn Fox - 23 y.o. female MRN [id number]  Date of birth: 01/01/1991  CC, SUBJECTIVE & ROS:     If applicable, see problem based charting for additional problem specific documentation. Chief Complaint  Patient presents with  . left groin mass    since sunday; just noticed; no pain, no dyscharge   HISTORY: Past Medical, Surgical, Social, and Family History Reviewed & Updated per EMR.  Pertinent Historical Findings include: Depression, abnormal Pap smear - subsequent followup colposcopy negative.  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 In no discomfort; no respiratory distress PSYCH:  alert and appropriate, good insight  HNEENT:  mmm, no JVD CARDIAC:  RRR, S1/S2 heard, no murmur LUNGS:  CTA B, no wheezes, no crackles Lymphatic:  Small mobile approximately 5 mm left mid inguinal mass, with mild tenderness.  No surrounding erythema.  No surrounding skin/hair changes or left lower extremity skin changes.   Breast:  Dense breast tissue.  No focal mass.  Mildly tender diffusely  ASSESSMENT & PLAN: See problem based charting & AVS for pt instructions.

## 2013-12-26 NOTE — Assessment & Plan Note (Signed)
Problem Based Documentation:    Subjective Report:  Acute onset of left inguinal discomfort and palpable mass.  Patient denies any other symptoms including vaginal discharge, dysuria, frequency, hesitancy, fevers, chills, cough, congestion, weight change.  She is in a monogamous relationship.     Assessment & Plan & Follow up Issues:  Encounter Diagnoses  Name Primary?  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 Possible exposure to STD   . Inguinal adenopathy   Acute conditions  - mass appears to be consistent with acute left inguinal lymphadenopathy.  This is likely representative of a reactive lymph node however I have some concerns regarding her most recent Pap smear that was positive for LSIL.  She underwent colonoscopy without endocervical curettage.  We discussed the expected course of acute nonspecific lymphadenitis and she is agreeable to symptomatic treatment and watchful waiting.  Did express to her my concerns that if this does not improve then we will need to evaluate for pelvic etiology.  Although asymptomatic there is a possibility this could be representative of a sexual transmitted infection we will test for that. 1. Urine GC chlamydia. 2. Urine pregnancy negative 3. Meloxicam for symptomatic relief. > Follow up with PCP if any worsening or not improving in the next 1-2 weeks.

## 2013-12-26 NOTE — Assessment & Plan Note (Signed)
Problem Based Documentation:    Subjective Report:  Patient reports some enlargement of her breasts over the past couple of months with no other changes including no new masses, skin changes or nipple changes.  No discharge.     Assessment & Plan & Follow up Issues:  Acute on chronic condition  - no history of fibrocystic breast changes.  No masses slightly enlarging now that more physically active. 1. Likely due to increased activity.  Encouraged good support including well fitting bra    > Could be a candidate for breast reduction surgery

## 2014-02-19 ENCOUNTER — Emergency Department (HOSPITAL_COMMUNITY)
Admission: EM | Admit: 2014-02-19 | Discharge: 2014-02-19 | Disposition: A | Discharge status: Home / Self Care | Payer: No Typology Code available for payment source | Attending: Emergency Medicine | Admitting: Emergency Medicine

## 2014-02-19 ENCOUNTER — Encounter (HOSPITAL_COMMUNITY): Payer: Self-pay | Admitting: Emergency Medicine

## 2014-02-19 ENCOUNTER — Emergency Department (HOSPITAL_COMMUNITY): Payer: No Typology Code available for payment source

## 2014-02-19 DIAGNOSIS — IMO0001 Reserved for inherently not codable concepts without codable children: Secondary | ICD-10-CM

## 2014-02-19 DIAGNOSIS — G8929 Other chronic pain: Secondary | ICD-10-CM | POA: Insufficient documentation

## 2014-02-19 DIAGNOSIS — R079 Chest pain, unspecified: Secondary | ICD-10-CM | POA: Insufficient documentation

## 2014-02-19 DIAGNOSIS — R141 Gas pain: Secondary | ICD-10-CM | POA: Insufficient documentation

## 2014-02-19 DIAGNOSIS — R142 Eructation: Secondary | ICD-10-CM

## 2014-02-19 DIAGNOSIS — K219 Gastro-esophageal reflux disease without esophagitis: Secondary | ICD-10-CM

## 2014-02-19 DIAGNOSIS — Z87891 Personal history of nicotine dependence: Secondary | ICD-10-CM | POA: Insufficient documentation

## 2014-02-19 DIAGNOSIS — R143 Flatulence: Secondary | ICD-10-CM

## 2014-02-19 LAB — BASIC METABOLIC PANEL
Anion gap: 11 (ref 5–15)
BUN: 10 mg/dL (ref 6–23)
CO2: 24 mEq/L (ref 19–32)
CREATININE: 0.64 mg/dL (ref 0.50–1.10)
Calcium: 8.9 mg/dL (ref 8.4–10.5)
Chloride: 105 mEq/L (ref 96–112)
GFR calc non Af Amer: >90 mL/min (ref >90)
Glucose, Bld: 89 mg/dL (ref 70–99)
Potassium: 3.8 mEq/L (ref 3.7–5.3)
Sodium: 140 mEq/L (ref 137–147)

## 2014-02-19 LAB — I-STAT TROPONIN, ED: Troponin i, poc: 0 ng/mL (ref 0.00–0.08)

## 2014-02-19 LAB — CBC
HCT: 34 % — ABNORMAL LOW (ref 36.0–46.0)
Hemoglobin: 10.9 g/dL — ABNORMAL LOW (ref 12.0–15.0)
MCH: 26.6 pg (ref 26.0–34.0)
MCHC: 32.1 g/dL (ref 30.0–36.0)
MCV: 82.9 fL (ref 78.0–100.0)
PLATELETS: 287 10*3/uL (ref 150–400)
RBC: 4.1 MIL/uL (ref 3.87–5.11)
RDW: 13.8 % (ref 11.5–15.5)
WBC: 10.5 10*3/uL (ref 4.0–10.5)

## 2014-02-19 MED ORDER — GI COCKTAIL ~~LOC~~
30.0000 mL | Freq: Once | ORAL | Status: AC
  Administered 2014-02-19: 30 mL via ORAL
  Filled 2014-02-19: qty 30

## 2014-02-19 MED ORDER — OMEPRAZOLE 20 MG PO CPDR: 20.0000 mg | DELAYED_RELEASE_CAPSULE | Freq: Every day | ORAL | Status: DC

## 2014-02-19 MED ORDER — DICYCLOMINE HCL 20 MG PO TABS: 20.0000 mg | ORAL_TABLET | Freq: Two times a day (BID) | ORAL | Status: DC

## 2014-02-19 MED ORDER — DICYCLOMINE HCL 10 MG/ML IM SOLN
20.0000 mg | Freq: Once | INTRAMUSCULAR | Status: AC
  Administered 2014-02-19: 20 mg via INTRAMUSCULAR
  Filled 2014-02-19: qty 2

## 2014-02-19 NOTE — ED Notes (Signed)
Patient transported to X-ray 

## 2014-02-19 NOTE — ED Notes (Signed)
Pt presents with sudden onset of Left side chest pain radiating up into her Left shoulder and pain with deep breathing, cough, or movement starting at approx 0005. Pt reports her pain is relieved when holding her Left axillary region. Pt reports chest congestion and a productive cough with yellow colored sputum for several days. Pt denies SOB, dizziness, nausea, vomiting, diarrhea, fever, back/abd pain, diaphoresis. Pt symptoms started after eating 2 plates of spaghetti but states she did not "stuff herself." pt with NAD

## 2014-02-19 NOTE — ED Provider Notes (Addendum)
CSN: [id number]     Arrival date & time 02/19/14  0030 History   First MD Initiated Contact with Patient 02/19/14 [phone]     Chief Complaint  Patient presents with  . Chest Pain     (Consider location/radiation/quality/duration/timing/severity/associated sxs/prior Treatment) Patient is a 23 y.o. female presenting with chest pain. The history is provided by the patient.  Chest Pain Pain location:  Epigastric and L chest Pain quality: pressure and stabbing   Radiates to: lower left axilla. Pain radiates to the back: no   Pain severity:  Severe Onset quality:  Sudden Timing:  Constant Progression:  Unchanged Chronicity:  New Context: not breathing   Relieved by:  Nothing Worsened by:  Nothing tried Ineffective treatments:  None tried Associated symptoms: no palpitations, no PND, no shortness of breath and not vomiting   Risk factors: not obese, no prior DVT/PE and no surgery   no OCP no long car trip or plane trips no OCP no leg pain or swelling.  Symptoms post fried chicken and gravy, spaghetti with red sauce   Past Medical History  Diagnosis Date  . Chronic abdominal pain    Past Surgical History  Procedure Laterality Date  . Breast fibroadenoma surgery     Family History  Problem Relation Age of Onset  . Hypertension Mother   . Hyperlipidemia Mother   . Diabetes Mother   . Cancer Maternal Aunt     Breast  . Diabetes Maternal Aunt   . Stroke Maternal Grandmother   . Cancer Paternal Grandfather    History  Substance Use Topics  . Smoking status: Former Smoker -- 0.20 packs/day    Types: Cigarettes    Quit date: 07/24/2013  . Smokeless tobacco: Former Systems developer  . Alcohol Use: No   OB History   Grav Para Term Preterm Abortions TAB SAB Ect Mult Living                 Review of Systems  Respiratory: Negative for shortness of breath.   Cardiovascular: Positive for chest pain. Negative for palpitations, leg swelling and PND.  Gastrointestinal: Negative for vomiting.   All other systems reviewed and are negative.     Allergies  Peanut-containing drug products; Milk-related compounds; and Septra  Home Medications   Prior to Admission medications   Not on File   BP 111/76  Pulse 71  Temp(Src) 99 F (37.2 C) (Oral)  Resp 14  SpO2 100%  LMP 02/13/2014 Physical Exam  Constitutional: She is oriented to person, place, and time. She appears well-developed and well-nourished. No distress.  HENT:  Head: Normocephalic and atraumatic.  Mouth/Throat: Oropharynx is clear and moist.  Eyes: Conjunctivae are normal. Pupils are equal, round, and reactive to light.  Neck: Normal range of motion. Neck supple.  Cardiovascular: Normal rate, regular rhythm and intact distal pulses.   Pulmonary/Chest: Effort normal and breath sounds normal. No respiratory distress. She has no wheezes. She has no rales.  Abdominal: Soft. Bowel sounds are increased. There is no tenderness. There is no rigidity, no rebound, no guarding, no tenderness at McBurney's point and negative Murphy's sign.  Hyperactive bowel sounds in to the chest.  Palpable stool in the colon  Musculoskeletal: Normal range of motion. She exhibits no edema and no tenderness.  Neurological: She is alert and oriented to person, place, and time.  Skin: Skin is warm and dry.  Psychiatric: She has a normal mood and affect.    ED Course  Procedures (including critical  care time) Labs Review Labs Reviewed  CBC - Abnormal; Notable for the following:    Hemoglobin 10.9 (*)    HCT 34.0 (*)    All other components within normal limits  BASIC METABOLIC PANEL  I-STAT TROPOININ, ED    Imaging Review Dg Chest 2 View  02/19/2014   CLINICAL DATA:  Left-sided chest pain shortness of breath.  EXAM: CHEST  2 VIEW  COMPARISON:  None.  FINDINGS: The heart size and mediastinal contours are within normal limits. Both lungs are clear. The visualized skeletal structures are unremarkable.  IMPRESSION: Normal chest.    Electronically Signed   By: Rozetta Nunnery M.D.   On: 02/19/2014 01:13     EKG Interpretation   Date/Time:  Thursday February 19 2014 00:41:55 EDT Ventricular Rate:  81 PR Interval:  156 QRS Duration: 84 QT Interval:  334 QTC Calculation: 387 R Axis:   80 Text Interpretation:  Sinus rhythm Confirmed by [facility]  MD, Senica Crall  ([id number]) on 02/19/2014 3:39:43 AM      MDM   Final diagnoses:  None  Highly doubt acs.  PERC negative wells 0, highly doubt PE  Burped and passed large gas and symptons relieved.  Symptoms consistent with gas and gerd.  Also with constipation.  Will treat   Treveon Bourcier K Nishanth Mccaughan-Rasch, MD 02/19/14 [id number]  Terre Hanneman K Panzy Bubeck-Rasch, MD 02/19/14 [phone]

## 2014-02-19 NOTE — ED Notes (Signed)
Per [facility] pt developed sudden onset of Left side chest pain under her Left breast radiating up to her Left shoulder and is relieved when squeezing her axillary region. Pt unable to take a deep breath due to increase pain, movement or cough. Pt reports having flu like symptoms last week. All lung filed's are clear bilateral. No other cardiac symptoms related to chest pain. Pt's symptoms started after eating 2 plates of spaghetti. VSS BP 112/84,  HR 90, RR20

## 2014-02-19 NOTE — ED Notes (Signed)
Pt denies being on any oral contraception.

## 2014-05-04 ENCOUNTER — Encounter: Payer: Self-pay | Admitting: Family Medicine

## 2014-05-04 ENCOUNTER — Ambulatory Visit (INDEPENDENT_AMBULATORY_CARE_PROVIDER_SITE_OTHER): Payer: Self-pay | Admitting: Family Medicine

## 2014-05-04 VITALS — BP 146/68 | HR 89 | Temp 98.6°F | Wt 147.0 lb

## 2014-05-04 DIAGNOSIS — B009 Herpesviral infection, unspecified: Secondary | ICD-10-CM | POA: Insufficient documentation

## 2014-05-04 DIAGNOSIS — N766 Ulceration of vulva: Secondary | ICD-10-CM

## 2014-05-04 MED ORDER — BACITRACIN 500 UNIT/GM EX OINT: 1.0000 "application " | TOPICAL_OINTMENT | Freq: Two times a day (BID) | CUTANEOUS | Status: DC

## 2014-05-04 NOTE — Progress Notes (Signed)
   Subjective:    Patient ID: Kathryn Fox, female    DOB: January 01, 1991, 23 y.o.   MRN: [id number]  Patient presents for a same day appointment.  HPI  SORE ON LABIA: - Reported she had a burning sensation on inner labia last Tuesday (10/6), used mirror and saw swelling, admits to some pus/brown discharge. Admits to pain with any touch or ambulation (due to pressure/movement on spot). States that she is unsure exactly how it happened but thinks that she may have shaved that area and more likely thinks it may have been "pinched" while using a tampon recently - Tried Neosporin, Vaseline. Haven't tried any pain medicines  - Admits to dysuria / burning seems external only when sore is touched - Denies fevers/chills, abdominal, nausea / vomiting, vaginal discharge or bleeding - Currently sexually active, 1 partner x 3 years, last intercourse about 2.5 - 3 weeks ago, no condoms - No prior STDs - LMP 1 week ago (T- Sat)  I have reviewed and updated the following as appropriate: allergies and current medications  Social Hx: - Former smoker  Review of Systems  See above HPI    Objective:   Physical Exam  BP 146/68  Pulse 89  Temp(Src) 98.6 F (37 C) (Oral)  Wt 147 lb (66.679 kg)  LMP 04/27/2014  Gen - well-appearing, cooperative, NAD HEENT - oropharynx clear, MMM Abd - soft, non-tender, non-distended Skin - warm, dry, no rashes Pelvic Exam (External GU exam only) - Normal external female genitalia except 1x1 cm superficial skin abrasion asymmetrical without bleeding, drainage, no area of palpable fluctuance or induration, no erythema, no vesicles. No other lesions. +moderate tenderness to direct palpation, otherwise labia non-tender. Normal appearing vaginal os without significant discharge. Speculum not utilized for exam.  Chaperoned by nurse staff.      Assessment & Plan:   See specific A&P problem list for details.

## 2014-05-04 NOTE — Patient Instructions (Signed)
Dear Kathryn Fox, Thank you for coming in to clinic today.  1. For your ulceration, it is most likely a skin abrasion that needs to heal. However, we are concerned for a Herpes infection. We sent a swab culture to the lab it may take a few days to get results. We will notify you with these, call if you have questions. Sent in Bacitracin ointment to Pharmacy: [id number] - [place], [place] - [address] AT [place] [Patient Preferred 2. If you have any new or concerning symptoms, please call or return to clinic for re-evaluation. 3. You may take Tylenol and ibuprofen as needed for pain.  Please schedule a follow-up appointment with Dr. Lonny Prude in 1-2 weeks for follow-up if persistent symptoms.  If you have any other questions or concerns, please feel free to call the clinic to contact me. You may also schedule an earlier appointment if necessary.  However, if your symptoms get significantly worse, please go to the Emergency Department to seek immediate medical attention.  [name], [place]

## 2014-05-05 NOTE — Assessment & Plan Note (Signed)
Single 1x1 cm asymmetric superficial abrasion on lower right external labia majora, may have previously been blister or abrasion, no other lesions identified, history with possible abrasion due to tampon / shaving, no evidence of abscess or infection, otherwise concern for possible HSV vesicular lesion since ruptured and base of ulcer remaining  Plan: 1. Collected HSV culture swab from base of ulcer 2. Rx Bacitracin for empiric topical protection and treatment if due to abrasion 3. RTC if not healing or worsening symptoms. Consider further STD testing, recent tests including HIV negative 12/2012.

## 2014-05-12 ENCOUNTER — Telehealth: Payer: Self-pay | Admitting: Family Medicine

## 2014-05-12 DIAGNOSIS — B009 Herpesviral infection, unspecified: Secondary | ICD-10-CM

## 2014-05-12 LAB — HERPES SIMPLEX VIRUS CULTURE: ORGANISM ID, BACTERIA: DETECTED

## 2014-05-12 MED ORDER — VALACYCLOVIR HCL 1 G PO TABS: 1000.0000 mg | ORAL_TABLET | Freq: Two times a day (BID) | ORAL | Status: DC

## 2014-05-12 NOTE — Telephone Encounter (Signed)
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 Discussed this result with patient. She was appropriately concerned and shocked by this news. Describes that symptoms have still been persistent since last OV with painful genital ulcer. She still admits to being sexually active.   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 Strongly advised to avoid sexual intercourse during active outbreak, partner wear condoms, and investigate partner with blood tests to determine if also infected. Additionally, strongly recommend that patient schedule close follow-up with PCP Dr. Lonny Prude within 1-2 weeks for further evaluation with more comprehensive STD testing (HIV, RPR, GC/Chlamydia, consider blood HSV2 glycoprotein IgG to determine if chronic or if primary infection), also to discuss chronic HSV2 management and future treatment. Patient understood, questions answered, will call to follow-up today.  [name], [place], PGY-2

## 2014-05-13 ENCOUNTER — Ambulatory Visit (INDEPENDENT_AMBULATORY_CARE_PROVIDER_SITE_OTHER): Payer: Self-pay | Admitting: Family Medicine

## 2014-05-13 ENCOUNTER — Other Ambulatory Visit (HOSPITAL_COMMUNITY)
Admission: RE | Admit: 2014-05-13 | Discharge: 2014-05-13 | Disposition: A | Discharge status: Home / Self Care | Payer: Self-pay | Source: Ambulatory Visit | Attending: Family Medicine | Admitting: Family Medicine

## 2014-05-13 ENCOUNTER — Encounter: Payer: Self-pay | Admitting: Family Medicine

## 2014-05-13 VITALS — BP 123/79 | HR 92 | Temp 98.4°F | Ht 64.0 in | Wt 146.0 lb

## 2014-05-13 DIAGNOSIS — N760 Acute vaginitis: Secondary | ICD-10-CM | POA: Insufficient documentation

## 2014-05-13 DIAGNOSIS — Z113 Encounter for screening for infections with a predominantly sexual mode of transmission: Secondary | ICD-10-CM

## 2014-05-13 DIAGNOSIS — Z202 Contact with and (suspected) exposure to infections with a predominantly sexual mode of transmission: Secondary | ICD-10-CM

## 2014-05-13 DIAGNOSIS — B009 Herpesviral infection, unspecified: Secondary | ICD-10-CM

## 2014-05-13 NOTE — Patient Instructions (Signed)
It was nice to see you today.  We will call you with the results.   Be sure to take the valtrex and follow up with Dr. Lonny Prude if it recurs (or fails to resolve).

## 2014-05-13 NOTE — Assessment & Plan Note (Signed)
HIV, RPR and GC/Chlamydia done today.

## 2014-05-13 NOTE — Progress Notes (Signed)
   Subjective:    Patient ID: Kathryn Fox, female    DOB: 1990-11-08, 23 y.o.   MRN: [id number]  HPI 23 year old female presents for same day appointment for STD testing.  Patient was recently seen on 10/12 with a genital ulcer.  HSV PCR returned positive.  Patient was encouraged to follow with PCP for STD testing given the diagnosis of genital herpes.  Patient presents today for STD testing.  She has no current complaints.  Denies vaginal discharge, dysuria, fever, chills, nausea, vomiting.  She states that she is currently sexually active and is now practicing safe sex (she was not doing this previously).  She states she has talked to her partner about the recent diagnosis.  Review of Systems Per HPI    Objective:   Physical Exam Filed Vitals:   05/13/14 1132  BP: 123/79  Pulse: 92  Temp: 98.4 F (36.9 C)   Exam: General: well appearing female in NAD. Cardiovascular: RRR. No murmurs, rubs, or gallops. Respiratory: CTAB. No rales, rhonchi, or wheeze. Abdomen: soft, nontender, nondistended.    Assessment & Plan:  See Problem List

## 2014-05-13 NOTE — Assessment & Plan Note (Signed)
Advised patient to take valtrex as prescribed. If recurs, will need to discuss suppressive therapy with PCP.

## 2014-05-14 LAB — URINE CYTOLOGY ANCILLARY ONLY
Chlamydia: NEGATIVE
NEISSERIA GONORRHEA: NEGATIVE
Trichomonas: NEGATIVE

## 2014-05-14 LAB — HIV ANTIBODY (ROUTINE TESTING W REFLEX): HIV 1&2 Ab, 4th Generation: NONREACTIVE

## 2014-05-14 LAB — RPR

## 2014-05-15 ENCOUNTER — Telehealth: Payer: Self-pay | Admitting: *Deleted

## 2014-05-15 NOTE — Telephone Encounter (Signed)
Spoke with patient and informed her of below results 

## 2014-05-15 NOTE — Telephone Encounter (Signed)
Message copied by Johny Shears on Fri May 15, 2014  4:07 PM ------      Message from: Coral Spikes      Created: Thu May 14, 2014  4:49 PM       Please inform patient of negative STD testing.             Thanks            Jayce ------

## 2014-06-24 ENCOUNTER — Inpatient Hospital Stay (HOSPITAL_COMMUNITY): Payer: Medicaid Other

## 2014-06-24 ENCOUNTER — Emergency Department (HOSPITAL_COMMUNITY)
Admission: EM | Admit: 2014-06-24 | Discharge: 2014-06-24 | Disposition: A | Discharge status: Home / Self Care | Payer: No Typology Code available for payment source | Source: Home / Self Care | Attending: Emergency Medicine | Admitting: Emergency Medicine

## 2014-06-24 ENCOUNTER — Encounter (HOSPITAL_COMMUNITY): Payer: Self-pay | Admitting: Emergency Medicine

## 2014-06-24 ENCOUNTER — Inpatient Hospital Stay (HOSPITAL_COMMUNITY)
Admission: AD | Admit: 2014-06-24 | Discharge: 2014-06-24 | Disposition: A | Discharge status: Home / Self Care | Payer: Medicaid Other | Source: Ambulatory Visit | Attending: Obstetrics & Gynecology | Admitting: Obstetrics & Gynecology

## 2014-06-24 ENCOUNTER — Encounter (HOSPITAL_COMMUNITY): Payer: Self-pay

## 2014-06-24 DIAGNOSIS — Z3A08 8 weeks gestation of pregnancy: Secondary | ICD-10-CM | POA: Diagnosis not present

## 2014-06-24 DIAGNOSIS — O2341 Unspecified infection of urinary tract in pregnancy, first trimester: Secondary | ICD-10-CM | POA: Insufficient documentation

## 2014-06-24 DIAGNOSIS — O9989 Other specified diseases and conditions complicating pregnancy, childbirth and the puerperium: Secondary | ICD-10-CM

## 2014-06-24 DIAGNOSIS — Z87891 Personal history of nicotine dependence: Secondary | ICD-10-CM | POA: Insufficient documentation

## 2014-06-24 DIAGNOSIS — R10813 Right lower quadrant abdominal tenderness: Secondary | ICD-10-CM

## 2014-06-24 DIAGNOSIS — R109 Unspecified abdominal pain: Secondary | ICD-10-CM | POA: Insufficient documentation

## 2014-06-24 DIAGNOSIS — Z3201 Encounter for pregnancy test, result positive: Secondary | ICD-10-CM

## 2014-06-24 DIAGNOSIS — N949 Unspecified condition associated with female genital organs and menstrual cycle: Secondary | ICD-10-CM

## 2014-06-24 DIAGNOSIS — R1030 Lower abdominal pain, unspecified: Secondary | ICD-10-CM

## 2014-06-24 DIAGNOSIS — N390 Urinary tract infection, site not specified: Secondary | ICD-10-CM

## 2014-06-24 DIAGNOSIS — R102 Pelvic and perineal pain: Secondary | ICD-10-CM

## 2014-06-24 DIAGNOSIS — R350 Frequency of micturition: Secondary | ICD-10-CM

## 2014-06-24 DIAGNOSIS — K5900 Constipation, unspecified: Secondary | ICD-10-CM

## 2014-06-24 DIAGNOSIS — O26899 Other specified pregnancy related conditions, unspecified trimester: Secondary | ICD-10-CM

## 2014-06-24 LAB — WET PREP, GENITAL
Trich, Wet Prep: NONE SEEN
YEAST WET PREP: NONE SEEN

## 2014-06-24 LAB — POCT URINALYSIS DIP (DEVICE)
BILIRUBIN URINE: NEGATIVE
Glucose, UA: NEGATIVE mg/dL
Ketones, ur: NEGATIVE mg/dL
Leukocytes, UA: NEGATIVE
NITRITE: POSITIVE — AB
PH: 7 (ref 5.0–8.0)
Protein, ur: NEGATIVE mg/dL
Specific Gravity, Urine: 1.02 (ref 1.005–1.030)
Urobilinogen, UA: 1 mg/dL (ref 0.0–1.0)

## 2014-06-24 LAB — CBC
HCT: 34.3 % — ABNORMAL LOW (ref 36.0–46.0)
HEMOGLOBIN: 11.3 g/dL — AB (ref 12.0–15.0)
MCH: 27 pg (ref 26.0–34.0)
MCHC: 32.9 g/dL (ref 30.0–36.0)
MCV: 81.9 fL (ref 78.0–100.0)
Platelets: 307 10*3/uL (ref 150–400)
RBC: 4.19 MIL/uL (ref 3.87–5.11)
RDW: 14.6 % (ref 11.5–15.5)
WBC: 11.4 10*3/uL — ABNORMAL HIGH (ref 4.0–10.5)

## 2014-06-24 LAB — ABO/RH: ABO/RH(D): A POS

## 2014-06-24 LAB — POCT PREGNANCY, URINE: Preg Test, Ur: POSITIVE — AB

## 2014-06-24 LAB — HCG, QUANTITATIVE, PREGNANCY: hCG, Beta Chain, Quant, S: 80600 m[IU]/mL — ABNORMAL HIGH (ref <5)

## 2014-06-24 MED ORDER — CEPHALEXIN 500 MG PO CAPS: 500.0000 mg | ORAL_CAPSULE | Freq: Four times a day (QID) | ORAL | Status: DC

## 2014-06-24 NOTE — Discharge Instructions (Signed)

## 2014-06-24 NOTE — MAU Provider Note (Signed)
History     CSN: [id number]  Arrival date and time: 06/24/14 1356   None     Chief Complaint  Patient presents with  . Abdominal Pain  . Nausea  . Fatigue  . Vaginal Discharge   HPI   [name] is a 23 y.o. female G1P0 at [redacted]w[redacted]d who presents to MAU for abdominal pain. She went to urgent care first and was sent here for further evaluation; rule out ectopic. She had a positive home pregnancy test yesterday; the pain started 1 week ago. She does a lot of lifting with her job and wonders if this is the cause. The pain is located in the middle of her stomach and is cramp like pain.   OB History    Gravida Para Term Preterm AB TAB SAB Ectopic Multiple Living   1               Past Medical History  Diagnosis Date  . Chronic abdominal pain     Past Surgical History  Procedure Laterality Date  . Breast fibroadenoma surgery      Family History  Problem Relation Age of Onset  . Hypertension Mother   . Hyperlipidemia Mother   . Diabetes Mother   . Cancer Maternal Aunt     Breast  . Diabetes Maternal Aunt   . Stroke Maternal Grandmother   . Cancer Paternal Grandfather     History  Substance Use Topics  . Smoking status: Former Smoker -- 0.20 packs/day    Types: Cigarettes    Quit date: 07/24/2013  . Smokeless tobacco: Former Systems developer  . Alcohol Use: No    Allergies:  Allergies  Allergen Reactions  . Mushroom Extract Complex Anaphylaxis    Facial swelling  . Peanut-Containing Drug Products Anaphylaxis  . Milk-Related Compounds Nausea And Vomiting  . Septra [Sulfamethoxazole-Trimethoprim] Hives    Prescriptions prior to admission  Medication Sig Dispense Refill Last Dose  . bacitracin 500 UNIT/GM ointment Apply 1 application topically 2 (two) times daily. For 1 week or until healed. (Patient not taking: Reported on 06/24/2014) 15 g 0 Unknown at Unknown time  . dicyclomine (BENTYL) 20 MG tablet Take 1 tablet (20 mg total) by mouth 2 (two) times daily.  (Patient not taking: Reported on 06/24/2014) 20 tablet 0 Unknown at Unknown time  . omeprazole (PRILOSEC) 20 MG capsule Take 1 capsule (20 mg total) by mouth daily. (Patient not taking: Reported on 06/24/2014) 30 capsule 0 Unknown at Unknown time  . valACYclovir (VALTREX) 1000 MG tablet Take 1 tablet (1,000 mg total) by mouth 2 (two) times daily. (Patient not taking: Reported on 06/24/2014) 20 tablet 0 Unknown at Unknown time   Results for orders placed or performed during the hospital encounter of 06/24/14 (from the past 48 hour(s))  CBC     Status: Abnormal   Collection Time: 06/24/14  3:34 PM  Result Value Ref Range   WBC 11.4 (H) 4.0 - 10.5 K/uL   RBC 4.19 3.87 - 5.11 MIL/uL   Hemoglobin 11.3 (L) 12.0 - 15.0 g/dL   HCT 34.3 (L) 36.0 - 46.0 %   MCV 81.9 78.0 - 100.0 fL   MCH 27.0 26.0 - 34.0 pg   MCHC 32.9 30.0 - 36.0 g/dL   RDW 14.6 11.5 - 15.5 %   Platelets 307 150 - 400 K/uL  ABO/Rh     Status: None   Collection Time: 06/24/14  3:34 PM  Result Value Ref Range   ABO/RH(D)  A POS   hCG, quantitative, pregnancy     Status: Abnormal   Collection Time: 06/24/14  3:34 PM  Result Value Ref Range   hCG, Beta Chain, Quant, S 80600 (H) <5 mIU/mL    Comment:          GEST. AGE      CONC.  (mIU/mL)   <=1 WEEK        5 - 50     2 WEEKS       50 - 500     3 WEEKS       100 - 10,000     4 WEEKS     1,000 - 30,000     5 WEEKS     3,500 - 115,000   6-8 WEEKS     12,000 - 270,000    12 WEEKS     15,000 - 220,000        FEMALE AND NON-PREGNANT FEMALE:     LESS THAN 5 mIU/mL   Wet prep, genital     Status: Abnormal   Collection Time: 06/24/14  4:55 PM  Result Value Ref Range   Yeast Wet Prep HPF POC NONE SEEN NONE SEEN   Trich, Wet Prep NONE SEEN NONE SEEN   Clue Cells Wet Prep HPF POC FEW (A) NONE SEEN   WBC, Wet Prep HPF POC FEW (A) NONE SEEN    Comment: BACTERIA- TOO NUMEROUS TO COUNT  GC/Chlamydia Probe Amp     Status: None   Collection Time: 06/24/14  4:55 PM  Result Value Ref Range    CT Probe RNA NEGATIVE NEGATIVE   GC Probe RNA NEGATIVE NEGATIVE    Comment: (NOTE)                                                                                       **Normal Reference Range: Negative**      Assay performed using the Gen-Probe APTIMA COMBO2 (R) Assay. Acceptable specimen types for this assay include APTIMA Swabs (Unisex, endocervical, urethral, or vaginal), first void urine, and ThinPrep liquid based cytology samples. Performed at [facility], [place] [facility]     Status: None   Collection Time: 06/24/14  4:56 PM  Result Value Ref Range   Specimen Description OB CLEAN CATCH    Special Requests Normal    Culture  Setup Time      06/24/2014 21:30 Performed at [place] Performed at [facility]     Culture      NO GROWTH Note: NO GROUP B STREP (S.AGALACTIAE) ISOLATED                                                              Culture based screening of vaginal/anorectal swabs at  35 to [redacted] weeks gestation is required to rule out the carriage of Group B Streptococcus. Performed at [facility]  Report Status 06/25/2014 FINAL    US Ob Comp Less 14 Wks  06/24/2014   CLINICAL DATA:  Abdominal pain and pregnancy. Gestational age by LMP of 8 weeks 2 days.  EXAM: OBSTETRIC <14 WK [place] AND TRANSVAGINAL OB US  TECHNIQUE: Both transabdominal and transvaginal ultrasound examinations were performed for complete evaluation of the gestation as well as the maternal uterus, adnexal regions, and pelvic cul-de-sac. Transvaginal technique was performed to assess early pregnancy.  COMPARISON:  None.  FINDINGS: Intrauterine gestational sac: Visualized/normal in shape.  Yolk sac:  Visualized  Embryo:  Visualized  Cardiac Activity: Visualized  Heart Rate:  158 bpm  CRL:   18  mm   8 w 3 d                  [place] EDC: 01/31/2015  Maternal uterus/adnexae: Both ovaries are normal in appearance. No adnexal mass or free fluid  identified.  IMPRESSION: Single living IUP measuring 8 weeks 3 days with [place] EDC of 01/31/2015. This is concordant with LMP.  No significant maternal uterine or adnexal abnormality identified.   Electronically Signed   By: Earle Gell M.D.   On: 06/24/2014 16:18   US Transvaginal Non-ob  06/24/2014   CLINICAL DATA:  Abdominal pain and pregnancy. Gestational age by LMP of 8 weeks 2 days.  EXAM: OBSTETRIC <14 WK [place] AND TRANSVAGINAL OB US  TECHNIQUE: Both transabdominal and transvaginal ultrasound examinations were performed for complete evaluation of the gestation as well as the maternal uterus, adnexal regions, and pelvic cul-de-sac. Transvaginal technique was performed to assess early pregnancy.  COMPARISON:  None.  FINDINGS: Intrauterine gestational sac: Visualized/normal in shape.  Yolk sac:  Visualized  Embryo:  Visualized  Cardiac Activity: Visualized  Heart Rate:  158 bpm  CRL:   18  mm   8 w 3 d                  [place] EDC: 01/31/2015  Maternal uterus/adnexae: Both ovaries are normal in appearance. No adnexal mass or free fluid identified.  IMPRESSION: Single living IUP measuring 8 weeks 3 days with [place] EDC of 01/31/2015. This is concordant with LMP.  No significant maternal uterine or adnexal abnormality identified.   Electronically Signed   By: Earle Gell M.D.   On: 06/24/2014 16:18     Review of Systems  Constitutional: Negative for fever and chills.  Gastrointestinal: Positive for nausea. Negative for abdominal pain (Denies pain currently ), diarrhea and constipation.  Genitourinary: Negative for dysuria and urgency.       No vaginal discharge. No vaginal bleeding. No dysuria.    Physical Exam   Blood pressure 140/78, pulse 84, temperature 98.3 F (36.8 C), temperature source Oral, resp. rate 16, height 5\' 4"  (1.626 m), weight 68.221 kg (150 lb 6.4 oz), last menstrual period 04/27/2014, SpO2 100 %.  Physical Exam  Constitutional: She is oriented to person, place, and time. She appears  well-developed and well-nourished. No distress.  HENT:  Head: Normocephalic.  Eyes: Pupils are equal, round, and reactive to light.  Neck: Neck supple.  Respiratory: Effort normal.  GI: Normal appearance. There is generalized tenderness.  Genitourinary:  Speculum exam: Vagina - Small amount of creamy discharge, no odor Cervix - No contact bleeding Bimanual exam: Cervix closed Uterus non tender, enlarged  Adnexa non tender, no masses bilaterally GC/Chlam, wet prep done Chaperone present for exam.   Musculoskeletal: Normal range of motion.  Neurological: She is alert and oriented  to person, place, and time.  Skin: Skin is warm. She is not diaphoretic.  Psychiatric: Her behavior is normal.    MAU Course  Procedures  None  MDM CBC Hcg AbO [place]  Wet prep  GC  UA: positive nitrites  Assessment and Plan   A: 1. UTI (lower urinary tract infection)   2. Abdominal pain in pregnancy    P: Discharge home in stable condition  RX: Keflex Urine culture pending Return to MAU as needed, if symptoms worsen Start prenatal care ASAP First trimester warning signs discussed   Darrelyn Hillock Rasch, NP  06/25/2014 9:14 PM

## 2014-06-24 NOTE — ED Notes (Signed)
Reports she had 2 pos preg tests at home; just wants to make sure they weren't a mistake Also reports intermittent abd cramping LMP = 04/27/2014 Alert, no signs of acute distress.

## 2014-06-24 NOTE — ED Provider Notes (Signed)
CSN: [id number]     Arrival date & time 06/24/14  1133 History   First MD Initiated Contact with Patient 06/24/14 1257     Chief Complaint  Patient presents with  . Possible Pregnancy   (Consider location/radiation/quality/duration/timing/severity/associated sxs/prior Treatment) HPI Comments: 23 year old female presents to the urgent care to have a pregnancy test. Her home pregnancy test was positive. LMP 04/27/2014. One week ago she developed intermittent cramping across the lower abdomen. Today she has right lower quadrant tenderness. Also complaining of urinary frequency but no dysuria. This is her first pregnancy.  Patient is a 23 y.o. female presenting with pregnancy problem.  Possible Pregnancy Associated symptoms include abdominal pain. Pertinent negatives include no chest pain and no shortness of breath.    Past Medical History  Diagnosis Date  . Chronic abdominal pain    Past Surgical History  Procedure Laterality Date  . Breast fibroadenoma surgery     Family History  Problem Relation Age of Onset  . Hypertension Mother   . Hyperlipidemia Mother   . Diabetes Mother   . Cancer Maternal Aunt     Breast  . Diabetes Maternal Aunt   . Stroke Maternal Grandmother   . Cancer Paternal Grandfather    History  Substance Use Topics  . Smoking status: Former Smoker -- 0.20 packs/day    Types: Cigarettes    Quit date: 07/24/2013  . Smokeless tobacco: Former Systems developer  . Alcohol Use: No   OB History    No data available     Review of Systems  Constitutional: Negative for fever, activity change and fatigue.  Respiratory: Negative for cough and shortness of breath.   Cardiovascular: Negative for chest pain.  Gastrointestinal: Positive for abdominal pain. Negative for nausea, vomiting, diarrhea and rectal pain.  Genitourinary: Positive for frequency and menstrual problem. Negative for dysuria, flank pain and vaginal discharge.  Neurological: Negative for dizziness and  light-headedness.    Allergies  Peanut-containing drug products; Milk-related compounds; and Septra  Home Medications   Prior to Admission medications   Medication Sig Start Date End Date Taking? Authorizing Provider  bacitracin 500 UNIT/GM ointment Apply 1 application topically 2 (two) times daily. For 1 week or until healed. 05/04/14   [name], DO  dicyclomine (BENTYL) 20 MG tablet Take 1 tablet (20 mg total) by mouth 2 (two) times daily. 02/19/14   April K Palumbo-Rasch, MD  omeprazole (PRILOSEC) 20 MG capsule Take 1 capsule (20 mg total) by mouth daily. 02/19/14   April K Palumbo-Rasch, MD  valACYclovir (VALTREX) 1000 MG tablet Take 1 tablet (1,000 mg total) by mouth 2 (two) times daily. 05/12/14   [name], DO   BP 119/66 mmHg  Pulse 82  Temp(Src) 97.4 F (36.3 C) (Oral)  Resp 20  SpO2 98%  LMP 04/27/2014 Physical Exam  Constitutional: She is oriented to person, place, and time. She appears well-developed and well-nourished. No distress.  Neck: Normal range of motion. Neck supple.  Cardiovascular: Normal rate.   Pulmonary/Chest: Effort normal and breath sounds normal.  Abdominal: Soft. There is no rebound and no guarding.  BS hypoactive. Mildly distended. Percusses dull most quadrants. Tenderness primarily RLQ/right pelvis.  Neurological: She is alert and oriented to person, place, and time. She exhibits normal muscle tone.  Skin: Skin is warm and dry.  Psychiatric: She has a normal mood and affect.  Nursing note and vitals reviewed.   ED Course  Procedures (including critical care time) Labs Review Labs Reviewed  POCT PREGNANCY,  URINE - Abnormal; Notable for the following:    Preg Test, Ur POSITIVE (*)    All other components within normal limits  POCT URINALYSIS DIP (DEVICE) - Abnormal; Notable for the following:    Hgb urine dipstick MODERATE (*)    Nitrite POSITIVE (*)    All other components within normal limits    Imaging  Review No results found.   MDM   1. Positive pregnancy test   2. Lower abdominal pain   3. Abdominal tenderness, RLQ (right lower quadrant)   4. Tenderness of female pelvic organs   5. Constipation, unspecified constipation type   6. Urinary frequency     Positive pregnancy test with lower abdominal pain and RLQ abdominopelvic tenderness. Palpation and percussion suggests constipation. Urinary frequency with urine nitrites and microhematuria.  Send to [facility] now by POV. Stable . Has a friend to accompany.    Janne Napoleon, NP 06/24/14 1323

## 2014-06-24 NOTE — Discharge Instructions (Signed)
Abdominal Pain During Pregnancy Abdominal pain is common in pregnancy. Most of the time, it does not cause harm. There are many causes of abdominal pain. Some causes are more serious than others. Some of the causes of abdominal pain in pregnancy are easily diagnosed. Occasionally, the diagnosis takes time to understand. Other times, the cause is not determined. Abdominal pain can be a sign that something is very wrong with the pregnancy, or the pain may have nothing to do with the pregnancy at all. For this reason, always tell your health care provider if you have any abdominal discomfort. HOME CARE INSTRUCTIONS  Monitor your abdominal pain for any changes. The following actions may help to alleviate any discomfort you are experiencing:  Do not have sexual intercourse or put anything in your vagina until your symptoms go away completely.  Get plenty of rest until your pain improves.  Drink clear fluids if you feel nauseous. Avoid solid food as long as you are uncomfortable or nauseous.  Only take over-the-counter or prescription medicine as directed by your health care provider.  Keep all follow-up appointments with your health care provider. SEEK IMMEDIATE MEDICAL CARE IF:  You are bleeding, leaking fluid, or passing tissue from the vagina.  You have increasing pain or cramping.  You have persistent vomiting.  You have painful or bloody urination.  You have a fever.  You notice a decrease in your baby's movements.  You have extreme weakness or feel faint.  You have shortness of breath, with or without abdominal pain.  You develop a severe headache with abdominal pain.  You have abnormal vaginal discharge with abdominal pain.  You have persistent diarrhea.  You have abdominal pain that continues even after rest, or gets worse. MAKE SURE YOU:   Understand these instructions.  Will watch your condition.  Will get help right away if you are not doing well or get  worse. Document Released: 07/10/2005 Document Revised: 04/30/2013 Document Reviewed: 02/06/2013 [facility] Patient Information 2015 [place], [place]. This information is not intended to replace advice given to you by your health care provider. Make sure you discuss any questions you have with your health care provider.  Constipation Constipation is when a person has fewer than three bowel movements a week, has difficulty having a bowel movement, or has stools that are dry, hard, or larger than normal. As people grow older, constipation is more common. If you try to fix constipation with medicines that make you have a bowel movement (laxatives), the problem may get worse. Long-term laxative use may cause the muscles of the colon to become weak. A low-fiber diet, not taking in enough fluids, and taking certain medicines may make constipation worse.  CAUSES   Certain medicines, such as antidepressants, pain medicine, iron supplements, antacids, and water pills.   Certain diseases, such as diabetes, irritable bowel syndrome (IBS), thyroid disease, or depression.   Not drinking enough water.   Not eating enough fiber-rich foods.   Stress or travel.   Lack of physical activity or exercise.   Ignoring the urge to have a bowel movement.   Using laxatives too much.  SIGNS AND SYMPTOMS   Having fewer than three bowel movements a week.   Straining to have a bowel movement.   Having stools that are hard, dry, or larger than normal.   Feeling full or bloated.   Pain in the lower abdomen.   Not feeling relief after having a bowel movement.  DIAGNOSIS  Your health care provider  will take a medical history and perform a physical exam. Further testing may be done for severe constipation. Some tests may include:  A barium enema X-ray to examine your rectum, colon, and, sometimes, your small intestine.   A sigmoidoscopy to examine your lower colon.   A colonoscopy to examine your  entire colon. TREATMENT  Treatment will depend on the severity of your constipation and what is causing it. Some dietary treatments include drinking more fluids and eating more fiber-rich foods. Lifestyle treatments may include regular exercise. If these diet and lifestyle recommendations do not help, your health care provider may recommend taking over-the-counter laxative medicines to help you have bowel movements. Prescription medicines may be prescribed if over-the-counter medicines do not work.  HOME CARE INSTRUCTIONS   Eat foods that have a lot of fiber, such as fruits, vegetables, whole grains, and beans.  Limit foods high in fat and processed sugars, such as french fries, hamburgers, cookies, candies, and soda.   A fiber supplement may be added to your diet if you cannot get enough fiber from foods.   Drink enough fluids to keep your urine clear or pale yellow.   Exercise regularly or as directed by your health care provider.   Go to the restroom when you have the urge to go. Do not hold it.   Only take over-the-counter or prescription medicines as directed by your health care provider. Do not take other medicines for constipation without talking to your health care provider first.  [place] IF:   You have bright red blood in your stool.   Your constipation lasts for more than 4 days or gets worse.   You have abdominal or rectal pain.   You have thin, pencil-like stools.   You have unexplained weight loss. MAKE SURE YOU:   Understand these instructions.  Will watch your condition.  Will get help right away if you are not doing well or get worse. Document Released: 04/07/2004 Document Revised: 07/15/2013 Document Reviewed: 04/21/2013 [facility] Patient Information 2015 [place], [place]. This information is not intended to replace advice given to you by your health care provider. Make sure you discuss any questions you have with your health care  provider.  Pelvic Pain Female pelvic pain can be caused by many different things and start from a variety of places. Pelvic pain refers to pain that is located in the lower half of the abdomen and between your hips. The pain may occur over a short period of time (acute) or may be reoccurring (chronic). The cause of pelvic pain may be related to disorders affecting the female reproductive organs (gynecologic), but it may also be related to the bladder, kidney stones, an intestinal complication, or muscle or skeletal problems. Getting help right away for pelvic pain is important, especially if there has been severe, sharp, or a sudden onset of unusual pain. It is also important to get help right away because some types of pelvic pain can be life threatening.  CAUSES  Below are only some of the causes of pelvic pain. The causes of pelvic pain can be in one of several categories.   Gynecologic.  Pelvic inflammatory disease.  Sexually transmitted infection.  Ovarian cyst or a twisted ovarian ligament (ovarian torsion).  Uterine lining that grows outside the uterus (endometriosis).  Fibroids, cysts, or tumors.  Ovulation.  Pregnancy.  Pregnancy that occurs outside the uterus (ectopic pregnancy).  Miscarriage.  Labor.  Abruption of the placenta or ruptured uterus.  Infection.  Uterine infection (endometritis).  Bladder infection.  Diverticulitis.  Miscarriage related to a uterine infection (septic abortion).  Bladder.  Inflammation of the bladder (cystitis).  Kidney stone(s).  Gastrointestinal.  Constipation.  Diverticulitis.  Neurologic.  Trauma.  Feeling pelvic pain because of mental or emotional causes (psychosomatic).  Cancers of the bowel or pelvis. EVALUATION  Your caregiver will want to take a careful history of your concerns. This includes recent changes in your health, a careful gynecologic history of your periods (menses), and a sexual history.  Obtaining your family history and medical history is also important. Your caregiver may suggest a pelvic exam. A pelvic exam will help identify the location and severity of the pain. It also helps in the evaluation of which organ system may be involved. In order to identify the cause of the pelvic pain and be properly treated, your caregiver may order tests. These tests may include:   A pregnancy test.  Pelvic ultrasonography.  An X-ray exam of the abdomen.  A urinalysis or evaluation of vaginal discharge.  Blood tests. HOME CARE INSTRUCTIONS   Only take over-the-counter or prescription medicines for pain, discomfort, or fever as directed by your caregiver.   Rest as directed by your caregiver.   Eat a balanced diet.   Drink enough fluids to make your urine clear or pale yellow, or as directed.   Avoid sexual intercourse if it causes pain.   Apply warm or cold compresses to the lower abdomen depending on which one helps the pain.   Avoid stressful situations.   Keep a journal of your pelvic pain. Write down when it started, where the pain is located, and if there are things that seem to be associated with the pain, such as food or your menstrual cycle.  Follow up with your caregiver as directed.  SEEK MEDICAL CARE IF:  Your medicine does not help your pain.  You have abnormal vaginal discharge. SEEK IMMEDIATE MEDICAL CARE IF:   You have heavy bleeding from the vagina.   Your pelvic pain increases.   You feel light-headed or faint.   You have chills.   You have pain with urination or blood in your urine.   You have uncontrolled diarrhea or vomiting.   You have a fever or persistent symptoms for more than 3 days.  You have a fever and your symptoms suddenly get worse.   You are being physically or sexually abused.  MAKE SURE YOU:  Understand these instructions.  Will watch your condition.  Will get help if you are not doing well or get  worse. Document Released: 06/06/2004 Document Revised: 11/24/2013 Document Reviewed: 10/30/2011 [facility] Patient Information 2015 [place], [place]. This information is not intended to replace advice given to you by your health care provider. Make sure you discuss any questions you have with your health care provider.

## 2014-06-24 NOTE — MAU Note (Signed)
Patient states she has been having mid abdominal pani for one week. Had a positive home pregnancy test and went to [facility]. Was sent to MAU for further evaluation. Has a milky vaginal discharge, no vomiting. Has fatigue, nausea and tender breasts.

## 2014-06-25 LAB — CULTURE, OB URINE
CULTURE: NO GROWTH
Colony Count: NO GROWTH
Special Requests: NORMAL

## 2014-06-25 LAB — GC/CHLAMYDIA PROBE AMP
CT Probe RNA: NEGATIVE
GC Probe RNA: NEGATIVE

## 2014-06-29 ENCOUNTER — Encounter (HOSPITAL_COMMUNITY): Payer: Self-pay | Admitting: *Deleted

## 2014-06-29 ENCOUNTER — Inpatient Hospital Stay (HOSPITAL_COMMUNITY)
Admission: AD | Admit: 2014-06-29 | Discharge: 2014-06-29 | Disposition: A | Discharge status: Home / Self Care | Payer: Medicaid Other | Source: Ambulatory Visit | Attending: Family Medicine | Admitting: Family Medicine

## 2014-06-29 DIAGNOSIS — N390 Urinary tract infection, site not specified: Secondary | ICD-10-CM

## 2014-06-29 DIAGNOSIS — Z3A08 8 weeks gestation of pregnancy: Secondary | ICD-10-CM | POA: Diagnosis not present

## 2014-06-29 DIAGNOSIS — O209 Hemorrhage in early pregnancy, unspecified: Secondary | ICD-10-CM | POA: Diagnosis present

## 2014-06-29 DIAGNOSIS — O2341 Unspecified infection of urinary tract in pregnancy, first trimester: Secondary | ICD-10-CM | POA: Insufficient documentation

## 2014-06-29 DIAGNOSIS — O4691 Antepartum hemorrhage, unspecified, first trimester: Secondary | ICD-10-CM

## 2014-06-29 HISTORY — DX: Major depressive disorder, single episode, unspecified: F32.9

## 2014-06-29 HISTORY — DX: Depression, unspecified: F32.A

## 2014-06-29 HISTORY — DX: Headache, unspecified: R51.9

## 2014-06-29 HISTORY — DX: Unspecified infectious disease: B99.9

## 2014-06-29 HISTORY — DX: Unspecified abnormal cytological findings in specimens from vagina: R87.629

## 2014-06-29 HISTORY — DX: Headache: R51

## 2014-06-29 LAB — URINALYSIS, ROUTINE W REFLEX MICROSCOPIC
Bilirubin Urine: NEGATIVE
GLUCOSE, UA: NEGATIVE mg/dL
Ketones, ur: NEGATIVE mg/dL
Leukocytes, UA: NEGATIVE
Nitrite: POSITIVE — AB
Protein, ur: NEGATIVE mg/dL
SPECIFIC GRAVITY, URINE: 1.015 (ref 1.005–1.030)
Urobilinogen, UA: 0.2 mg/dL (ref 0.0–1.0)
pH: 7 (ref 5.0–8.0)

## 2014-06-29 LAB — URINE MICROSCOPIC-ADD ON

## 2014-06-29 LAB — WET PREP, GENITAL
TRICH WET PREP: NONE SEEN
YEAST WET PREP: NONE SEEN

## 2014-06-29 NOTE — MAU Note (Signed)
Noted blood when used the restroom this morning.  Pinkish when wiped.

## 2014-06-29 NOTE — MAU Provider Note (Signed)
History     CSN: [id number]  Arrival date and time: 06/29/14 0740   First Provider Initiated Contact with Patient 06/29/14 [phone]      Chief Complaint  Patient presents with  . Vaginal Bleeding   HPI   [name] is a 23 y.o. female who presents with acute onset of vaginal bleeding this morning around 7:00 am. She noticed it when she wiped after using the bathroom. I saw her in MAU on 12/2; she presented with abdominal pain. US showed IUP with cardiac activity. She was diagnosed with a UTI and prescribed keflex; the patient has failed to adhere to the plan of care and has not picked up the RX for keflex. The bleeding is described as a scant amount. She is having some mild, lower abdominal cramping.  + history of genital herpes. Denies recent intercourse.   OB History    Gravida Para Term Preterm AB TAB SAB Ectopic Multiple Living   1               Past Medical History  Diagnosis Date  . Chronic abdominal pain   . Headache   . Infection     UTI  . Depression     hx of, no current problems  . Vaginal Pap smear, abnormal     bx, f/u ok since    Past Surgical History  Procedure Laterality Date  . Breast fibroadenoma surgery      Family History  Problem Relation Age of Onset  . Hypertension Mother   . Hyperlipidemia Mother   . Diabetes Mother   . Cancer Maternal Aunt     Breast  . Diabetes Maternal Aunt   . Cancer Maternal Grandfather     lung    History  Substance Use Topics  . Smoking status: Former Smoker -- 0.20 packs/day    Types: Cigarettes    Quit date: 05/24/2014  . Smokeless tobacco: Former Systems developer     Comment: also smoked hookah  . Alcohol Use: No    Allergies:  Allergies  Allergen Reactions  . Mushroom Extract Complex Anaphylaxis    Facial swelling  . Peanut-Containing Drug Products Anaphylaxis  . Milk-Related Compounds Nausea And Vomiting  . Septra [Sulfamethoxazole-Trimethoprim] Hives    Prescriptions prior to admission   Medication Sig Dispense Refill Last Dose  . aspirin-acetaminophen-caffeine (EXCEDRIN MIGRAINE) 250-250-65 MG per tablet Take 1 tablet by mouth every 6 (six) hours as needed for headache.   Past Week at Unknown time  . Prenatal Vit-Fe Fumarate-FA (PRENATAL MULTIVITAMIN) TABS tablet Take 1 tablet by mouth daily at 12 noon.   06/29/2014 at Unknown time  . cephALEXin (KEFLEX) 500 MG capsule Take 1 capsule (500 mg total) by mouth 4 (four) times daily. (Patient not taking: Reported on 06/29/2014) 20 capsule 0   . omeprazole (PRILOSEC) 20 MG capsule Take 1 capsule (20 mg total) by mouth daily. (Patient not taking: Reported on 06/24/2014) 30 capsule 0 Unknown at Unknown time  . valACYclovir (VALTREX) 1000 MG tablet Take 1 tablet (1,000 mg total) by mouth 2 (two) times daily. 20 tablet 0 not picked up    Results for orders placed or performed during the hospital encounter of 06/29/14 (from the past 48 hour(s))  Urinalysis, Routine w reflex microscopic     Status: Abnormal   Collection Time: 06/29/14  8:08 AM  Result Value Ref Range   Color, Urine YELLOW YELLOW   APPearance CLEAR CLEAR   Specific Gravity, Urine 1.015 1.005 -  1.030   pH 7.0 5.0 - 8.0   Glucose, UA NEGATIVE NEGATIVE mg/dL   Hgb urine dipstick MODERATE (A) NEGATIVE   Bilirubin Urine NEGATIVE NEGATIVE   Ketones, ur NEGATIVE NEGATIVE mg/dL   Protein, ur NEGATIVE NEGATIVE mg/dL   Urobilinogen, UA 0.2 0.0 - 1.0 mg/dL   Nitrite POSITIVE (A) NEGATIVE   Leukocytes, UA NEGATIVE NEGATIVE  Urine microscopic-add on     Status: Abnormal   Collection Time: 06/29/14  8:08 AM  Result Value Ref Range   Squamous Epithelial / LPF RARE RARE   WBC, UA 3-6 <3 WBC/hpf   RBC / HPF 3-6 <3 RBC/hpf   Bacteria, UA FEW (A) RARE  Wet prep, genital     Status: Abnormal   Collection Time: 06/29/14  8:45 AM  Result Value Ref Range   Yeast Wet Prep HPF POC NONE SEEN NONE SEEN   Trich, Wet Prep NONE SEEN NONE SEEN   Clue Cells Wet Prep HPF POC FEW (A) NONE SEEN    WBC, Wet Prep HPF POC FEW (A) NONE SEEN    Comment: FEW BACTERIA SEEN    No results found.  Review of Systems  Gastrointestinal: Positive for abdominal pain (+Lower abdomianl cramping.). Negative for nausea and vomiting.  Genitourinary: Positive for urgency and frequency. Negative for dysuria.   Physical Exam   Blood pressure 124/67, pulse 82, temperature 98.2 F (36.8 C), temperature source Oral, resp. rate 18, height 5' 2.5" (1.588 m), weight 67.586 kg (149 lb), last menstrual period 04/27/2014.  Physical Exam  Constitutional: She is oriented to person, place, and time. She appears well-developed and well-nourished. No distress.  HENT:  Head: Normocephalic.  Eyes: Pupils are equal, round, and reactive to light.  Neck: Neck supple.  Genitourinary:  Speculum exam: Vagina - Small amount of creamy, pink discharge, no odor Cervix - No contact bleeding, no active bleeding.  Wet prep done Chaperone present for exam.   Musculoskeletal: Normal range of motion.  Neurological: She is alert and oriented to person, place, and time.  Skin: Skin is warm. She is not diaphoretic.  Psychiatric: Her behavior is normal.    MAU Course  Procedures  None  MDM UA Wet prep Dr. Nehemiah Settle called to bedside for bedside US to evaluate for cardiac activity.  Bedside US done by Dr. Nehemiah Settle; fetal heart rate observed at the rate of approximately 140 bpm. No subchorionic hemorrhage noted.   Assessment and Plan   A: 1. UTI (lower urinary tract infection)   2. Vaginal bleeding in pregnancy, first trimester    P: Discharge home in stable condition Return to MAU as needed, if symptoms worsen Take keflex as prescribed First trimester warning signs discussed Start prenatal care ASAP Bleeding precautions   Kathryn Fox, [place] 06/29/2014, 8:34 AM

## 2014-07-01 ENCOUNTER — Encounter: Payer: Self-pay | Admitting: Family Medicine

## 2014-07-01 ENCOUNTER — Ambulatory Visit (INDEPENDENT_AMBULATORY_CARE_PROVIDER_SITE_OTHER): Payer: No Typology Code available for payment source | Admitting: Family Medicine

## 2014-07-01 VITALS — BP 122/64 | HR 96 | Temp 98.1°F | Ht 64.0 in | Wt 149.0 lb

## 2014-07-01 DIAGNOSIS — N898 Other specified noninflammatory disorders of vagina: Secondary | ICD-10-CM

## 2014-07-01 DIAGNOSIS — N3001 Acute cystitis with hematuria: Secondary | ICD-10-CM

## 2014-07-01 DIAGNOSIS — Z3401 Encounter for supervision of normal first pregnancy, first trimester: Secondary | ICD-10-CM

## 2014-07-01 NOTE — Progress Notes (Signed)
   Subjective:    Kathryn Fox is a G1P0 [redacted]w[redacted]d being seen today for her first obstetrical visit.  Her obstetrical history is significant for nothing. Patient does intend to breast feed. Patient is undecided on contraception. Pregnancy history fully reviewed. Smoking quit date: 06/13/2014.   Patient reports breast soreness, back pain, fatigue.  Filed Vitals:   07/01/14 1015  BP: 122/64  Pulse: 96  Temp: 98.1 F (36.7 C)  TempSrc: Oral  Height: 5\' 4"  (1.626 m)  Weight: 149 lb (67.586 kg)    HISTORY: OB History  Gravida Para Term Preterm AB SAB TAB Ectopic Multiple Living  1             # Outcome Date GA Lbr Len/2nd Weight Sex Delivery Anes PTL Lv  1 Current              Past Medical History  Diagnosis Date  . Chronic abdominal pain   . Headache   . Infection     UTI  . Depression     hx of, no current problems  . Vaginal Pap smear, abnormal     bx, f/u ok since   Past Surgical History  Procedure Laterality Date  . Breast fibroadenoma surgery     Family History  Problem Relation Age of Onset  . Hypertension Mother   . Hyperlipidemia Mother   . Diabetes Mother   . Cancer Maternal Aunt     Breast  . Diabetes Maternal Aunt   . Cancer Maternal Grandfather     lung     Exam     Skin: normal coloration and turgor, no rashes    Neurologic: oriented, normal   Extremities: normal strength, tone, and muscle mass   HEENT PERRLA   Mouth/Teeth mucous membranes moist, pharynx normal without lesions   Neck supple   Cardiovascular: regular rate and rhythm   Respiratory:  appears well, vitals normal, no respiratory distress, acyanotic, normal RR, ear and throat exam is normal, neck free of mass or lymphadenopathy, chest clear, no wheezing, crepitations, rhonchi, normal symmetric air entry   Abdomen: soft, non-tender; bowel sounds normal; no masses,  no organomegaly      Assessment:    Pregnancy: G1P0 Patient Active Problem List   Diagnosis Date Noted  .  Screening for STD (sexually transmitted disease) 05/13/2014  . 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 Desire for pregnancy 10/18/2013  . Healthcare maintenance 08/29/2013  . Depression 05/29/2013  . Neck pain, chronic 02/25/2011  . Migraine without aura 09/22/2010        Plan:     Initial labs not drawn. Prenatal vitamins. Problem list reviewed and updated.  Follow up in 4 weeks.   Cordelia Poche 07/01/2014

## 2014-07-01 NOTE — Patient Instructions (Signed)
First Trimester of Pregnancy The first trimester of pregnancy is from week 1 until the end of week 12 (months 1 through 3). A week after a sperm fertilizes an egg, the egg will implant on the wall of the uterus. This embryo will begin to develop into a baby. Genes from you and your partner are forming the baby. The female genes determine whether the baby is a boy or a girl. At 6-8 weeks, the eyes and face are formed, and the heartbeat can be seen on ultrasound. At the end of 12 weeks, all the baby's organs are formed.  Now that you are pregnant, you will want to do everything you can to have a healthy baby. Two of the most important things are to get good prenatal care and to follow your health care provider's instructions. Prenatal care is all the medical care you receive before the baby's birth. This care will help prevent, find, and treat any problems during the pregnancy and childbirth. BODY CHANGES Your body goes through many changes during pregnancy. The changes vary from woman to woman.   You may gain or lose a couple of pounds at first.  You may feel sick to your stomach (nauseous) and throw up (vomit). If the vomiting is uncontrollable, call your health care provider.  You may tire easily.  You may develop headaches that can be relieved by medicines approved by your health care provider.  You may urinate more often. Painful urination may mean you have a bladder infection.  You may develop heartburn as a result of your pregnancy.  You may develop constipation because certain hormones are causing the muscles that push waste through your intestines to slow down.  You may develop hemorrhoids or swollen, bulging veins (varicose veins).  Your breasts may begin to grow larger and become tender. Your nipples may stick out more, and the tissue that surrounds them (areola) may become darker.  Your gums may bleed and may be sensitive to brushing and flossing.  Dark spots or blotches (chloasma,  mask of pregnancy) may develop on your face. This will likely fade after the baby is born.  Your menstrual periods will stop.  You may have a loss of appetite.  You may develop cravings for certain kinds of food.  You may have changes in your emotions from day to day, such as being excited to be pregnant or being concerned that something may go wrong with the pregnancy and baby.  You may have more vivid and strange dreams.  You may have changes in your hair. These can include thickening of your hair, rapid growth, and changes in texture. Some women also have hair loss during or after pregnancy, or hair that feels dry or thin. Your hair will most likely return to normal after your baby is born. WHAT TO EXPECT AT YOUR PRENATAL VISITS During a routine prenatal visit:  You will be weighed to make sure you and the baby are growing normally.  Your blood pressure will be taken.  Your abdomen will be measured to track your baby's growth.  The fetal heartbeat will be listened to starting around week 10 or 12 of your pregnancy.  Test results from any previous visits will be discussed. Your health care provider may ask you:  How you are feeling.  If you are feeling the baby move.  If you have had any abnormal symptoms, such as leaking fluid, bleeding, severe headaches, or abdominal cramping.  If you have any questions. Other tests   that may be performed during your first trimester include:  Blood tests to find your blood type and to check for the presence of any previous infections. They will also be used to check for low iron levels (anemia) and Rh antibodies. Later in the pregnancy, blood tests for diabetes will be done along with other tests if problems develop.  Urine tests to check for infections, diabetes, or protein in the urine.  An ultrasound to confirm the proper growth and development of the baby.  An amniocentesis to check for possible genetic problems.  Fetal screens for  spina bifida and Down syndrome.  You may need other tests to make sure you and the baby are doing well. HOME CARE INSTRUCTIONS  Medicines  Follow your health care provider's instructions regarding medicine use. Specific medicines may be either safe or unsafe to take during pregnancy.  Take your prenatal vitamins as directed.  If you develop constipation, try taking a stool softener if your health care provider approves. Diet  Eat regular, well-balanced meals. Choose a variety of foods, such as meat or vegetable-based protein, fish, milk and low-fat dairy products, vegetables, fruits, and whole grain breads and cereals. Your health care provider will help you determine the amount of weight gain that is right for you.  Avoid raw meat and uncooked cheese. These carry germs that can cause birth defects in the baby.  Eating four or five small meals rather than three large meals a day may help relieve nausea and vomiting. If you start to feel nauseous, eating a few soda crackers can be helpful. Drinking liquids between meals instead of during meals also seems to help nausea and vomiting.  If you develop constipation, eat more high-fiber foods, such as fresh vegetables or fruit and whole grains. Drink enough fluids to keep your urine clear or pale yellow. Activity and Exercise  Exercise only as directed by your health care provider. Exercising will help you:  Control your weight.  Stay in shape.  Be prepared for labor and delivery.  Experiencing pain or cramping in the lower abdomen or low back is a good sign that you should stop exercising. Check with your health care provider before continuing normal exercises.  Try to avoid standing for long periods of time. Move your legs often if you must stand in one place for a long time.  Avoid heavy lifting.  Wear low-heeled shoes, and practice good posture.  You may continue to have sex unless your health care provider directs you  otherwise. Relief of Pain or Discomfort  Wear a good support bra for breast tenderness.   Take warm sitz baths to soothe any pain or discomfort caused by hemorrhoids. Use hemorrhoid cream if your health care provider approves.   Rest with your legs elevated if you have leg cramps or low back pain.  If you develop varicose veins in your legs, wear support hose. Elevate your feet for 15 minutes, 3-4 times a day. Limit salt in your diet. Prenatal Care  Schedule your prenatal visits by the twelfth week of pregnancy. They are usually scheduled monthly at first, then more often in the last 2 months before delivery.  Write down your questions. Take them to your prenatal visits.  Keep all your prenatal visits as directed by your health care provider. Safety  Wear your seat belt at all times when driving.  Make a list of emergency phone numbers, including numbers for family, friends, the hospital, and police and fire departments. General Tips    Ask your health care provider for a referral to a local prenatal education class. Begin classes no later than at the beginning of month 6 of your pregnancy.  Ask for help if you have counseling or nutritional needs during pregnancy. Your health care provider can offer advice or refer you to specialists for help with various needs.  Do not use hot tubs, steam rooms, or saunas.  Do not douche or use tampons or scented sanitary pads.  Do not cross your legs for long periods of time.  Avoid cat litter boxes and soil used by cats. These carry germs that can cause birth defects in the baby and possibly loss of the fetus by miscarriage or stillbirth.  Avoid all smoking, herbs, alcohol, and medicines not prescribed by your health care provider. Chemicals in these affect the formation and growth of the baby.  Schedule a dentist appointment. At home, brush your teeth with a soft toothbrush and be gentle when you floss. SEEK MEDICAL CARE IF:   You have  dizziness.  You have mild pelvic cramps, pelvic pressure, or nagging pain in the abdominal area.  You have persistent nausea, vomiting, or diarrhea.  You have a bad smelling vaginal discharge.  You have pain with urination.  You notice increased swelling in your face, hands, legs, or ankles. SEEK IMMEDIATE MEDICAL CARE IF:   You have a fever.  You are leaking fluid from your vagina.  You have spotting or bleeding from your vagina.  You have severe abdominal cramping or pain.  You have rapid weight gain or loss.  You vomit blood or material that looks like coffee grounds.  You are exposed to German measles and have never had them.  You are exposed to fifth disease or chickenpox.  You develop a severe headache.  You have shortness of breath.  You have any kind of trauma, such as from a fall or a car accident. Document Released: 07/04/2001 Document Revised: 11/24/2013 Document Reviewed: 05/20/2013 Exit[employer] Patient Information 2015 [employer]. This information is not intended to replace advice given to you by your health care provider. Make sure you discuss any questions you have with your health care provider.  

## 2014-07-07 DIAGNOSIS — N3001 Acute cystitis with hematuria: Secondary | ICD-10-CM | POA: Insufficient documentation

## 2014-07-07 DIAGNOSIS — Z3401 Encounter for supervision of normal first pregnancy, first trimester: Secondary | ICD-10-CM | POA: Insufficient documentation

## 2014-07-07 DIAGNOSIS — N898 Other specified noninflammatory disorders of vagina: Secondary | ICD-10-CM | POA: Insufficient documentation

## 2014-07-07 NOTE — Assessment & Plan Note (Addendum)
Patient unable to get labwork today as she is working on getting her insurance. Will follow-up after patient gets insurance to obtain initial OB labs. Patient with family history of diabetes and is african [place]. Will obtain 1hr glucola test at next visit.

## 2014-07-07 NOTE — Assessment & Plan Note (Signed)
Patient finished course of Keflex. No urinary symptoms. Will obtain repeat urinalysis and urine culture to assess resolution.

## 2014-07-24 NOTE — L&D Delivery Note (Signed)
Delivery Note Rapid progression from 4 to complete, baby tachycardic with min variability, category 2-3. Maternal fever to 103, had received ampicillin, gentamicin and clindamycin. After apx 30 min of pushing, at 1:38 AM a viable female was delivered via Vaginal, Spontaneous Delivery (Presentation: Right Occiput Posterior).  APGAR: 1, 6; weight P .   Placenta status: Intact, Spontaneous Pathology.  Cord: 3 vessels with the following complications: None.  Cord pH: 6.95.  Delayed cord clamping.   NICU present for delivery.  Anesthesia:  epidural Episiotomy: None Lacerations: None Suture Repair: N/A Est. Blood Loss (mL):  153cc  Mom to postpartum.  Baby to Couplet care / Skin to Skin.  Bovard-Stuckert, Kathryn Fox 01/17/2015, 1:55 AM   A+/RI/Tdap in PNC/Br/Contra Depo

## 2014-08-18 ENCOUNTER — Inpatient Hospital Stay (HOSPITAL_COMMUNITY)
Admission: AD | Admit: 2014-08-18 | Discharge: 2014-08-18 | Disposition: A | Discharge status: Home / Self Care | Payer: Medicaid Other | Source: Ambulatory Visit | Attending: Obstetrics and Gynecology | Admitting: Obstetrics and Gynecology

## 2014-08-18 ENCOUNTER — Encounter (HOSPITAL_COMMUNITY): Payer: Self-pay | Admitting: *Deleted

## 2014-08-18 DIAGNOSIS — Z87891 Personal history of nicotine dependence: Secondary | ICD-10-CM | POA: Diagnosis not present

## 2014-08-18 DIAGNOSIS — J069 Acute upper respiratory infection, unspecified: Secondary | ICD-10-CM

## 2014-08-18 DIAGNOSIS — R312 Other microscopic hematuria: Secondary | ICD-10-CM | POA: Insufficient documentation

## 2014-08-18 DIAGNOSIS — R3129 Other microscopic hematuria: Secondary | ICD-10-CM

## 2014-08-18 DIAGNOSIS — Z3A16 16 weeks gestation of pregnancy: Secondary | ICD-10-CM | POA: Insufficient documentation

## 2014-08-18 DIAGNOSIS — R0981 Nasal congestion: Secondary | ICD-10-CM | POA: Diagnosis present

## 2014-08-18 DIAGNOSIS — O9989 Other specified diseases and conditions complicating pregnancy, childbirth and the puerperium: Secondary | ICD-10-CM | POA: Insufficient documentation

## 2014-08-18 LAB — URINALYSIS, ROUTINE W REFLEX MICROSCOPIC
BILIRUBIN URINE: NEGATIVE
GLUCOSE, UA: NEGATIVE mg/dL
Ketones, ur: 15 mg/dL — AB
LEUKOCYTES UA: NEGATIVE
NITRITE: NEGATIVE
Protein, ur: NEGATIVE mg/dL
Specific Gravity, Urine: 1.02 (ref 1.005–1.030)
Urobilinogen, UA: 1 mg/dL (ref 0.0–1.0)
pH: 6.5 (ref 5.0–8.0)

## 2014-08-18 LAB — URINE MICROSCOPIC-ADD ON

## 2014-08-18 MED ORDER — GUAIFENESIN ER 600 MG PO TB12: 600.0000 mg | ORAL_TABLET | Freq: Two times a day (BID) | ORAL | Status: DC

## 2014-08-18 NOTE — MAU Provider Note (Signed)
Chief Complaint: Nasal Congestion   First Provider Initiated Contact with Patient 08/18/14 1701     SUBJECTIVE HPI: Kathryn Fox is a 24 y.o. G1P0 at [redacted]w[redacted]d by LMP who presents with 5 d hx or UR congestion, sneezing, coughing up clear phlegm and intermittent H/A. Concerned that rest, Robitussin, Tylenol and Benadryl ineffective. Boyfriend smokes cigars. No H/A now. Denies SOB, CP, wheezing, N/V. Denies dysuria, frequency, hematuria.   Past Medical History  Diagnosis Date  . Chronic abdominal pain   . Headache   . Infection     UTI  . Depression     hx of, no current problems  . Vaginal Pap smear, abnormal     bx, f/u ok since   OB History  Gravida Para Term Preterm AB SAB TAB Ectopic Multiple Living  1             # Outcome Date GA Lbr Len/2nd Weight Sex Delivery Anes PTL Lv  1 Current              Past Surgical History  Procedure Laterality Date  . Breast fibroadenoma surgery     History   Social History  . Marital Status: Single    Spouse Name: N/A    Number of Children: N/A  . Years of Education: N/A   Occupational History  . Not on file.   Social History Main Topics  . Smoking status: Former Smoker -- 0.20 packs/day    Types: Cigarettes    Quit date: 05/24/2014  . Smokeless tobacco: Former Systems developer     Comment: also smoked hookah  . Alcohol Use: No  . Drug Use: No  . Sexual Activity: Yes    Birth Control/ Protection: None   Other Topics Concern  . Not on file   Social History Narrative   No current facility-administered medications on file prior to encounter.   Current Outpatient Prescriptions on File Prior to Encounter  Medication Sig Dispense Refill  . Prenatal Vit-Fe Fumarate-FA (PRENATAL MULTIVITAMIN) TABS tablet Take 1 tablet by mouth daily at 12 noon.    . cephALEXin (KEFLEX) 500 MG capsule Take 1 capsule (500 mg total) by mouth 4 (four) times daily. (Patient not taking: Reported on 06/29/2014) 20 capsule 0  . omeprazole (PRILOSEC) 20 MG capsule  Take 1 capsule (20 mg total) by mouth daily. (Patient not taking: Reported on 06/24/2014) 30 capsule 0  . valACYclovir (VALTREX) 1000 MG tablet Take 1 tablet (1,000 mg total) by mouth 2 (two) times daily. (Patient taking differently: Take 1,000 mg by mouth 2 (two) times daily as needed (For outbreak.). ) 20 tablet 0   Allergies  Allergen Reactions  . Mushroom Extract Complex Anaphylaxis    Facial swelling  . Peanut-Containing Drug Products Anaphylaxis  . Milk-Related Compounds Nausea And Vomiting  . Septra [Sulfamethoxazole-Trimethoprim] Hives    ROS: Pertinent items in HPI  OBJECTIVE Blood pressure 132/59, pulse 77, temperature 98.7 F (37.1 C), temperature source Oral, resp. rate 16, height 5\' 3"  (1.6 m), weight 71.215 kg (157 lb), last menstrual period 04/27/2014. GENERAL: Well-developed, well-nourished female in no acute distress, sounds congested, no coughing HEENT: Nontender over frontal and maxillary sinuses. Redness at base of nares HEART: RRR w/o m RESP: normal effort, CTA bilat with good air movement ABDOMEN: Soft, non-tender, S=D, DT 166 BACK: neg CVAT EXTREMITIES: Nontender, no edema NEURO: Alert and oriented  LAB RESULTS Results for orders placed or performed during the hospital encounter of 08/18/14 (from the past 24 hour(s))  Urinalysis, Routine w reflex microscopic     Status: Abnormal   Collection Time: 08/18/14  3:57 PM  Result Value Ref Range   Color, Urine YELLOW YELLOW   APPearance HAZY (A) CLEAR   Specific Gravity, Urine 1.020 1.005 - 1.030   pH 6.5 5.0 - 8.0   Glucose, UA NEGATIVE NEGATIVE mg/dL   Hgb urine dipstick LARGE (A) NEGATIVE   Bilirubin Urine NEGATIVE NEGATIVE   Ketones, ur 15 (A) NEGATIVE mg/dL   Protein, ur NEGATIVE NEGATIVE mg/dL   Urobilinogen, UA 1.0 0.0 - 1.0 mg/dL   Nitrite NEGATIVE NEGATIVE   Leukocytes, UA NEGATIVE NEGATIVE  Urine microscopic-add on     Status: Abnormal   Collection Time: 08/18/14  3:57 PM  Result Value Ref Range    Squamous Epithelial / LPF FEW (A) RARE   RBC / HPF 11-20 <3 RBC/hpf   Bacteria, UA FEW (A) RARE    IMAGING No results found.  MAU COURSE C/W Dr. Marvel Plan advises symptomatic tx.  Urine culture sent  ASSESSMENT 1. Acute URI   2. Microscopic hematuria   G1 at [redacted]w[redacted]d  PLAN Discharge home. Continue rest. Push fluids    Medication List    STOP taking these medications        cephALEXin 500 MG capsule  Commonly known as:  KEFLEX     omeprazole 20 MG capsule  Commonly known as:  PRILOSEC      TAKE these medications        Doxylamine-Pyridoxine 10-10 MG Tbec  Take 2 tablets by mouth at bedtime.     guaiFENesin 600 MG 12 hr tablet  Commonly known as:  MUCINEX  Take 1 tablet (600 mg total) by mouth 2 (two) times daily.     Phenyleph-CPM-DM-APAP 2.5-1-5-160 MG/5ML Susp  Take 30 mLs by mouth 2 (two) times daily as needed (For cold and flu symptoms.).     prenatal multivitamin Tabs tablet  Take 1 tablet by mouth daily at 12 noon.     valACYclovir 1000 MG tablet  Commonly known as:  VALTREX  Take 1 tablet (1,000 mg total) by mouth 2 (two) times daily.       Follow-up Information    Follow up with Logan Bores, MD.   Specialty:  Obstetrics and Gynecology   Why:  Keep your scheduled prenatal appointment   Contact information:   [address] [place] [address] [phone]        Lorene Dy, CNM 08/18/2014  5:03 PM

## 2014-08-18 NOTE — MAU Note (Signed)
Pt presents to MAU with complaints of nasal congestion, headache, coughing and sneezing

## 2014-08-18 NOTE — Discharge Instructions (Signed)
Cool Mist Vaporizers °Vaporizers may help relieve the symptoms of a cough and cold. They add moisture to the air, which helps mucus to become thinner and less sticky. This makes it easier to breathe and cough up secretions. Cool mist vaporizers do not cause serious burns like hot mist vaporizers, which may also be called steamers or humidifiers. Vaporizers have not been proven to help with colds. You should not use a vaporizer if you are allergic to mold. °HOME CARE INSTRUCTIONS °· Follow the package instructions for the vaporizer. °· Do not use anything other than distilled water in the vaporizer. °· Do not run the vaporizer all of the time. This can cause mold or bacteria to grow in the vaporizer. °· Clean the vaporizer after each time it is used. °· Clean and dry the vaporizer well before storing it. °· Stop using the vaporizer if worsening respiratory symptoms develop. °Document Released: 04/06/2004 Document Revised: 07/15/2013 Document Reviewed: 11/27/2012 °ExitCare® Patient Information ©2015 [employer]. This information is not intended to replace advice given to you by your health care provider. Make sure you discuss any questions you have with your health care provider. ° °

## 2014-08-18 NOTE — MAU Note (Signed)
Pt states she started having symptoms about 5 days ago.pt states she doesn't have a fever

## 2014-08-19 LAB — URINE CULTURE
COLONY COUNT: NO GROWTH
Culture: NO GROWTH
SPECIAL REQUESTS: NORMAL

## 2014-08-31 LAB — OB RESULTS CONSOLE HEPATITIS B SURFACE ANTIGEN: Hepatitis B Surface Ag: NEGATIVE

## 2014-08-31 LAB — OB RESULTS CONSOLE GC/CHLAMYDIA
CHLAMYDIA, DNA PROBE: NEGATIVE
Gonorrhea: NEGATIVE

## 2014-08-31 LAB — OB RESULTS CONSOLE RUBELLA ANTIBODY, IGM: RUBELLA: IMMUNE

## 2014-11-09 LAB — OB RESULTS CONSOLE HIV ANTIBODY (ROUTINE TESTING): HIV: NONREACTIVE

## 2014-11-12 ENCOUNTER — Other Ambulatory Visit: Payer: Self-pay | Admitting: Obstetrics and Gynecology

## 2014-11-12 DIAGNOSIS — N6452 Nipple discharge: Secondary | ICD-10-CM

## 2014-11-12 DIAGNOSIS — N63 Unspecified lump in unspecified breast: Secondary | ICD-10-CM

## 2014-11-16 ENCOUNTER — Other Ambulatory Visit: Payer: Self-pay | Admitting: Obstetrics and Gynecology

## 2014-11-16 ENCOUNTER — Ambulatory Visit
Admission: RE | Admit: 2014-11-16 | Discharge: 2014-11-16 | Disposition: A | Discharge status: Home / Self Care | Payer: Medicaid Other | Source: Ambulatory Visit | Attending: Obstetrics and Gynecology | Admitting: Obstetrics and Gynecology

## 2014-11-16 DIAGNOSIS — N63 Unspecified lump in unspecified breast: Secondary | ICD-10-CM

## 2014-11-16 DIAGNOSIS — N6452 Nipple discharge: Secondary | ICD-10-CM

## 2014-11-16 DIAGNOSIS — N6310 Unspecified lump in the right breast, unspecified quadrant: Secondary | ICD-10-CM

## 2014-11-19 ENCOUNTER — Other Ambulatory Visit: Payer: Self-pay | Admitting: Obstetrics and Gynecology

## 2014-11-19 DIAGNOSIS — N6310 Unspecified lump in the right breast, unspecified quadrant: Secondary | ICD-10-CM

## 2014-11-25 ENCOUNTER — Other Ambulatory Visit: Payer: Medicaid Other

## 2014-11-25 ENCOUNTER — Ambulatory Visit
Admission: RE | Admit: 2014-11-25 | Discharge: 2014-11-25 | Disposition: A | Discharge status: Home / Self Care | Payer: Medicaid Other | Source: Ambulatory Visit | Attending: Obstetrics and Gynecology | Admitting: Obstetrics and Gynecology

## 2014-11-25 DIAGNOSIS — N6310 Unspecified lump in the right breast, unspecified quadrant: Secondary | ICD-10-CM

## 2014-11-27 ENCOUNTER — Other Ambulatory Visit: Payer: Self-pay | Admitting: Obstetrics and Gynecology

## 2014-11-27 ENCOUNTER — Ambulatory Visit
Admission: RE | Admit: 2014-11-27 | Discharge: 2014-11-27 | Disposition: A | Discharge status: Home / Self Care | Payer: Medicaid Other | Source: Ambulatory Visit | Attending: Obstetrics and Gynecology | Admitting: Obstetrics and Gynecology

## 2014-11-27 DIAGNOSIS — N6310 Unspecified lump in the right breast, unspecified quadrant: Secondary | ICD-10-CM

## 2014-12-29 LAB — OB RESULTS CONSOLE GBS: GBS: NEGATIVE

## 2015-01-16 ENCOUNTER — Inpatient Hospital Stay (HOSPITAL_COMMUNITY): Payer: Medicaid Other | Admitting: Anesthesiology

## 2015-01-16 ENCOUNTER — Inpatient Hospital Stay (HOSPITAL_COMMUNITY)
Admission: AD | Admit: 2015-01-16 | Discharge: 2015-01-19 | DRG: 774 | Disposition: A | Discharge status: Home / Self Care | Payer: Medicaid Other | Source: Ambulatory Visit | Attending: Obstetrics and Gynecology | Admitting: Obstetrics and Gynecology

## 2015-01-16 ENCOUNTER — Encounter (HOSPITAL_COMMUNITY): Payer: Self-pay | Admitting: *Deleted

## 2015-01-16 ENCOUNTER — Inpatient Hospital Stay (HOSPITAL_COMMUNITY): Payer: Medicaid Other

## 2015-01-16 DIAGNOSIS — A6000 Herpesviral infection of urogenital system, unspecified: Secondary | ICD-10-CM | POA: Diagnosis present

## 2015-01-16 DIAGNOSIS — Z3A37 37 weeks gestation of pregnancy: Secondary | ICD-10-CM | POA: Diagnosis present

## 2015-01-16 DIAGNOSIS — Z23 Encounter for immunization: Secondary | ICD-10-CM | POA: Diagnosis not present

## 2015-01-16 DIAGNOSIS — O9832 Other infections with a predominantly sexual mode of transmission complicating childbirth: Principal | ICD-10-CM | POA: Diagnosis present

## 2015-01-16 DIAGNOSIS — N858 Other specified noninflammatory disorders of uterus: Secondary | ICD-10-CM | POA: Diagnosis present

## 2015-01-16 DIAGNOSIS — Z87891 Personal history of nicotine dependence: Secondary | ICD-10-CM | POA: Diagnosis not present

## 2015-01-16 DIAGNOSIS — O288 Other abnormal findings on antenatal screening of mother: Secondary | ICD-10-CM | POA: Insufficient documentation

## 2015-01-16 LAB — CBC
HEMATOCRIT: 33.6 % — AB (ref 36.0–46.0)
Hemoglobin: 11.1 g/dL — ABNORMAL LOW (ref 12.0–15.0)
MCH: 27.3 pg (ref 26.0–34.0)
MCHC: 33 g/dL (ref 30.0–36.0)
MCV: 82.8 fL (ref 78.0–100.0)
PLATELETS: 266 10*3/uL (ref 150–400)
RBC: 4.06 MIL/uL (ref 3.87–5.11)
RDW: 17.8 % — AB (ref 11.5–15.5)
WBC: 18.4 10*3/uL — ABNORMAL HIGH (ref 4.0–10.5)

## 2015-01-16 LAB — POCT FERN TEST: POCT FERN TEST: NEGATIVE

## 2015-01-16 LAB — TYPE AND SCREEN
ABO/RH(D): A POS
ANTIBODY SCREEN: NEGATIVE

## 2015-01-16 MED ORDER — OXYTOCIN 40 UNITS IN LACTATED RINGERS INFUSION - SIMPLE MED: 62.5000 mL/h | INTRAVENOUS | Status: DC

## 2015-01-16 MED ORDER — OXYCODONE-ACETAMINOPHEN 5-325 MG PO TABS: 1.0000 | ORAL_TABLET | ORAL | Status: DC | PRN

## 2015-01-16 MED ORDER — FLEET ENEMA 7-19 GM/118ML RE ENEM: 1.0000 | ENEMA | RECTAL | Status: DC | PRN

## 2015-01-16 MED ORDER — LACTATED RINGERS IV SOLN: INTRAVENOUS | Status: DC

## 2015-01-16 MED ORDER — LACTATED RINGERS IV SOLN
500.0000 mL | INTRAVENOUS | Status: DC | PRN
  Administered 2015-01-16: 500 mL via INTRAVENOUS

## 2015-01-16 MED ORDER — DEXTROSE 5 % IV SOLN
170.0000 mg | Freq: Three times a day (TID) | INTRAVENOUS | Status: DC
  Administered 2015-01-16 – 2015-01-17 (×2): 170 mg via INTRAVENOUS
  Filled 2015-01-16 (×3): qty 4.25

## 2015-01-16 MED ORDER — ACETAMINOPHEN 325 MG PO TABS
650.0000 mg | ORAL_TABLET | ORAL | Status: DC | PRN
  Administered 2015-01-16: 650 mg via ORAL
  Filled 2015-01-16: qty 2

## 2015-01-16 MED ORDER — TERBUTALINE SULFATE 1 MG/ML IJ SOLN: 0.2500 mg | Freq: Once | INTRAMUSCULAR | Status: AC | PRN

## 2015-01-16 MED ORDER — AMPICILLIN SODIUM 2 G IJ SOLR
2.0000 g | Freq: Four times a day (QID) | INTRAMUSCULAR | Status: DC
Start: 2015-01-16 — End: 2015-01-17
  Administered 2015-01-16: 2 g via INTRAVENOUS
  Filled 2015-01-16 (×3): qty 2000

## 2015-01-16 MED ORDER — OXYTOCIN 40 UNITS IN LACTATED RINGERS INFUSION - SIMPLE MED
1.0000 m[IU]/min | INTRAVENOUS | Status: DC
  Administered 2015-01-16: 2 m[IU]/min via INTRAVENOUS
  Filled 2015-01-16: qty 1000

## 2015-01-16 MED ORDER — FENTANYL 2.5 MCG/ML BUPIVACAINE 1/10 % EPIDURAL INFUSION (WH - ANES)
INTRAMUSCULAR | Status: DC | PRN
  Administered 2015-01-16: 14 mL/h via EPIDURAL

## 2015-01-16 MED ORDER — ONDANSETRON HCL 4 MG/2ML IJ SOLN: 4.0000 mg | Freq: Four times a day (QID) | INTRAMUSCULAR | Status: DC | PRN

## 2015-01-16 MED ORDER — FENTANYL 2.5 MCG/ML BUPIVACAINE 1/10 % EPIDURAL INFUSION (WH - ANES): 14.0000 mL/h | INTRAMUSCULAR | Status: DC | PRN

## 2015-01-16 MED ORDER — CITRIC ACID-SODIUM CITRATE 334-500 MG/5ML PO SOLN: 30.0000 mL | ORAL | Status: DC | PRN

## 2015-01-16 MED ORDER — EPHEDRINE 5 MG/ML INJ
10.0000 mg | INTRAVENOUS | Status: DC | PRN
  Filled 2015-01-16: qty 2

## 2015-01-16 MED ORDER — DIPHENHYDRAMINE HCL 50 MG/ML IJ SOLN
12.5000 mg | INTRAMUSCULAR | Status: DC | PRN
Start: 2015-01-16 — End: 2015-01-19
  Administered 2015-01-16: 12.5 mg via INTRAVENOUS
  Filled 2015-01-16: qty 1

## 2015-01-16 MED ORDER — OXYCODONE-ACETAMINOPHEN 5-325 MG PO TABS
2.0000 | ORAL_TABLET | Freq: Once | ORAL | Status: AC
  Administered 2015-01-16: 2 via ORAL
  Filled 2015-01-16: qty 2

## 2015-01-16 MED ORDER — FENTANYL 2.5 MCG/ML BUPIVACAINE 1/10 % EPIDURAL INFUSION (WH - ANES)
14.0000 mL/h | INTRAMUSCULAR | Status: DC | PRN
  Administered 2015-01-16: 14 mL/h via EPIDURAL
  Filled 2015-01-16: qty 125

## 2015-01-16 MED ORDER — BUTORPHANOL TARTRATE 1 MG/ML IJ SOLN: 1.0000 mg | INTRAMUSCULAR | Status: DC | PRN

## 2015-01-16 MED ORDER — LIDOCAINE HCL (PF) 1 % IJ SOLN
INTRAMUSCULAR | Status: DC | PRN
  Administered 2015-01-16: 10 mL

## 2015-01-16 MED ORDER — OXYTOCIN BOLUS FROM INFUSION: 500.0000 mL | INTRAVENOUS | Status: DC

## 2015-01-16 MED ORDER — LIDOCAINE HCL (PF) 1 % IJ SOLN
30.0000 mL | INTRAMUSCULAR | Status: DC | PRN
  Filled 2015-01-16: qty 30

## 2015-01-16 MED ORDER — OXYCODONE-ACETAMINOPHEN 5-325 MG PO TABS: 2.0000 | ORAL_TABLET | ORAL | Status: DC | PRN

## 2015-01-16 MED ORDER — VALACYCLOVIR HCL 500 MG PO TABS
500.0000 mg | ORAL_TABLET | Freq: Two times a day (BID) | ORAL | Status: DC
  Administered 2015-01-16: 500 mg via ORAL
  Filled 2015-01-16 (×2): qty 1

## 2015-01-16 MED ORDER — PHENYLEPHRINE 40 MCG/ML (10ML) SYRINGE FOR IV PUSH (FOR BLOOD PRESSURE SUPPORT)
80.0000 ug | PREFILLED_SYRINGE | INTRAVENOUS | Status: DC | PRN
Start: 2015-01-16 — End: 2015-01-19
  Filled 2015-01-16: qty 20
  Filled 2015-01-16: qty 2

## 2015-01-16 NOTE — MAU Note (Signed)
Contractions started around 0200 this morning denies LOF, some bloody show.

## 2015-01-16 NOTE — Anesthesia Preprocedure Evaluation (Signed)
Anesthesia Evaluation  Patient identified by MRN, date of birth, ID band Patient awake and Patient confused    Reviewed: Allergy & Precautions, H&P , NPO status , Patient's Chart, lab work & pertinent test results  Airway Mallampati: II       Dental   Pulmonary former smoker,  breath sounds clear to auscultation  Pulmonary exam normal       Cardiovascular Exercise Tolerance: Good Normal cardiovascular examRhythm:regular Rate:Normal     Neuro/Psych    GI/Hepatic   Endo/Other    Renal/GU      Musculoskeletal   Abdominal   Peds  Hematology   Anesthesia Other Findings   Reproductive/Obstetrics (+) Pregnancy                             Anesthesia Physical Anesthesia Plan  ASA: II  Anesthesia Plan: Epidural   Post-op Pain Management:    Induction:   Airway Management Planned:   Additional Equipment:   Intra-op Plan:   Post-operative Plan:   Informed Consent: I have reviewed the patients History and Physical, chart, labs and discussed the procedure including the risks, benefits and alternatives for the proposed anesthesia with the patient or authorized representative who has indicated his/her understanding and acceptance.     Plan Discussed with:   Anesthesia Plan Comments:         Anesthesia Quick Evaluation

## 2015-01-16 NOTE — Progress Notes (Signed)
ANTIBIOTIC CONSULT NOTE - INITIAL  Pharmacy Consult for Gentamicin Indication: Maternal temperature  Allergies  Allergen Reactions  . Mushroom Extract Complex Anaphylaxis    Facial swelling  . Peanut-Containing Drug Products Anaphylaxis  . Milk-Related Compounds Nausea And Vomiting  . Septra [Sulfamethoxazole-Trimethoprim] Hives    Patient Measurements: Height: 5\' 4"  (162.6 cm) Weight: 196 lb (88.905 kg) IBW/kg (Calculated) : 54.7 Adjusted Body Weight: 65 kg  Vital Signs: Temp: 101.7 F (38.7 C) (06/25 2131) Temp Source: Axillary (06/25 2131) BP: 131/88 mmHg (06/25 2231) Pulse Rate: 110 (06/25 2231)  Labs:  Recent Labs  01/16/15 1615  WBC 18.4*  HGB 11.1*  PLT 266   No results for input(s): GENTTROUGH, GENTPEAK, GENTRANDOM in the last 72 hours.   Microbiology: Recent Results (from the past 720 hour(s))  OB RESULT CONSOLE Group B Strep     Status: None   Collection Time: 12/29/14 12:00 AM  Result Value Ref Range Status   GBS Negative  Final    Medications:  Ampicillin 2 grams Q 6 hours  Assessment: 24 y.o. female G1P0 at [redacted]w[redacted]d in labor, AROM for light meconium. Temp trending up 99.8>>101.7. Starting ampicillin and gentamicin for maternal temp.  Estimated Ke = 0.317, Vd = 0.38  Goal of Therapy:  Gentamicin peak 6-8 mg/L and Trough < 1 mg/L  Plan:  Gentamicin  170 mg IV every 8 hrs  Check Scr with next labs if gentamicin continued. Will check gentamicin levels if continued > 72hr or clinically indicated.  Bevelyn Arriola Scarlett 01/16/2015,10:48 PM

## 2015-01-16 NOTE — Anesthesia Procedure Notes (Signed)
Epidural Patient location during procedure: OB Start time: 01/16/2015 5:54 PM End time: 01/16/2015 6:10 PM  Staffing Anesthesiologist: Alexis Frock  Preanesthetic Checklist Completed: patient identified, site marked, surgical consent, pre-op evaluation, timeout performed, IV checked, risks and benefits discussed and monitors and equipment checked  Epidural Patient position: sitting Prep: site prepped and draped and DuraPrep Patient monitoring: heart rate, continuous pulse ox and blood pressure Approach: midline Location: L2-L3 Injection technique: LOR air  Needle:  Needle type: Tuohy  Needle gauge: 17 G Needle length: 9 cm and 9 Needle insertion depth: 6 cm Catheter type: closed end flexible Catheter size: 19 Gauge Catheter at skin depth: 14 cm Test dose: negative  Assessment Events: blood not aspirated, injection not painful, no injection resistance, negative IV test and no paresthesia  Additional Notes   Patient tolerated the insertion well without complications.Reason for block:procedure for pain

## 2015-01-16 NOTE — Progress Notes (Signed)
Patient ID: Kathryn Fox, female   DOB: March 28, 1991, 24 y.o.   MRN: [id number]  Not as uncomfortable  AFVSS  FHTs 145-150, mod var, category 1 toco irreg  SVE 4/70/-1  AROM for light meconium, d/w pt.  W/o comp/diff  Continue current mgmt Epidural prn

## 2015-01-16 NOTE — H&P (Signed)
Kathryn Fox is a 24 y.o. female G1P0 at 48+ with contractions and cervical change in the MAU.  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  GBBS negative.  D/W pt early labor   Maternal Medical History:  Reason for admission: Contractions.   Contractions: Frequency: regular.   Perceived severity is moderate.    Fetal activity: Perceived fetal activity is normal.    Prenatal Complications - Diabetes: none.    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 Chronic abdominal pain   . Headache   . Infection     UTI  . Depression     hx of, no current problems  . Vaginal Pap smear, abnormal     bx, f/u ok since  neck pain  Past Surgical History  Procedure Laterality Date  . Breast fibroadenoma surgery     Family History: family history includes Cancer in her maternal aunt and maternal grandfather; Diabetes in her maternal aunt and mother; Hyperlipidemia in her mother; Hypertension in her mother. Social History:  reports that she quit smoking about 7 months ago. Her smoking use included Cigarettes. She smoked 0.20 packs per day. She has quit using smokeless tobacco. She reports that she does not drink alcohol or use illicit drugs.  Meds: PNV, Valtrex All Milk, mushroom, peanut, Septra    Prenatal Transfer Tool  Maternal Diabetes: No Genetic Screening: Declined Maternal Ultrasounds/Referrals: Normal Fetal Ultrasounds or other Referrals:  None Maternal Substance Abuse:  No Significant Maternal Medications:  None Significant Maternal Lab Results:  Lab values include: Group B Strep negative Other Comments:  None  Review of Systems  Constitutional: Negative.   HENT: Negative.   Eyes: Negative.   Respiratory: Negative.   Cardiovascular: Negative.   Gastrointestinal: Positive for abdominal pain.  Genitourinary: Negative.    Musculoskeletal: Negative.   Skin: Negative.   Neurological: Negative.   Psychiatric/Behavioral: Negative.     Dilation: 2.5 Effacement (%): 80 Station: -2 Exam by:: A. Gagliardo, RN Blood pressure 132/86, pulse 110, resp. rate 20, height 5\' 4"  (1.626 m), weight 88.905 kg (196 lb), last menstrual period 04/27/2014. Maternal Exam:  Uterine Assessment: Contraction strength is moderate.  Contraction frequency is regular.   Abdomen: Patient reports no abdominal tenderness. Fundal height is appropriate for gestation.   Estimated fetal weight is 7-8#.   Fetal presentation: vertex  Introitus: Normal vulva. Normal vagina.    Physical Exam  Constitutional: She is oriented to person, place, and time. She appears well-developed and well-nourished.  HENT:  Head: Normocephalic and atraumatic.  Cardiovascular: Normal rate and regular rhythm.   Respiratory: Effort normal and breath sounds normal. No respiratory distress. She has no wheezes.  GI: Soft. Bowel sounds are normal. She exhibits no distension. There is no tenderness.  Musculoskeletal: Normal range of motion.  Neurological: She is alert and oriented to person, place, and time.  Skin: Skin is warm and dry.  Psychiatric: She has a normal mood and affect. Her behavior is normal.    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

## 2015-01-17 ENCOUNTER — Encounter (HOSPITAL_COMMUNITY): Payer: Self-pay | Admitting: Obstetrics and Gynecology

## 2015-01-17 LAB — PROTEIN / CREATININE RATIO, URINE
CREATININE, URINE: 86 mg/dL
PROTEIN CREATININE RATIO: 3.33 mg/mg{creat} — AB (ref 0.00–0.15)
Total Protein, Urine: 286 mg/dL

## 2015-01-17 LAB — RPR: RPR Ser Ql: NONREACTIVE

## 2015-01-17 MED ORDER — OXYCODONE-ACETAMINOPHEN 5-325 MG PO TABS
1.0000 | ORAL_TABLET | ORAL | Status: DC | PRN
  Administered 2015-01-18 – 2015-01-19 (×2): 1 via ORAL
  Filled 2015-01-17 (×2): qty 1

## 2015-01-17 MED ORDER — OXYCODONE-ACETAMINOPHEN 5-325 MG PO TABS
2.0000 | ORAL_TABLET | ORAL | Status: DC | PRN
  Administered 2015-01-17: 2 via ORAL
  Filled 2015-01-17: qty 2

## 2015-01-17 MED ORDER — CLINDAMYCIN PHOSPHATE 900 MG/50ML IV SOLN
900.0000 mg | Freq: Three times a day (TID) | INTRAVENOUS | Status: DC
  Administered 2015-01-17: 900 mg via INTRAVENOUS
  Filled 2015-01-17 (×3): qty 50

## 2015-01-17 MED ORDER — IBUPROFEN 600 MG PO TABS
600.0000 mg | ORAL_TABLET | Freq: Four times a day (QID) | ORAL | Status: DC
  Administered 2015-01-17 – 2015-01-19 (×9): 600 mg via ORAL
  Filled 2015-01-17 (×9): qty 1

## 2015-01-17 MED ORDER — BENZOCAINE-MENTHOL 20-0.5 % EX AERO: 1.0000 "application " | INHALATION_SPRAY | CUTANEOUS | Status: DC | PRN

## 2015-01-17 MED ORDER — ONDANSETRON HCL 4 MG/2ML IJ SOLN: 4.0000 mg | INTRAMUSCULAR | Status: DC | PRN

## 2015-01-17 MED ORDER — LANOLIN HYDROUS EX OINT: TOPICAL_OINTMENT | CUTANEOUS | Status: DC | PRN

## 2015-01-17 MED ORDER — DIBUCAINE 1 % RE OINT: 1.0000 "application " | TOPICAL_OINTMENT | RECTAL | Status: DC | PRN

## 2015-01-17 MED ORDER — LACTATED RINGERS IV SOLN
INTRAVENOUS | Status: DC
Start: 2015-01-17 — End: 2015-01-19

## 2015-01-17 MED ORDER — ZOLPIDEM TARTRATE 5 MG PO TABS: 5.0000 mg | ORAL_TABLET | Freq: Every evening | ORAL | Status: DC | PRN

## 2015-01-17 MED ORDER — DIPHENHYDRAMINE HCL 25 MG PO CAPS: 25.0000 mg | ORAL_CAPSULE | Freq: Four times a day (QID) | ORAL | Status: DC | PRN

## 2015-01-17 MED ORDER — WITCH HAZEL-GLYCERIN EX PADS: 1.0000 "application " | MEDICATED_PAD | CUTANEOUS | Status: DC | PRN

## 2015-01-17 MED ORDER — SENNOSIDES-DOCUSATE SODIUM 8.6-50 MG PO TABS
2.0000 | ORAL_TABLET | ORAL | Status: DC
  Administered 2015-01-17 – 2015-01-18 (×2): 2 via ORAL
  Filled 2015-01-17 (×2): qty 2

## 2015-01-17 MED ORDER — SIMETHICONE 80 MG PO CHEW: 80.0000 mg | CHEWABLE_TABLET | ORAL | Status: DC | PRN

## 2015-01-17 MED ORDER — ACETAMINOPHEN 325 MG PO TABS: 650.0000 mg | ORAL_TABLET | ORAL | Status: DC | PRN

## 2015-01-17 MED ORDER — PRENATAL MULTIVITAMIN CH
1.0000 | ORAL_TABLET | Freq: Every day | ORAL | Status: DC
  Administered 2015-01-17: 1 via ORAL
  Filled 2015-01-17 (×2): qty 1

## 2015-01-17 MED ORDER — ONDANSETRON HCL 4 MG PO TABS: 4.0000 mg | ORAL_TABLET | ORAL | Status: DC | PRN

## 2015-01-17 NOTE — Anesthesia Postprocedure Evaluation (Signed)
  Anesthesia Post-op Note  Patient: Kathryn Fox  Procedure(s) Performed: * No procedures listed *  Patient Location: PACU and Mother/Baby  Anesthesia Type:Epidural  Level of Consciousness: awake, alert  and oriented  Airway and Oxygen Therapy: Patient Spontanous Breathing  Post-op Pain: none  Post-op Assessment: Post-op Vital signs reviewed, Patient's Cardiovascular Status Stable, No headache, No backache, No residual numbness and No residual motor weakness  Post-op Vital Signs: Reviewed and stable  Complications: No apparent anesthesia complications

## 2015-01-17 NOTE — Progress Notes (Signed)
Patient ID: Kathryn Fox, female   DOB: [birth year], 24 y.o.   MRN: [id number]  Pt c/o pressure  T = 103, VSS gen uncomfortable FHTs 215, min var toco q 2-3 min  SVE ant lip  Rapid change, anticioate pushing and delivery soon Has received amp and gent.

## 2015-01-17 NOTE — Lactation Note (Signed)
This note was copied from the chart of [name]. Lactation Consultation Note  Patient Name: [name] Today's Date: 01/17/2015 Reason for consult: Initial assessment;NICU baby  Initial visit of Mom whose first baby is in the NICU, 8 hrs pp.  Baby born at 37w 6d, have tachypnea, and low CBGs.  Double pumping begun, and LC assisted Mom with another pumping, answering any questions she had, and sharing basic information with her.  Demonstrated breast massage, and manual breast expression.  Mom finding manual breast expression uncomfortable at this point.  Mom assisted with pumping using the premie setting.   NICU brochure given and instructed on IP and OP Lactation services available to her. Mom has [facility], and is aware of their pump loaner program.  Encouraged her to call today, or tomorrow prior to discharge to arrange for pump for home use.  Mom aware of pumps available to her while visiting in the NICU.   To follow up in am, and Mom knows to call her RN for any assistance prn.   Lactation Tools Discussed/Used WIC Program: Yes Pump Review: Setup, frequency, and cleaning;Milk Storage Initiated by:: [place] RN Date initiated:: 01/17/15   Consult Status Consult Status: Follow-up Date: 01/18/15 Follow-up type: [place] 01/17/2015, 10:26 AM

## 2015-01-17 NOTE — Progress Notes (Addendum)
Post Partum Day 0 Subjective: no complaints, up ad lib, voiding, tolerating PO and nl lochia, pain controlled  Objective: Blood pressure 129/78, pulse 112, temperature 98.6 F (37 C), temperature source Oral, resp. rate 20, height 5\' 4"  (1.626 m), weight 88.905 kg (196 lb), last menstrual period 04/27/2014, SpO2 99 %, unknown if currently breastfeeding.  Physical Exam:  General: alert and no distress Lochia: appropriate Uterine Fundus: firm   Recent Labs  01/16/15 1615  HGB 11.1*  HCT 33.6*    Assessment/Plan: Plan for discharge tomorrow, Breastfeeding and Lactation consult.  Baby in NICU - abx   LOS: 1 day   Fox, Kathryn Eyster 01/17/2015, 8:37 AM

## 2015-01-18 LAB — CBC
HCT: 29.8 % — ABNORMAL LOW (ref 36.0–46.0)
HEMOGLOBIN: 9.7 g/dL — AB (ref 12.0–15.0)
MCH: 27.3 pg (ref 26.0–34.0)
MCHC: 32.6 g/dL (ref 30.0–36.0)
MCV: 83.9 fL (ref 78.0–100.0)
Platelets: 213 10*3/uL (ref 150–400)
RBC: 3.55 MIL/uL — AB (ref 3.87–5.11)
RDW: 18.2 % — ABNORMAL HIGH (ref 11.5–15.5)
WBC: 22.4 10*3/uL — AB (ref 4.0–10.5)

## 2015-01-18 NOTE — Progress Notes (Signed)
Post Partum Day 1 Subjective: no complaints, up ad lib, voiding, tolerating PO and nl lochia, pain controlled  Objective: Blood pressure 146/98, pulse 91, temperature 98 F (36.7 C), temperature source Oral, resp. rate 18, height 5\' 4"  (1.626 m), weight 88.905 kg (196 lb), last menstrual period 04/27/2014, SpO2 98 %, unknown if currently breastfeeding.  Physical Exam:  General: alert and no distress Lochia: appropriate Uterine Fundus: firm   Recent Labs  01/16/15 1615 01/18/15 0515  HGB 11.1* 9.7*  HCT 33.6* 29.8*    Assessment/Plan: Plan for discharge tomorrow, Breastfeeding and Lactation consult.  Routine care.     LOS: 2 days   Bovard-Stuckert, Janaya Broy 01/18/2015, 8:31 AM

## 2015-01-19 MED ORDER — TETANUS-DIPHTH-ACELL PERTUSSIS 5-2.5-18.5 LF-MCG/0.5 IM SUSP
0.5000 mL | Freq: Once | INTRAMUSCULAR | Status: AC
  Administered 2015-01-19: 0.5 mL via INTRAMUSCULAR
  Filled 2015-01-19: qty 0.5

## 2015-01-19 MED ORDER — OXYCODONE-ACETAMINOPHEN 5-325 MG PO TABS: 1.0000 | ORAL_TABLET | Freq: Four times a day (QID) | ORAL | Status: DC | PRN

## 2015-01-19 MED ORDER — MEDROXYPROGESTERONE ACETATE 150 MG/ML IM SUSP
150.0000 mg | Freq: Once | INTRAMUSCULAR | Status: AC
  Administered 2015-01-19: 150 mg via INTRAMUSCULAR
  Filled 2015-01-19: qty 1

## 2015-01-19 MED ORDER — IBUPROFEN 800 MG PO TABS: 800.0000 mg | ORAL_TABLET | Freq: Four times a day (QID) | ORAL | Status: DC

## 2015-01-19 MED ORDER — PRENATAL MULTIVITAMIN CH: 1.0000 | ORAL_TABLET | Freq: Every day | ORAL | Status: DC

## 2015-01-19 NOTE — Lactation Note (Signed)
This note was copied from the chart of Kathryn Fox. Lactation Consultation Note  Patient Name: Kathryn Fox Today's Date: 01/19/2015 Reason for consult: Follow-up assessment;NICU baby NICU baby 46 hours old. Assisted mom to latch baby to left breast in football position. Demonstrated "teacup" hold to mom and after a few attempts, baby able to latch deeply, suckling rhythmically with a few swallows noted. Demonstrated to mom how to express colostrum to enc baby to open wide and latch deeply. Mom reports football position more comfortable. Mom's breasts are large, and baby did lose latch after about 5 minutes. Demonstrated to mom how to support breast with a rolled wash cloth. Mom able to compress breast and latch baby deeply again. Demonstrated to mom how to flange lower lip. Baby maintained a deep latch with some swallows for another 15 minutes. Discussed with mom that baby will start to swallow more at the breast as her milk comes in. Enc mom to pump after nursing and nuzzling with baby.   Maternal Data    Feeding Feeding Type: Breast Fed Nipple Type: Slow - flow Length of feed: 15 min  LATCH Score/Interventions Latch: Repeated attempts needed to sustain latch, nipple held in mouth throughout feeding, stimulation needed to elicit sucking reflex. Intervention(s): Adjust position;Assist with latch;Breast compression  Audible Swallowing: A few with stimulation Intervention(s): Skin to skin;Hand expression  Type of Nipple: Everted at rest and after stimulation  Comfort (Breast/Nipple): Soft / non-tender     Hold (Positioning): Assistance needed to correctly position infant at breast and maintain latch.  LATCH Score: 7  Lactation Tools Discussed/Used WIC Program: Yes   Consult Status Consult Status: PRN    Inocente Salles 01/19/2015, 3:14 PM

## 2015-01-19 NOTE — Lactation Note (Signed)
This note was copied from the chart of Kathryn Fox. Lactation Consultation Note  Patient Name: Kathryn Fox Today's Date: 01/19/2015 Reason for consult: Follow-up assessment;NICU baby NICU baby 47 hours old. Mom states that she has an appointment with [facility] for a DEBP but does not want to take a [facility] loaner. Demonstrated to mom how to use piston in pumping kit for pumping and also given 2 manual pumps with instructions. Mom aware of pumping rooms in NICU. Reviewed engorgement prevention and treatment. Enc mom to keep pumping 8 times a day, and sleeping 5-6 hours at night. Enc mom to call for [facility] assistance at baby's next feeding.   Maternal Data    Feeding Feeding Type: Formula Nipple Type: Slow - flow  LATCH Score/Interventions Latch: Repeated attempts needed to sustain latch, nipple held in mouth throughout feeding, stimulation needed to elicit sucking reflex. Intervention(s): Adjust position;Assist with latch  Audible Swallowing: A few with stimulation Intervention(s): Skin to skin  Type of Nipple: Everted at rest and after stimulation  Comfort (Breast/Nipple): Soft / non-tender     Hold (Positioning): Assistance needed to correctly position infant at breast and maintain latch.  LATCH Score: 7  Lactation Tools Discussed/Used     Consult Status Consult Status: PRN    Inocente Salles 01/19/2015, 1:47 PM

## 2015-01-19 NOTE — Clinical Social Work Maternal (Signed)
CLINICAL SOCIAL WORK MATERNAL/CHILD NOTE  Patient Details  Name: Kathryn Fox MRN: [id number] Date of Birth: 03/10/1991  Date:  01/19/2015  Clinical Social Worker Initiating Note:  Kathryn Swamy E. Abdulah Iqbal, LCSW Date/ Time Initiated:  01/19/15/1200     Child's Name:  Kathryn Fox   Legal Guardian:   (Parents: Kathryn Fox and Kathryn Fox)   Need for Interpreter:  None   Date of Referral:        Reason for Referral:   (No referral-NICU admission)   Referral Source:      Address:  [address] [place], [place] [address]  Phone number:  [phone]   Household Members:  Significant Other   Natural Supports (not living in the home):  Friends, Immediate Family   Professional Supports:     Employment:     Type of Work:  ([name] states she was working at the [employer] until she was laid off.  She states FOB is a disabled veteran)   Education:      Financial Resources:  Medicaid   Other Resources:  WIC, Food Stamps    Cultural/Religious Considerations Which May Impact Care: None stated  Strengths:  Ability to meet basic needs , Compliance with medical plan , Home prepared for child , Understanding of illness, Pediatrician chosen  (Pediatric follow up will be at [place] [facility].  [name] states she was told to make an appointment and has done so for Tuesday 01/26/15.)   Risk Factors/Current Problems:  Other (Comment) (PNR states hx of depression, however, [name] denies hx other than "little meltdowns.")   Cognitive State:  Alert , Insightful , Linear Thinking    Mood/Affect:  Euthymic , Calm , Comfortable , Relaxed , Interested    CSW Assessment: CSW met with [name] in her third floor room/318 to introduce myself, offer support and complete assessment due to NICU admission at 37.6 weeks.  [name] was pleasant and receptive to CSW intervention.  She reports feeling well today and seems to have a good understanding of baby's medical situation at this time as she was able  to update CSW on her status.   CSW asked [name] to share her story of labor and delivery as well as baby's admission to NICU and how she felt emotionally throughout her experience.  [name] was open to talking with CSW and sharing her feelings.  She states baby's admission to NICU was "bittersweet" because she didn't want to be away from her baby, but "knew she'd get the proper care there (NICU)."  [name] is keeping this (necessary care baby is receiving) as her focus and acknowledges the situation as temporary.  CSW assisted her in identifying strengths, which she was able to.  She is pleased with baby's progress.   CSW provided education regarding PPD signs and symptoms to watch for and asked that [name] commit to talking with CSW and or her doctor if symptoms arise at any time.  She agreed.  CSW also discussed common emotions often experienced during the first two weeks after delivery, keeping in mind the separation that is inevitably caused by baby's admission to NICU.  [name] denies any hx of depression, although she states she has had "little meltdowns" in the past.  She does not think they were anything to be concerned about.   [name] states she has a good support system and names FOB (whom she refers to as her fiance), her mother, step-father, 2 sisters and, friend Kathryn Fox as main support people.  [name] reports,   however, that with the amount of support she has, it has been difficult to have space and get rest.  CSW encouraged her to advocate for her needs.  She stated this was hard, especially with her mother.  CSW suggested she think of things that her supports can do for her, while requesting space in order to rest.   [name] reports having all needed baby supplies at home and has chosen a pediatrician.  She states no issues with transportation, however, concern over the cost of gas to get back and forth to the hospital as much as she will like after her own discharge today.  CSW offered gas cards from [employer].   [name] accepted and was greatly appreciative.  She states no further questions, concerns or needs at this time.  CSW explained ongoing support services offered by NICU CSW and provided contact information.  [name] seemed very appreciative of the visit and thanked CSW.  CSW Plan/Description:  Psychosocial Support and Ongoing Assessment of Needs, Patient/Family Education     Kathryn Schwoerer Elizabeth, LCSW 01/19/2015, 1:09 PM 

## 2015-01-19 NOTE — Discharge Summary (Signed)
Obstetric Discharge Summary Reason for Admission: onset of labor Prenatal Procedures: none Intrapartum Procedures: spontaneous vaginal delivery Postpartum Procedures: none Complications-Operative and Postpartum: none HEMOGLOBIN  Date Value Ref Range Status  01/18/2015 9.7* 12.0 - 15.0 g/dL Final   HCT  Date Value Ref Range Status  01/18/2015 29.8* 36.0 - 46.0 % Final    Physical Exam:  General: alert and no distress Lochia: appropriate Uterine Fundus: firm   Discharge Diagnoses: Term Pregnancy-delivered  Discharge Information: Date: 01/19/2015 Activity: pelvic rest Diet: routine Medications: PNV, Ibuprofen and Percocet Condition: stable Instructions: refer to practice specific booklet Discharge to: home Follow-up Information    Follow up with Bovard-Stuckert, Morgen Linebaugh, MD. Schedule an appointment as soon as possible for a visit in 6 weeks.   Specialty:  Obstetrics and Gynecology   Why:  for postpartum check   Contact information:   [address] [address] [phone]       Newborn Data: Live born female  Birth Weight: 5 lb 8.9 oz (2520 g) APGAR: 1, 6  NICU for abx.  Bovard-Stuckert, Ehab Humber 01/19/2015, 8:16 AM

## 2015-01-19 NOTE — Progress Notes (Signed)
Post Partum Day 2 Subjective: no complaints, up ad lib, voiding, tolerating PO and nl lochia, pain controlled  Objective: Blood pressure 133/71, pulse 99, temperature 98.4 F (36.9 C), temperature source Oral, resp. rate 18, height 5\' 4"  (1.626 m), weight 88.905 kg (196 lb), last menstrual period 04/27/2014, SpO2 98 %, unknown if currently breastfeeding.  Physical Exam:  General: alert and no distress Lochia: appropriate Uterine Fundus: firm   Recent Labs  01/16/15 1615 01/18/15 0515  HGB 11.1* 9.7*  HCT 33.6* 29.8*    Assessment/Plan: Discharge home, Breastfeeding and Lactation consult.  Routine care.  D/C with Motrin, percocet and PNV, f/u 6 weeks   LOS: 3 days   Bovard-Stuckert, Marilyn Wing 01/19/2015, 8:11 AM

## 2015-10-20 ENCOUNTER — Other Ambulatory Visit (HOSPITAL_COMMUNITY)
Admission: RE | Admit: 2015-10-20 | Discharge: 2015-10-20 | Disposition: A | Discharge status: Home / Self Care | Payer: Medicaid Other | Source: Ambulatory Visit | Attending: Family Medicine | Admitting: Family Medicine

## 2015-10-20 ENCOUNTER — Encounter: Payer: Self-pay | Admitting: Family Medicine

## 2015-10-20 ENCOUNTER — Ambulatory Visit (INDEPENDENT_AMBULATORY_CARE_PROVIDER_SITE_OTHER): Payer: Medicaid Other | Admitting: Family Medicine

## 2015-10-20 ENCOUNTER — Other Ambulatory Visit: Payer: Self-pay | Admitting: Family Medicine

## 2015-10-20 VITALS — BP 118/72 | HR 84 | Temp 98.4°F | Ht 64.0 in | Wt 144.9 lb

## 2015-10-20 DIAGNOSIS — Z0000 Encounter for general adult medical examination without abnormal findings: Secondary | ICD-10-CM | POA: Diagnosis present

## 2015-10-20 DIAGNOSIS — R739 Hyperglycemia, unspecified: Secondary | ICD-10-CM

## 2015-10-20 DIAGNOSIS — Z113 Encounter for screening for infections with a predominantly sexual mode of transmission: Secondary | ICD-10-CM | POA: Diagnosis present

## 2015-10-20 DIAGNOSIS — Z202 Contact with and (suspected) exposure to infections with a predominantly sexual mode of transmission: Secondary | ICD-10-CM

## 2015-10-20 LAB — CBC WITH DIFFERENTIAL/PLATELET
BASOS ABS: 0 10*3/uL (ref 0.0–0.1)
Basophils Relative: 0 % (ref 0–1)
Eosinophils Absolute: 0.3 10*3/uL (ref 0.0–0.7)
Eosinophils Relative: 3 % (ref 0–5)
HEMATOCRIT: 37.4 % (ref 36.0–46.0)
Hemoglobin: 12.5 g/dL (ref 12.0–15.0)
LYMPHS PCT: 35 % (ref 12–46)
Lymphs Abs: 3 10*3/uL (ref 0.7–4.0)
MCH: 27.7 pg (ref 26.0–34.0)
MCHC: 33.4 g/dL (ref 30.0–36.0)
MCV: 82.7 fL (ref 78.0–100.0)
MPV: 9.6 fL (ref 8.6–12.4)
Monocytes Absolute: 0.4 10*3/uL (ref 0.1–1.0)
Monocytes Relative: 5 % (ref 3–12)
NEUTROS PCT: 57 % (ref 43–77)
Neutro Abs: 5 10*3/uL (ref 1.7–7.7)
Platelets: 336 10*3/uL (ref 150–400)
RBC: 4.52 MIL/uL (ref 3.87–5.11)
RDW: 13.5 % (ref 11.5–15.5)
WBC: 8.7 10*3/uL (ref 4.0–10.5)

## 2015-10-20 LAB — POCT HEMOGLOBIN: HEMOGLOBIN: 12.5 g/dL (ref 12.2–16.2)

## 2015-10-20 NOTE — Progress Notes (Signed)
Subjective    Kathryn Fox is a 25 y.o. female that presents for yearly physical exam.   Concerns:  None  Goals    None      G1P1001 Wt Readings from Last 3 Encounters:  10/20/15 144 lb 14.4 oz (65.726 kg)  01/16/15 196 lb (88.905 kg)  08/18/14 157 lb (71.215 kg)   Last period: 10/20/2015 Regular periods: Yes Heavy bleeding: yes during period for 3 days  Sexually active: No Birth control or hormonal therapy: None Hx of STD: Patient desires STD screening Dyspareunia: No Hot flashes: No Vaginal discharge: No Dysuria: No  Last mammogram: N/a Breast mass or concerns: Yes, has met with breast center and will call if needed Last Pap: 09/2013  History of abnormal pap: Yes, 2015   FH of breast, uterine, ovarian, colon cancer: No  Past Medical History  Diagnosis Date  . Chronic abdominal pain   . Headache   . Infection     UTI  . Depression     hx of, no current problems  . Vaginal Pap smear, abnormal     bx, f/u ok since  . SVD (spontaneous vaginal delivery) 01/17/2015    Past Surgical History  Procedure Laterality Date  . Breast fibroadenoma surgery      Current Outpatient Prescriptions on File Prior to Visit  Medication Sig Dispense Refill  . guaiFENesin (MUCINEX) 600 MG 12 hr tablet Take 1 tablet (600 mg total) by mouth 2 (two) times daily. 30 tablet 1  . ibuprofen (ADVIL,MOTRIN) 800 MG tablet Take 1 tablet (800 mg total) by mouth every 6 (six) hours. 45 tablet 1  . oxyCODONE-acetaminophen (PERCOCET/ROXICET) 5-325 MG per tablet Take 1-2 tablets by mouth every 6 (six) hours as needed for severe pain. 30 tablet 0  . Prenatal Vit-Fe Fumarate-FA (PRENATAL MULTIVITAMIN) TABS tablet Take 1 tablet by mouth daily at 12 noon. 30 tablet 12   No current facility-administered medications on file prior to visit.    Allergies  Allergen Reactions  . Mushroom Extract Complex Anaphylaxis    Facial swelling  . Peanut-Containing Drug Products Anaphylaxis  .  Milk-Related Compounds Nausea And Vomiting  . Septra [Sulfamethoxazole-Trimethoprim] Hives    Social History   Social History  . Marital Status: Single    Spouse Name: N/A  . Number of Children: N/A  . Years of Education: N/A   Social History Main Topics  . Smoking status: Former Smoker -- 0.20 packs/day    Types: Cigarettes    Quit date: 05/24/2014  . Smokeless tobacco: Former Systems developer     Comment: also smoked hookah  . Alcohol Use: No  . Drug Use: No  . Sexual Activity: Yes    Birth Control/ Protection: None   Other Topics Concern  . None   Social History Narrative    Family History  Problem Relation Age of Onset  . Hypertension Mother   . Hyperlipidemia Mother   . Diabetes Mother   . Cancer Maternal Aunt     Breast  . Diabetes Maternal Aunt   . Cancer Maternal Grandfather     lung    ROS  Per HPI   Objective   BP 118/72 mmHg  Pulse 84  Temp(Src) 98.4 F (36.9 C) (Oral)  Ht 5' 4"  (1.626 m)  Wt 144 lb 14.4 oz (65.726 kg)  BMI 24.86 kg/m2  LMP 10/20/2015  Breastfeeding? No  General: Well appearing, no distress HEENT:   Head:  Normocephalic  Eyes: Pupils equal  and reactive to light/accomodation. Extraocular movements intact bilaterally.  Ears: Tympanic membranes normal bilaterally.  Nose/Throat: Nares patent with edematous turbinates bilaterally. Oropharnx clear and moist.  Neck: No cervical adenopathy bilaterally Respiratory/Chest: Clear to auscultation bilaterally. Unlabored work of breathing. No wheezing or rales. Cardiovascular: Regular rate and rhythm. Normal S1 and S2. No heart murmurs present. No extra heart sounds Gastrointestinal: Soft, non-tender, non-distended, no guarding, no rebound, no masses felt Genitourinary: Not examined. Not due for a pap smear    Musculoskeletal: Normal bulk Neuro: Alert, oriented, reflexes equal bilaterally and 2+ Dermatologic: No obvious rashes Psychiatric: Full affect  Assessment and Plan    No orders of the  defined types were placed in this encounter.    Health Maintenance Due  Topic Date Due  . INFLUENZA VACCINE  02/22/2015   Labs:  Orders Placed This Encounter  Procedures  . CBC with Differential  . COMPLETE METABOLIC PANEL WITH GFR  . HIV antibody  . RPR  . POCT hemoglobin

## 2015-10-20 NOTE — Patient Instructions (Signed)
Thank you for coming to see me today. It was a pleasure. Today we talked about:   Physical: I will send your labs via mail.  Please make an appointment to see me in one year.  If you have any questions or concerns, please do not hesitate to call the office at [phone].  Sincerely,  Cordelia Poche, MD

## 2015-10-21 LAB — COMPLETE METABOLIC PANEL WITH GFR
ALBUMIN: 4.2 g/dL (ref 3.6–5.1)
ALT: 6 U/L (ref 6–29)
AST: 8 U/L — ABNORMAL LOW (ref 10–30)
Alkaline Phosphatase: 82 U/L (ref 33–115)
BUN: 7 mg/dL (ref 7–25)
CHLORIDE: 105 mmol/L (ref 98–110)
CO2: 25 mmol/L (ref 20–31)
Calcium: 9.2 mg/dL (ref 8.6–10.2)
Creat: 1 mg/dL (ref 0.50–1.10)
GFR, Est Non African American: 78 mL/min (ref >=60)
Glucose, Bld: 70 mg/dL (ref 65–99)
Potassium: 3.7 mmol/L (ref 3.5–5.3)
Sodium: 140 mmol/L (ref 135–146)
Total Bilirubin: 0.3 mg/dL (ref 0.2–1.2)
Total Protein: 7 g/dL (ref 6.1–8.1)

## 2015-10-21 LAB — HEMOGLOBIN A1C
HEMOGLOBIN A1C: 5.6 % (ref <5.7)
MEAN PLASMA GLUCOSE: 114 mg/dL

## 2015-10-21 LAB — HIV ANTIBODY (ROUTINE TESTING W REFLEX): HIV: NONREACTIVE

## 2015-10-21 LAB — RPR

## 2015-10-22 LAB — URINE CYTOLOGY ANCILLARY ONLY
CHLAMYDIA, DNA PROBE: NEGATIVE
NEISSERIA GONORRHEA: NEGATIVE

## 2015-10-25 ENCOUNTER — Encounter: Payer: Self-pay | Admitting: Family Medicine

## 2015-12-20 IMAGING — US US OB COMP LESS 14 WK
1 series · 14 of 28 positions shown · non-contrast
Comparison: None.

CLINICAL DATA: Abdominal pain and pregnancy. Gestational age by LMP
of 8 weeks 2 days.

EXAM:
OBSTETRIC <14 WK US AND TRANSVAGINAL OB US
TECHNIQUE: Both transabdominal and transvaginal ultrasound examinations were
performed for complete evaluation of the gestation as well as the
maternal uterus, adnexal regions, and pelvic cul-de-sac.
Transvaginal technique was performed to assess early pregnancy.

[Series 1: us ob comp less 14 wks · 14 of 47 slices shown]
[im 2/47]
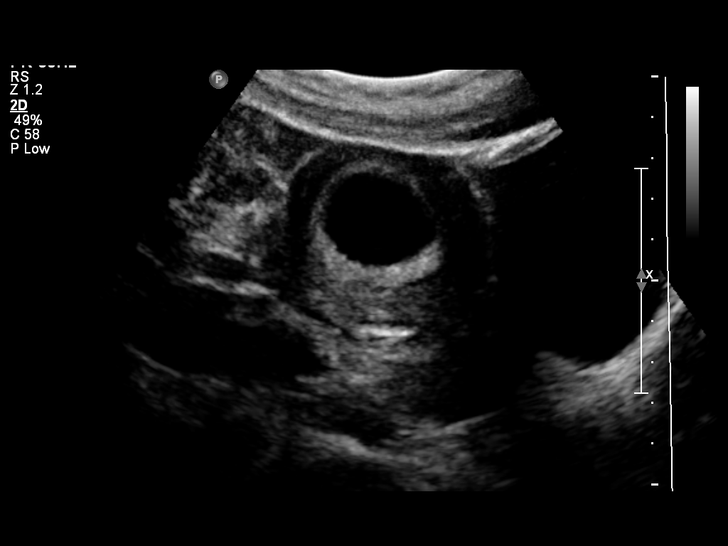
[im 6/47]
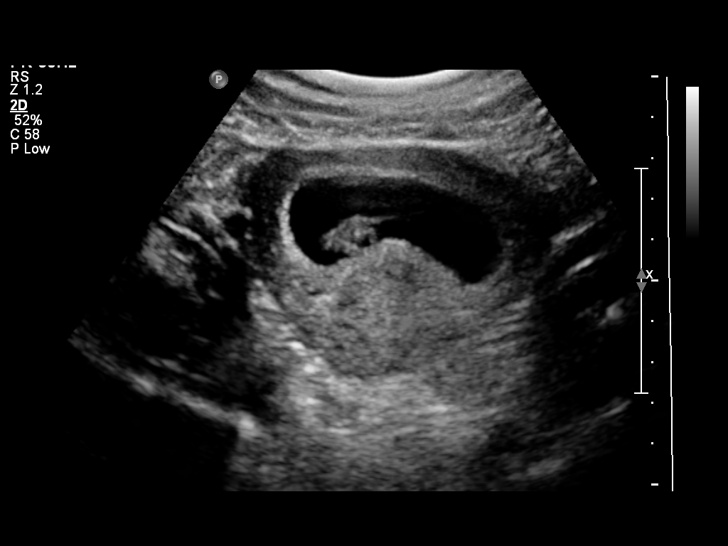
[im 9/47]
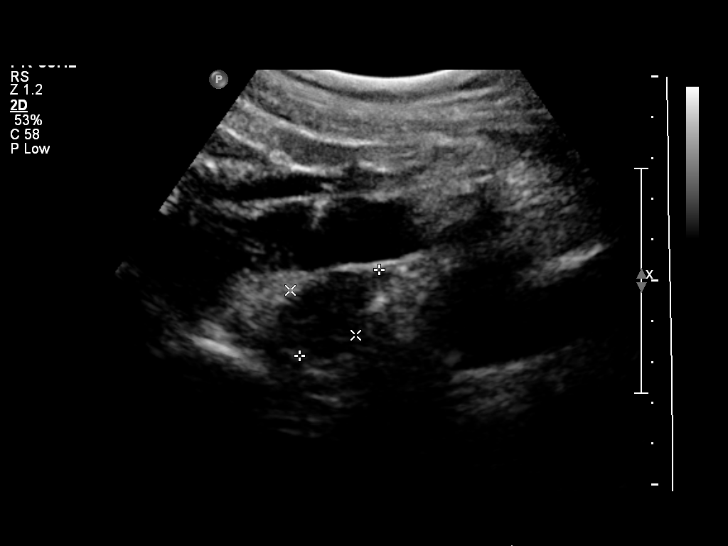
[im 12/47]
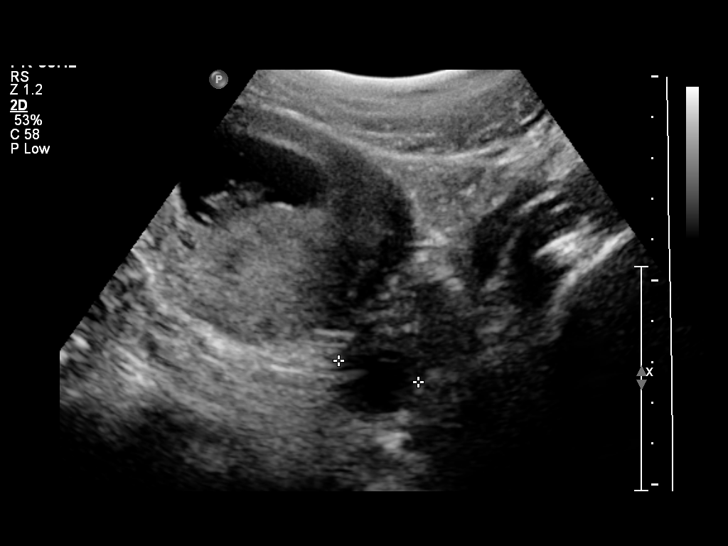
[im 16/47]
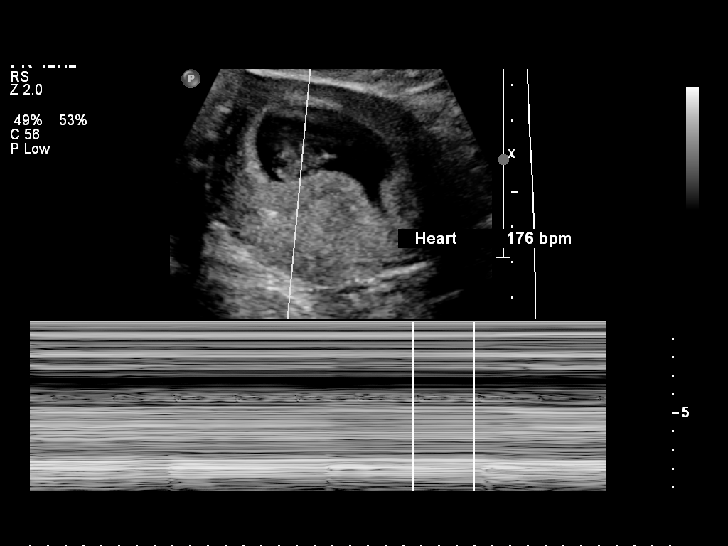
[im 19/47]
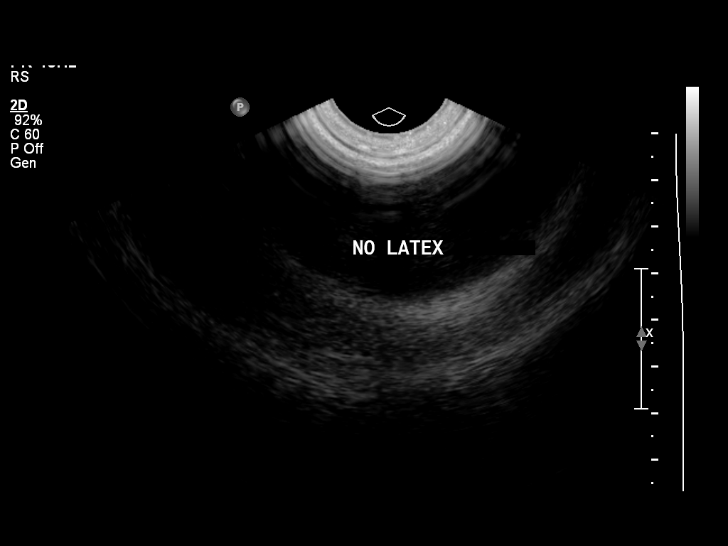
[im 23/47]
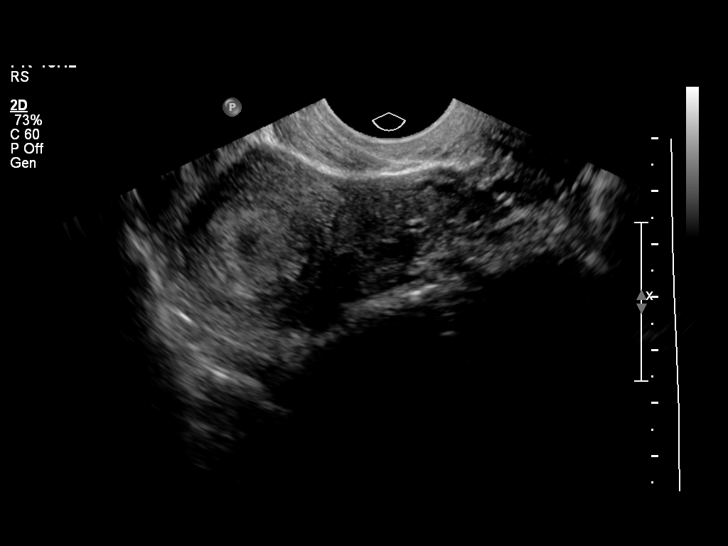
[im 26/47]
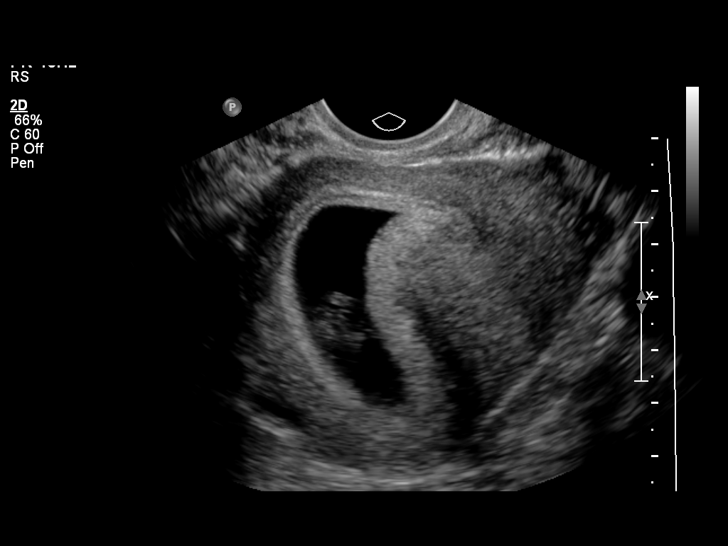
[im 29/47]
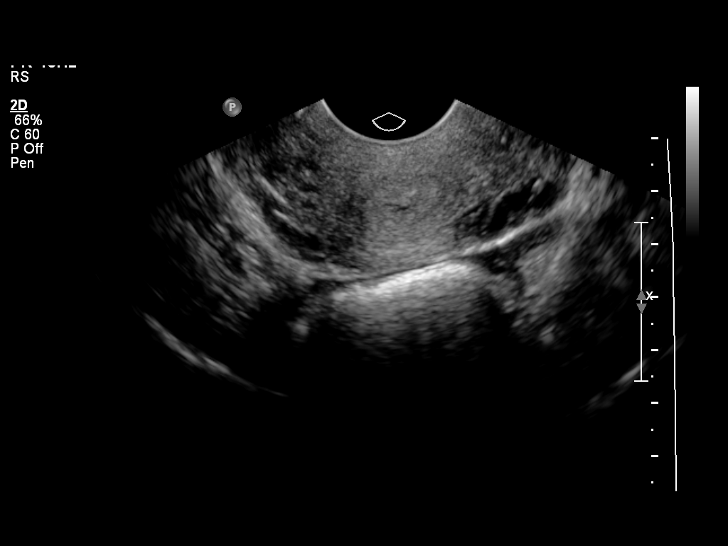
[im 33/47]
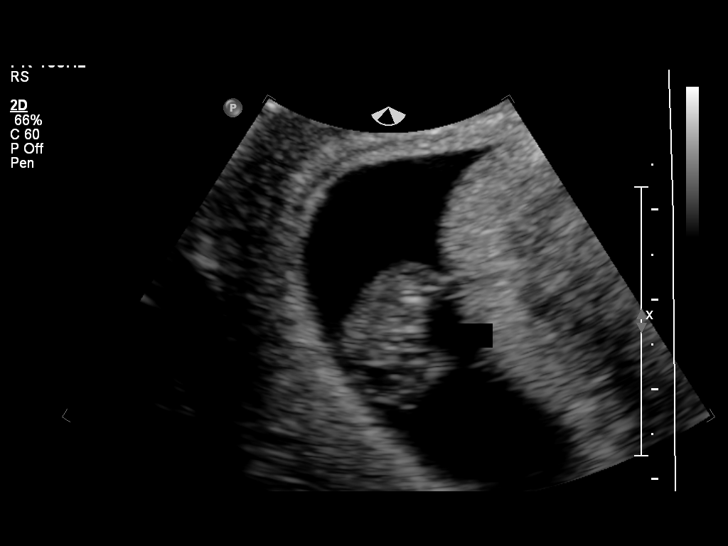
[im 36/47]
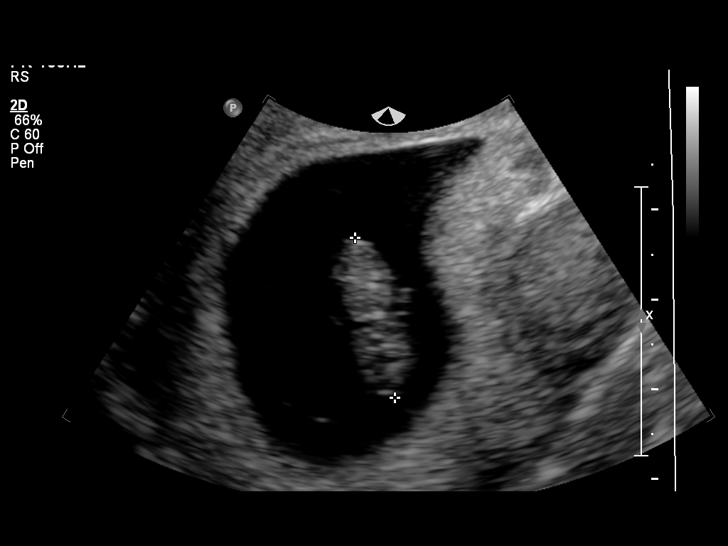
[im 40/47]
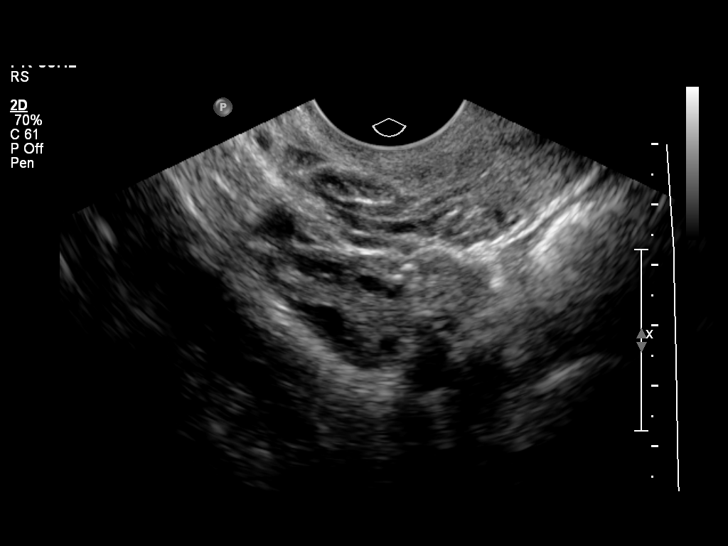
[im 43/47]
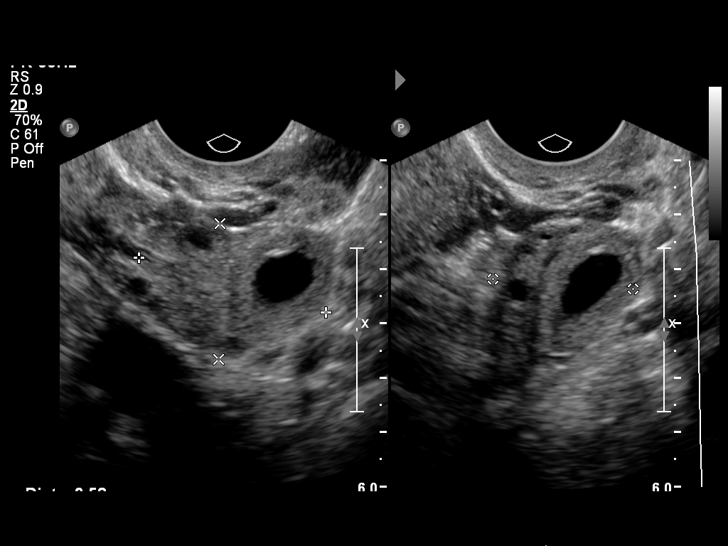
[im 47/47]
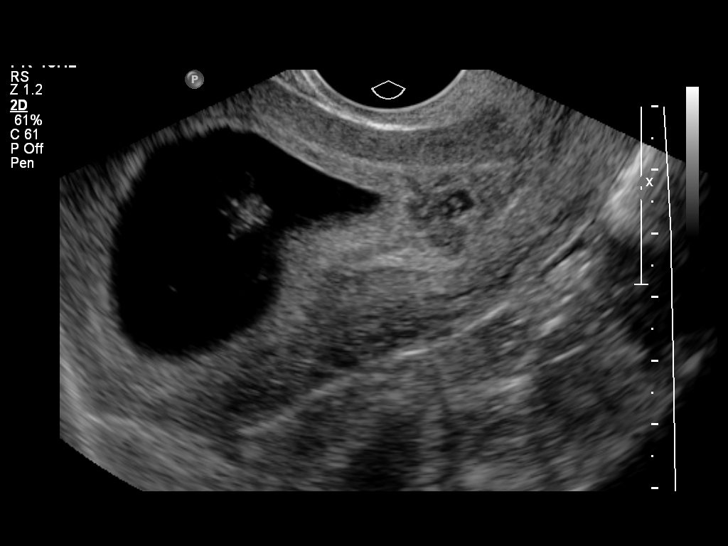

[14 of 28 positions shown; findings below may reference images not displayed]

FINDINGS: Intrauterine gestational sac: Visualized/normal in shape.

Yolk sac:  Visualized

Embryo:  Visualized

Cardiac Activity: Visualized

Heart Rate:  158 bpm

CRL:   18  mm   8 w 3 d                  US EDC: 01/31/2015

Maternal uterus/adnexae: Both ovaries are normal in appearance. No
adnexal mass or free fluid identified.
IMPRESSION: Single living IUP measuring 8 weeks 3 days with US EDC of
01/31/2015. This is concordant with LMP.

No significant maternal uterine or adnexal abnormality identified.

## 2016-07-14 ENCOUNTER — Telehealth: Payer: Self-pay | Admitting: Internal Medicine

## 2016-07-14 NOTE — Telephone Encounter (Signed)
Pt needs a copy of her last physical left up front for school. jw

## 2016-07-19 NOTE — Telephone Encounter (Signed)
Pt informed. Deseree Blount, CMA  

## 2016-07-19 NOTE — Telephone Encounter (Signed)
LM for pt to call the office. If she calls, please let her know a copy of her last physical if up front for her to pick up. Ottis Stain, CMA

## 2016-07-27 ENCOUNTER — Ambulatory Visit: Payer: Medicaid Other

## 2016-08-21 ENCOUNTER — Encounter (HOSPITAL_COMMUNITY): Payer: Self-pay | Admitting: Emergency Medicine

## 2016-08-21 ENCOUNTER — Ambulatory Visit (HOSPITAL_COMMUNITY)
Admission: EM | Admit: 2016-08-21 | Discharge: 2016-08-21 | Disposition: A | Discharge status: Home / Self Care | Payer: Medicaid Other | Attending: Emergency Medicine | Admitting: Emergency Medicine

## 2016-08-21 DIAGNOSIS — R21 Rash and other nonspecific skin eruption: Secondary | ICD-10-CM

## 2016-08-21 DIAGNOSIS — R1310 Dysphagia, unspecified: Secondary | ICD-10-CM

## 2016-08-21 DIAGNOSIS — R1319 Other dysphagia: Secondary | ICD-10-CM

## 2016-08-21 MED ORDER — TRIAMCINOLONE ACETONIDE 0.1 % EX CREA: 1.0000 "application " | TOPICAL_CREAM | Freq: Two times a day (BID) | CUTANEOUS | 0 refills | Status: DC

## 2016-08-21 NOTE — ED Provider Notes (Signed)
CSN: [id number]     Arrival date & time 08/21/16  1001 History   First MD Initiated Contact with Patient 08/21/16 1059     Chief Complaint  Patient presents with  . Swallowed Foreign Body  . Rash   (Consider location/radiation/quality/duration/timing/severity/associated sxs/prior Treatment) 26 year old female presents to clinic with chief complaint of difficulty swallowing and with a rash on her chest. She reports she swallowed a cotton ball four days ago and she states she feels as if there is something "stuck" in her chest. She has been able to eat and drink, having eaten foods such as hamburger, mashed potatoes, etc without difficulty. Rash appeared two days ago, it is painless but itchy, on her upper right chest. No new soaps, detergents, cloths, etc.      Past Medical History:  Diagnosis Date  . Chronic abdominal pain   . Depression    hx of, no current problems  . Headache   . Infection    UTI  . 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 Colposcopy in April, 2015 clinically normal. Recommend repeat her test Pap in one year.   . SVD (spontaneous vaginal delivery) 01/17/2015  . Vaginal Pap smear, abnormal    bx, f/u ok since   Past Surgical History:  Procedure Laterality Date  . BREAST FIBROADENOMA SURGERY     Family History  Problem Relation Age of Onset  . Hypertension Mother   . Hyperlipidemia Mother   . Diabetes Mother   . Cancer Maternal Aunt     Breast  . Diabetes Maternal Aunt   . Cancer Maternal Grandfather     lung   Social History  Substance Use Topics  . Smoking status: Former Smoker    Packs/day: 0.00    Types: Cigars    Quit date: 05/24/2014  . Smokeless tobacco: Current User     Comment: also smoked hookah  . Alcohol use No   OB History    Gravida Para Term Preterm AB Living   1 1 1     1    SAB TAB Ectopic Multiple Live Births         0 1     Review of Systems   Reason unable to perform ROS: as covered in HPI.  All other systems reviewed and are negative.   Allergies  Mushroom extract complex; Peanut-containing drug products; Milk-related compounds; and Septra [sulfamethoxazole-trimethoprim]  Home Medications   Prior to Admission medications   Medication Sig Start Date End Date Taking? Authorizing Provider  HYDROcodone-acetaminophen (NORCO/VICODIN) 5-325 MG tablet Take 1 tablet by mouth every 6 (six) hours as needed for moderate pain.   Yes Historical Provider, MD  penicillin v potassium (VEETID) 250 MG tablet Take 250 mg by mouth 4 (four) times daily.   Yes Historical Provider, MD  triamcinolone cream (KENALOG) 0.1 % Apply 1 application topically 2 (two) times daily. 08/21/16   Barnet Glasgow, NP   Meds Ordered and Administered this Visit  Medications - No data to display  BP 124/74 (BP Location: Right Arm)   Pulse 85   Temp 98.6 F (37 C) (Oral)   Resp 16   SpO2 100%  No data found.   Physical Exam  Constitutional: She is oriented to person, place, and time. She appears well-developed and well-nourished. No distress.  HENT:  Head: Normocephalic.  Nose: Nose normal.  Mouth/Throat: Oropharynx is clear and moist. No oropharyngeal exudate.  Cardiovascular:  Normal rate and regular rhythm.   Pulmonary/Chest: Effort normal and breath sounds normal.  Abdominal: Soft. Bowel sounds are normal. She exhibits no distension. There is no tenderness.  Neurological: She is alert and oriented to person, place, and time.  Skin: Skin is warm and dry. Capillary refill takes less than 2 seconds. She is not diaphoretic.  Psychiatric: She has a normal mood and affect.  Nursing note and vitals reviewed.   Urgent Care Course     Procedures (including critical care time)  Labs Review Labs Reviewed - No data to display  Imaging Review No results found.   Visual Acuity Review  Right Eye Distance:   Left Eye Distance:   Bilateral Distance:     Right Eye Near:   Left Eye Near:    Bilateral Near:         MDM   1. Rash   2. Esophageal dysphagia   For your rash, I have sent a prescription to your pharmacy for triamcinolone cream. Apply twice a day to the affected area. With regard to your difficulty swallowing, it is unlikely the cotton ball has been retrained in your esophagus since you have been able to eat and drink. Most likely you have some irritation to the lining of the esophagus due to the passing of the ball. I would recommend you follow up with a GI doctor for further evaluation should your symptoms persist more than a week. I have attached a handout that can help with a diet and food choices should it be needed. If your rash persists, follow up with your primary car provider or return to clinic.    Barnet Glasgow, NP 08/21/16 1121

## 2016-08-21 NOTE — Discharge Instructions (Signed)
For your rash, I have sent a prescription to your pharmacy for triamcinolone cream. Apply twice a day to the affected area. With regard to your difficulty swallowing, it is unlikely the cotton ball has been retrained in your esophagus since you have been able to eat and drink. Most likely you have some irritation to the lining of the esophagus due to the passing of the ball. I would recommend you follow up with a GI doctor for further evaluation should your symptoms persist more than a week. I have attached a handout that can help with a diet and food choices should it be needed. If your rash persists, follow up with your primary car provider or return to clinic.

## 2016-08-21 NOTE — ED Triage Notes (Signed)
The patient presented to the [facility] with a complaint of swallowing a cotton ball 4 days ago. The patient was able to speak and in no apparent respiratory distress.  The patient also complained of a rash on her chest that started today.

## 2016-10-21 ENCOUNTER — Encounter (HOSPITAL_COMMUNITY): Payer: Self-pay | Admitting: Emergency Medicine

## 2016-10-21 ENCOUNTER — Emergency Department (HOSPITAL_COMMUNITY)
Admission: EM | Admit: 2016-10-21 | Discharge: 2016-10-21 | Disposition: A | Discharge status: Home / Self Care | Payer: BLUE CROSS/BLUE SHIELD | Attending: Emergency Medicine | Admitting: Emergency Medicine

## 2016-10-21 ENCOUNTER — Emergency Department (HOSPITAL_COMMUNITY): Payer: BLUE CROSS/BLUE SHIELD

## 2016-10-21 DIAGNOSIS — J40 Bronchitis, not specified as acute or chronic: Secondary | ICD-10-CM | POA: Diagnosis not present

## 2016-10-21 DIAGNOSIS — Z87891 Personal history of nicotine dependence: Secondary | ICD-10-CM | POA: Diagnosis not present

## 2016-10-21 DIAGNOSIS — J111 Influenza due to unidentified influenza virus with other respiratory manifestations: Secondary | ICD-10-CM | POA: Diagnosis not present

## 2016-10-21 DIAGNOSIS — Z91010 Allergy to peanuts: Secondary | ICD-10-CM | POA: Insufficient documentation

## 2016-10-21 DIAGNOSIS — R69 Illness, unspecified: Secondary | ICD-10-CM

## 2016-10-21 DIAGNOSIS — R0789 Other chest pain: Secondary | ICD-10-CM | POA: Diagnosis present

## 2016-10-21 LAB — BASIC METABOLIC PANEL
Anion gap: 8 (ref 5–15)
BUN: 7 mg/dL (ref 6–20)
CO2: 24 mmol/L (ref 22–32)
Calcium: 8 mg/dL — ABNORMAL LOW (ref 8.9–10.3)
Chloride: 103 mmol/L (ref 101–111)
Creatinine, Ser: 0.7 mg/dL (ref 0.44–1.00)
GFR calc Af Amer: >60 mL/min (ref >60)
GFR calc non Af Amer: >60 mL/min (ref >60)
GLUCOSE: 89 mg/dL (ref 65–99)
Potassium: 3.2 mmol/L — ABNORMAL LOW (ref 3.5–5.1)
SODIUM: 135 mmol/L (ref 135–145)

## 2016-10-21 LAB — CBC WITH DIFFERENTIAL/PLATELET
Basophils Absolute: 0 10*3/uL (ref 0.0–0.1)
Basophils Relative: 0 %
EOS ABS: 0.1 10*3/uL (ref 0.0–0.7)
EOS PCT: 2 %
HCT: 32.5 % — ABNORMAL LOW (ref 36.0–46.0)
Hemoglobin: 10.6 g/dL — ABNORMAL LOW (ref 12.0–15.0)
LYMPHS ABS: 2.4 10*3/uL (ref 0.7–4.0)
Lymphocytes Relative: 33 %
MCH: 26.6 pg (ref 26.0–34.0)
MCHC: 32.6 g/dL (ref 30.0–36.0)
MCV: 81.7 fL (ref 78.0–100.0)
MONOS PCT: 7 %
Monocytes Absolute: 0.5 10*3/uL (ref 0.1–1.0)
Neutro Abs: 4.2 10*3/uL (ref 1.7–7.7)
Neutrophils Relative %: 58 %
PLATELETS: 225 10*3/uL (ref 150–400)
RBC: 3.98 MIL/uL (ref 3.87–5.11)
RDW: 14 % (ref 11.5–15.5)
WBC: 7.2 10*3/uL (ref 4.0–10.5)

## 2016-10-21 LAB — I-STAT BETA HCG BLOOD, ED (MC, WL, AP ONLY): I-stat hCG, quantitative: <5 m[IU]/mL (ref <5)

## 2016-10-21 LAB — I-STAT TROPONIN, ED: TROPONIN I, POC: 0 ng/mL (ref 0.00–0.08)

## 2016-10-21 LAB — I-STAT CG4 LACTIC ACID, ED: LACTIC ACID, VENOUS: 0.65 mmol/L (ref 0.5–1.9)

## 2016-10-21 MED ORDER — DOXYCYCLINE HYCLATE 100 MG PO TABS
100.0000 mg | ORAL_TABLET | Freq: Once | ORAL | Status: AC
  Administered 2016-10-21: 100 mg via ORAL
  Filled 2016-10-21: qty 1

## 2016-10-21 MED ORDER — POTASSIUM CHLORIDE CRYS ER 20 MEQ PO TBCR
40.0000 meq | EXTENDED_RELEASE_TABLET | Freq: Once | ORAL | Status: AC
  Administered 2016-10-21: 40 meq via ORAL
  Filled 2016-10-21: qty 2

## 2016-10-21 MED ORDER — SODIUM CHLORIDE 0.9 % IV BOLUS (SEPSIS)
1000.0000 mL | Freq: Once | INTRAVENOUS | Status: AC
  Administered 2016-10-21: 1000 mL via INTRAVENOUS

## 2016-10-21 MED ORDER — DOXYCYCLINE HYCLATE 100 MG PO CAPS: 100.0000 mg | ORAL_CAPSULE | Freq: Two times a day (BID) | ORAL | 0 refills | Status: AC

## 2016-10-21 MED ORDER — ACETAMINOPHEN 500 MG PO TABS
1000.0000 mg | ORAL_TABLET | Freq: Once | ORAL | Status: AC
  Administered 2016-10-21: 1000 mg via ORAL
  Filled 2016-10-21: qty 2

## 2016-10-21 MED ORDER — HYDROCODONE-HOMATROPINE 5-1.5 MG/5ML PO SYRP: 5.0000 mL | ORAL_SOLUTION | Freq: Four times a day (QID) | ORAL | 0 refills | Status: DC | PRN

## 2016-10-21 MED ORDER — KETOROLAC TROMETHAMINE 15 MG/ML IJ SOLN
15.0000 mg | Freq: Once | INTRAMUSCULAR | Status: AC
  Administered 2016-10-21: 15 mg via INTRAVENOUS
  Filled 2016-10-21: qty 1

## 2016-10-21 MED ORDER — OSELTAMIVIR PHOSPHATE 75 MG PO CAPS: 75.0000 mg | ORAL_CAPSULE | Freq: Two times a day (BID) | ORAL | 0 refills | Status: DC

## 2016-10-21 NOTE — ED Provider Notes (Signed)
[place] DEPT Provider Note   CSN: [id number] Arrival date & time: 10/21/16  1701     History   Chief Complaint Chief Complaint  Patient presents with  . Chest Pain    HPI Kathryn Fox is a 26 y.o. female.  HPI   26 year old female with past medical history as below here with cough. Patient states her symptoms started approximately 3 days ago. She had acute onset of body aches, cough, chills, and fatigue. Over the last several days, she has had progressively worsening fevers up to 102, cough, and sputum production. She is also had decreased appetite. She has known sick contacts in her daughter with similar symptoms. She has not taken anything for it. She also has a mild, aching, chest pressure and pain that is worse after coughing. It only occurs with coughing and is not present at rest. No leg swelling. No h/o DVT.  Past Medical History:  Diagnosis Date  . Chronic abdominal pain   . Depression    hx of, no current problems  . Headache   . Infection    UTI  . 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 Colposcopy in April, 2015 clinically normal. Recommend repeat her test Pap in one year.   . SVD (spontaneous vaginal delivery) 01/17/2015  . Vaginal Pap smear, abnormal    bx, f/u ok since    Patient Active Problem List   Diagnosis Date Noted  . 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 Depression 05/29/2013  . Neck pain, chronic 02/25/2011  . Migraine without aura 09/22/2010    Past Surgical History:  Procedure Laterality Date  . BREAST FIBROADENOMA SURGERY      OB History    Gravida Para Term Preterm AB Living   1 1 1     1    SAB TAB Ectopic Multiple Live Births         0 1       Home Medications    Prior to Admission medications   Medication Sig Start Date End Date Taking? Authorizing Provider    doxycycline (VIBRAMYCIN) 100 MG capsule Take 1 capsule (100 mg total) by mouth 2 (two) times daily. 10/21/16 10/28/16  Duffy Bruce, MD  HYDROcodone-acetaminophen (NORCO/VICODIN) 5-325 MG tablet Take 1 tablet by mouth every 6 (six) hours as needed for moderate pain.    Historical Provider, MD  HYDROcodone-homatropine (HYCODAN) 5-1.5 MG/5ML syrup Take 5 mLs by mouth every 6 (six) hours as needed for cough. 10/21/16   Duffy Bruce, MD  oseltamivir (TAMIFLU) 75 MG capsule Take 1 capsule (75 mg total) by mouth every 12 (twelve) hours. 10/21/16   Duffy Bruce, MD  penicillin v potassium (VEETID) 250 MG tablet Take 250 mg by mouth 4 (four) times daily.    Historical Provider, MD  triamcinolone cream (KENALOG) 0.1 % Apply 1 application topically 2 (two) times daily. 08/21/16   Barnet Glasgow, NP    Family History Family History  Problem Relation Age of Onset  . Cancer Maternal Aunt     Breast  . Diabetes Maternal Aunt   . Cancer Maternal Grandfather     lung  . Hypertension Mother   . Hyperlipidemia Mother   . Diabetes Mother     Social History Social History  Substance Use Topics  . Smoking status: Former Smoker    Packs/day: 0.00    Types:  Cigars    Quit date: 05/24/2014  . Smokeless tobacco: Current User     Comment: also smoked hookah  . Alcohol use No     Allergies   Mushroom extract complex; Peanut-containing drug products; Milk-related compounds; and Septra [sulfamethoxazole-trimethoprim]   Review of Systems Review of Systems  Constitutional: Positive for chills and fatigue. Negative for fever.  HENT: Positive for congestion and sore throat. Negative for rhinorrhea.   Eyes: Negative for visual disturbance.  Respiratory: Positive for cough. Negative for shortness of breath and wheezing.   Cardiovascular: Negative for chest pain and leg swelling.  Gastrointestinal: Negative for abdominal pain, diarrhea, nausea and vomiting.  Genitourinary: Negative for dysuria, flank  pain, vaginal bleeding and vaginal discharge.  Musculoskeletal: Positive for arthralgias and myalgias. Negative for neck pain.  Skin: Negative for rash.  Allergic/Immunologic: Negative for immunocompromised state.  Neurological: Negative for syncope and headaches.  Hematological: Does not bruise/bleed easily.  All other systems reviewed and are negative.    Physical Exam Updated Vital Signs BP 100/67   Pulse 84   Temp 99.3 F (37.4 C) (Oral)   Resp 16   Ht 5\' 4"  (1.626 m)   Wt 144 lb (65.3 kg)   LMP 09/20/2016   SpO2 98%   BMI 24.72 kg/m   Physical Exam  Constitutional: She is oriented to person, place, and time. She appears well-developed and well-nourished. No distress.  HENT:  Head: Normocephalic and atraumatic.  Mouth/Throat: Oropharynx is clear and moist.  Mild posterior pharyngeal erythema without tonsillar exudates  Eyes: Conjunctivae are normal.  Neck: Neck supple.  Cardiovascular: Normal rate, regular rhythm and normal heart sounds.  Exam reveals no friction rub.   No murmur heard. Pulmonary/Chest: Effort normal. No tachypnea. No respiratory distress. She has no wheezes. She has rhonchi in the right middle field, the right lower field, the left middle field and the left lower field. She has no rales.  Abdominal: She exhibits no distension.  Musculoskeletal: She exhibits no edema.  Neurological: She is alert and oriented to person, place, and time. She exhibits normal muscle tone.  Skin: Skin is warm. Capillary refill takes less than 2 seconds.  Psychiatric: She has a normal mood and affect.  Nursing note and vitals reviewed.    ED Treatments / Results  Labs (all labs ordered are listed, but only abnormal results are displayed) Labs Reviewed  CBC WITH DIFFERENTIAL/PLATELET - Abnormal; Notable for the following:       Result Value   Hemoglobin 10.6 (*)    HCT 32.5 (*)    All other components within normal limits  BASIC METABOLIC PANEL - Abnormal; Notable  for the following:    Potassium 3.2 (*)    Calcium 8.0 (*)    All other components within normal limits  I-STAT CG4 LACTIC ACID, ED  I-STAT BETA HCG BLOOD, ED ([facility], [facility], [facility] ONLY)  I-STAT TROPOININ, ED    EKG  EKG Interpretation  Date/Time:  Saturday October 21 2016 17:13:06 EDT Ventricular Rate:  91 PR Interval:    QRS Duration: 80 QT Interval:  338 QTC Calculation: 416 R Axis:   71 Text Interpretation:  Sinus rhythm RSR' in V1 or V2, probably normal variant No significant change since last tracing Confirmed by Nichoals Heyde MD, Lysbeth Galas [phone]) on 10/21/2016 5:27:37 PM       Radiology Dg Chest 2 View  Result Date: 10/21/2016 CLINICAL DATA:  Reason for exam: CP and coughing with low-grade fever since last week. No other  medical hx provided. EXAM: CHEST  2 VIEW COMPARISON:  02/19/2014 FINDINGS: The heart size and mediastinal contours are within normal limits. Both lungs are clear. No pleural effusion or pneumothorax. The visualized skeletal structures are unremarkable. IMPRESSION: No active cardiopulmonary disease. Electronically Signed   By: Lajean Manes M.D.   On: 10/21/2016 17:59    Procedures Procedures (including critical care time)  Medications Ordered in ED Medications  sodium chloride 0.9 % bolus 1,000 mL (0 mLs Intravenous Stopped 10/21/16 1842)  doxycycline (VIBRA-TABS) tablet 100 mg (100 mg Oral Given 10/21/16 1814)  sodium chloride 0.9 % bolus 1,000 mL (0 mLs Intravenous Stopped 10/21/16 2016)  ketorolac (TORADOL) 15 MG/ML injection 15 mg (15 mg Intravenous Given 10/21/16 1814)  acetaminophen (TYLENOL) tablet 1,000 mg (1,000 mg Oral Given 10/21/16 1814)  potassium chloride SA (K-DUR,KLOR-CON) CR tablet 40 mEq (40 mEq Oral Given 10/21/16 2025)     Initial Impression / Assessment and Plan / ED Course  I have reviewed the triage vital signs and the nursing notes.  Pertinent labs & imaging results that were available during my care of the patient were reviewed by me and considered  in my medical decision making (see chart for details).     26 year old female here with cough, fever, body aches, and setting of known sick contact in her daughter. On arrival, vital signs are remarkable for low-grade tachycardia and mild fever. No hypoxia or increased respiratory rate. Lab work obtained and is overall reassuring. She is mildly hypokalemic which has been replaced. She has a normal lactic acid and no evidence of sepsis. Chest x-ray without focal pneumonia. She feels improved after IV fluids. I suspect she has viral URI versus early pneumonia versus influenza. Her daughter, who is with her in the room, has similar symptoms. Her heart rate is otherwise less than 100, she is not on OCPs, has no leg swelling, and heart rate less than 100 after fever control, and I do not suspect DVT or PE in the setting of fevers and known sick contact. Will treat her with doxycycline, antitussives, and discharged home. Discussed risks and benefits of Tamiflu as well.  Final Clinical Impressions(s) / ED Diagnoses   Final diagnoses:  Influenza-like illness  Bronchitis    New Prescriptions Discharge Medication List as of 10/21/2016  8:09 PM    START taking these medications   Details  doxycycline (VIBRAMYCIN) 100 MG capsule Take 1 capsule (100 mg total) by mouth 2 (two) times daily., Starting Sat 10/21/2016, Until Sat 10/28/2016, Print    HYDROcodone-homatropine (HYCODAN) 5-1.5 MG/5ML syrup Take 5 mLs by mouth every 6 (six) hours as needed for cough., Starting Sat 10/21/2016, Print    oseltamivir (TAMIFLU) 75 MG capsule Take 1 capsule (75 mg total) by mouth every 12 (twelve) hours., Starting Sat 10/21/2016, Print         Duffy Bruce, MD 10/22/16 [phone]

## 2016-10-21 NOTE — ED Notes (Signed)
Patient ambulated in the hallway.  Patient had no complaints during ambulation.

## 2016-10-21 NOTE — ED Notes (Signed)
Pt verbalized understanding discharge instructions and denies any further needs or questions at this time. VS stable, ambulatory and steady gait.   

## 2016-10-21 NOTE — ED Triage Notes (Signed)
Per Pt: c/o CP cough and SOB x 4 days. Called PCP today and told her to come b/c they think she might have pneumonia.    A&Ox4  VSS

## 2017-01-02 ENCOUNTER — Other Ambulatory Visit (HOSPITAL_COMMUNITY)
Admission: RE | Admit: 2017-01-02 | Discharge: 2017-01-02 | Disposition: A | Discharge status: Home / Self Care | Payer: BLUE CROSS/BLUE SHIELD | Source: Ambulatory Visit | Attending: Family Medicine | Admitting: Family Medicine

## 2017-01-02 ENCOUNTER — Ambulatory Visit (INDEPENDENT_AMBULATORY_CARE_PROVIDER_SITE_OTHER): Payer: BLUE CROSS/BLUE SHIELD | Admitting: Internal Medicine

## 2017-01-02 ENCOUNTER — Encounter: Payer: Self-pay | Admitting: Internal Medicine

## 2017-01-02 VITALS — BP 102/60 | HR 89 | Temp 98.3°F | Ht 64.0 in | Wt 151.2 lb

## 2017-01-02 DIAGNOSIS — Z124 Encounter for screening for malignant neoplasm of cervix: Secondary | ICD-10-CM

## 2017-01-02 DIAGNOSIS — Z202 Contact with and (suspected) exposure to infections with a predominantly sexual mode of transmission: Secondary | ICD-10-CM | POA: Diagnosis present

## 2017-01-02 DIAGNOSIS — B009 Herpesviral infection, unspecified: Secondary | ICD-10-CM | POA: Diagnosis present

## 2017-01-02 DIAGNOSIS — R0602 Shortness of breath: Secondary | ICD-10-CM | POA: Diagnosis not present

## 2017-01-02 LAB — POCT WET PREP (WET MOUNT)
CLUE CELLS WET PREP WHIFF POC: NEGATIVE
TRICHOMONAS WET PREP HPF POC: ABSENT

## 2017-01-02 NOTE — Assessment & Plan Note (Signed)
Differential remains broad at this point. Potentially restrictive lung disease pattern related to large breast size. History of smoking may also compromise lung function. Sleep apnea remains on differential due to history of awakening from sleep with SOB. However, less likely given normal BMI and normal neck girth as well as patient does not believe that she snores. Postnasal drip from allergic rhinitis may be contributing although would not expect this degree of SOB as reported. GERD is also on differential but patient not having any typical GERD symptoms. Lung exam was benign and patient with appropriate pulse ox saturation. No signs of volume overload on exam.  -referred to pharmacy for PFTs -would consider sleep study if PFTs negative  -patient would likely benefit from allergic rhinitis regimen, will discuss at next visit

## 2017-01-02 NOTE — Progress Notes (Signed)
Subjective:    Kathryn Fox - 26 y.o. female MRN [id number]  Date of birth: 06-Jul-1991  HPI  Kathryn Fox is here for annual exam.  Concerns today: Trouble breathing  Periods: Regular  Contraception: Condoms, considering hormonal patch  Pelvic symptoms: White vaginal discharge that is asymptomatic   Sexual activity: Active with one partner  STD Screening: Asks for STD screening. Reports h/o HSV and asks to be tested.  Pap smear status: Needs. 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 Subsequently underwent colposcopy without endocervical curettage which was negative.   Trouble Breathing: Reports several month history of progressive worsening of SOB. Reports SOB typically occurs as she is lying flat trying to sleep. It also sometimes awakens her from sleep as well. Sleeping on her stomach often helps improve her ability to breathe normally. She denies history of asthma. She has an 8 year smoking history, quit >2 years ago. She denies daytime or nighttime cough. Does have allergies. She sleeps with her 2 year child so she is unsure if she snores. Lives at home with her sister who has not mentioned hearing loud snoring through the walls. Denies metallic taste in mouth, burning in throat, nausea, regurgitation of food, belching. No lower extremity edema.   ROS: See HPI  Health Maintenance Due  Topic Date Due  . PAP SMEAR  08/22/2016    -  reports that she quit smoking about 2 years ago. Her smoking use included Cigars. She smoked 0.00 packs per day. She uses smokeless tobacco.  - Past Medical History: Patient Active Problem List   Diagnosis Date Noted  . 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 Depression 05/29/2013  . Neck pain, chronic 02/25/2011  . Migraine without aura 09/22/2010   - Medications: reviewed and updated   Objective:   Physical Exam BP 102/60   Pulse 89   Temp 98.3 F (36.8 C) (Oral)    Ht 5\' 4"  (1.626 m)   Wt 151 lb 3.2 oz (68.6 kg)   LMP 12/17/2016 (Approximate)   SpO2 98%   BMI 25.95 kg/m  Gen: NAD, alert, cooperative with exam, well-appearing HEENT: NCAT, PERRL, clear conjunctiva, oropharynx clear, normal neck girth  Chest: Large diameter of thorax due to large breast size.  CV: RRR, good S1/S2, no murmur, no edema, no JVD  Resp: CTABL, no wheezes, non-labored, no crackles  Psych: good insight, alert and oriented GU/GYN: Exam performed in the presence of a chaperone. External genitalia within normal limits and without lesions.  Vaginal mucosa pink, moist, normal rugae.  Nonfriable cervix without lesions or bleeding noted on speculum exam.  Mild amount of thin white discharge present. Bimanual exam revealed normal, nongravid uterus.  No cervical motion tenderness. No adnexal masses bilaterally.      Assessment & Plan:   1. Herpes simplex History of HSV per patient, requesting testing. No lesions for several years.  - HSV 1 antibody, IgG - HSV 2 antibody, IgG  2. Exposure to sexually transmitted disease (STD) Patient requests STD testing. Vaginal discharge appears physiologic in nature. Wet prep with few bacteria.  - HIV antibody (with reflex) - RPR - Cervicovaginal ancillary only - POCT Wet Prep [employer])  3. Screening for malignant neoplasm of cervix History of LSIL with subsequent negative colposcopy in 2015.  - Cytology - PAP [place]  4. Shortness of breath Differential remains broad at this point. Potentially restrictive lung disease pattern related  to large breast size. History of smoking may also compromise lung function. Sleep apnea remains on differential due to history of awakening from sleep with SOB. However, less likely given normal BMI and normal neck girth as well as patient does not believe that she snores. Postnasal drip from allergic rhinitis may be contributing although would not expect this degree of SOB as reported. GERD is also on  differential but patient not having any typical GERD symptoms. Lung exam was benign and patient with appropriate pulse ox saturation. No signs of volume overload on exam.  -referred to pharmacy for PFTs -would consider sleep study if PFTs negative  -patient would likely benefit from allergic rhinitis regimen, will discuss at next visit    Phill Myron, D.O. 01/02/2017, 11:47 AM PGY-2, [place]

## 2017-01-02 NOTE — Patient Instructions (Addendum)
Please schedule to see Dr. Valentina Lucks, pharmacy, for pulmonary function testing. Concerned that some of your symptoms may be related to a restrictive lung pattern from larger breasts.   We will call you with your test results from today.    Think about the various birth control options and let me know if you decide you want to use something additional.    Contraception Choices Contraception, also called birth control, means things to use or ways to try not to get pregnant. Hormonal birth control This kind of birth control uses hormones. Here are some types of hormonal birth control:  A tube that is put under skin of the arm (implant). The tube can stay in for as long as 3 years.  Shots to get every 3 months (injections).  Pills to take every day (birth control pills).  A patch to change 1 time each week for 3 weeks (birth control patch). After that, the patch is taken off for 1 week.  A ring to put in the vagina. The ring is left in for 3 weeks. Then it is taken out of the vagina for 1 week. Then a new ring is put in.  Pills to take after unprotected sex (emergency birth control pills).  Barrier birth control Here are some types of barrier birth control:  A thin covering that is put on the penis before sex (female condom). The covering is thrown away after sex.  A soft, loose covering that is put in the vagina before sex (female condom). The covering is thrown away after sex.  A rubber bowl that sits over the cervix (diaphragm). The bowl must be made for you. The bowl is put into the vagina before sex. The bowl is left in for 6-8 hours after sex. It is taken out within 24 hours.  A small, soft cup that fits over the cervix (cervical cap). The cup must be made for you. The cup can be left in for 6-8 hours after sex. It is taken out within 48 hours.  A sponge that is put into the vagina before sex. It must be left in for at least 6 hours after sex. It must be taken out within 30 hours.  Then it is thrown away.  A chemical that kills or stops sperm from getting into the uterus (spermicide). It may be a pill, cream, jelly, or foam to put in the vagina. The chemical should be used at least 10-15 minutes before sex.  IUD (intrauterine) birth control An IUD is a small, T-shaped piece of plastic. It is put inside the uterus. There are two kinds:  Hormone IUD. This kind can stay in for 3-5 years.  Copper IUD. This kind can stay in for 10 years.  Permanent birth control Here are some types of permanent birth control:  Surgery to block the fallopian tubes.  Having an insert put into each fallopian tube.  Surgery to tie off the tubes that carry sperm (vasectomy).  Natural planning birth control Here are some types of natural planning birth control:  Not having sex on the days the woman could get pregnant.  Using a calendar: ? To keep track of the length of each period. ? To find out what days pregnancy can happen. ? To plan to not have sex on days when pregnancy can happen.  Watching for symptoms of ovulation and not having sex during ovulation. One way the woman can check for ovulation is to check her temperature.  Waiting to have  sex until after ovulation.  Summary  Contraception, also called birth control, means things to use or ways to try not to get pregnant.  Hormonal methods of birth control include implants, injections, pills, patches, vaginal rings, and emergency birth control pills.  Barrier methods of birth control can include female condoms, female condoms, diaphragms, cervical caps, sponges, and spermicides.  There are two types of IUD (intrauterine device) birth control. An IUD can be put in a woman's uterus to prevent pregnancy for 3-5 years.  Permanent sterilization can be done through a procedure for males, females, or both.  Natural planning methods involve not having sex on the days when the woman could get pregnant. This information is not  intended to replace advice given to you by your health care provider. Make sure you discuss any questions you have with your health care provider. Document Released: 05/07/2009 Document Revised: 07/20/2016 Document Reviewed: 07/20/2016 Elsevier Interactive Patient Education  2017 [employer].

## 2017-01-03 LAB — RPR: RPR Ser Ql: NONREACTIVE

## 2017-01-03 LAB — HIV ANTIBODY (ROUTINE TESTING W REFLEX): HIV SCREEN 4TH GENERATION: NONREACTIVE

## 2017-01-03 LAB — HSV 2 ANTIBODY, IGG: HSV 2 Glycoprotein G Ab, IgG: 17.9 index — ABNORMAL HIGH (ref 0.00–0.90)

## 2017-01-03 LAB — HSV 1 ANTIBODY, IGG

## 2017-01-03 LAB — CERVICOVAGINAL ANCILLARY ONLY
Chlamydia: NEGATIVE
Neisseria Gonorrhea: NEGATIVE

## 2017-01-04 LAB — CYTOLOGY - PAP: DIAGNOSIS: NEGATIVE

## 2017-01-05 ENCOUNTER — Ambulatory Visit: Payer: BLUE CROSS/BLUE SHIELD | Admitting: Pharmacist

## 2017-01-09 ENCOUNTER — Encounter: Payer: Self-pay | Admitting: *Deleted

## 2017-01-11 ENCOUNTER — Encounter: Payer: Self-pay | Admitting: *Deleted

## 2017-04-18 ENCOUNTER — Telehealth: Payer: Self-pay | Admitting: Internal Medicine

## 2017-04-18 NOTE — Telephone Encounter (Signed)
Pt called and needs a letter printed out or her test results with all the STD's ect left up front for pick up. She will be her tomorrow for this 04/19/17. Blima Rich

## 2017-04-19 NOTE — Telephone Encounter (Signed)
Tried to contact pt to let her know that letter would be up front as well as one in her my chart.  Phone would not go through. Please inform her of this and let her know she may pick it up at her earliest convenience. Katharina Caper, Arbor Cohen D, [place]

## 2017-04-25 ENCOUNTER — Ambulatory Visit (INDEPENDENT_AMBULATORY_CARE_PROVIDER_SITE_OTHER): Payer: BLUE CROSS/BLUE SHIELD | Admitting: Family Medicine

## 2017-04-25 ENCOUNTER — Encounter: Payer: Self-pay | Admitting: Family Medicine

## 2017-04-25 VITALS — BP 104/60 | HR 81 | Temp 98.6°F | Ht 64.0 in | Wt 151.8 lb

## 2017-04-25 DIAGNOSIS — Z30016 Encounter for initial prescription of transdermal patch hormonal contraceptive device: Secondary | ICD-10-CM

## 2017-04-25 DIAGNOSIS — Z3200 Encounter for pregnancy test, result unknown: Secondary | ICD-10-CM | POA: Diagnosis not present

## 2017-04-25 DIAGNOSIS — Z720 Tobacco use: Secondary | ICD-10-CM

## 2017-04-25 DIAGNOSIS — Z309 Encounter for contraceptive management, unspecified: Secondary | ICD-10-CM | POA: Insufficient documentation

## 2017-04-25 DIAGNOSIS — Z87891 Personal history of nicotine dependence: Secondary | ICD-10-CM | POA: Insufficient documentation

## 2017-04-25 LAB — POCT URINE PREGNANCY: Preg Test, Ur: NEGATIVE

## 2017-04-25 MED ORDER — VARENICLINE TARTRATE 0.5 MG X 11 & 1 MG X 42 PO MISC: ORAL | 0 refills | Status: DC

## 2017-04-25 MED ORDER — NORELGESTROMIN-ETH ESTRADIOL 150-35 MCG/24HR TD PTWK: 1.0000 | MEDICATED_PATCH | TRANSDERMAL | 12 refills | Status: DC

## 2017-04-25 MED ORDER — VARENICLINE TARTRATE 1 MG PO TABS: 1.0000 mg | ORAL_TABLET | Freq: Two times a day (BID) | ORAL | 1 refills | Status: DC

## 2017-04-25 NOTE — Assessment & Plan Note (Signed)
All birth control options discussed with patient today. She is most interested in using the birth control patch. Prescriptions for this in to her pharmacy. Pregnancy test negative today.

## 2017-04-25 NOTE — Assessment & Plan Note (Signed)
Interested in quitting smoking. Discussed all options of cold [place], nicotine replacement, and Chantix. Patient is interested in starting Chantix. She is going to quit smoking tomorrow and start the Chantix. Prescription sent for a 12 week course.

## 2017-04-25 NOTE — Patient Instructions (Addendum)
Thank you for coming in today, it was so nice to see you! Today we talked about:    Birth control: I have given you a prescription for the birth control patch: On your 1st day of bleeding on your period, apply 1 patch (ethinyl estradiol 35 mcg/day and norelgestromin 150 mcg/day) topically each week for 3 weeks to clean, dry, intact skin on the buttock, abdomen, upper outer arm, or upper torso; week 4 is patch-free, withdrawal bleeding expected at this time; repeat cycle   Smoking: I have prescribed Chantix for you. If you'd like to read more information on it, I have attached some information for you below. It is a 12 week long medication  Please follow up as needed.   If you have any questions or concerns, please do not hesitate to call the office at [phone]. You can also message me directly via MyChart.   Sincerely,   Smitty Cords, MD   Varenicline oral tablets What is this medicine? VARENICLINE (var EN i kleen) is used to help people quit smoking. It can reduce the symptoms caused by stopping smoking. It is used with a patient support program recommended by your physician. This medicine may be used for other purposes; ask your health care provider or pharmacist if you have questions. COMMON BRAND NAME(S): Chantix What should I tell my health care provider before I take this medicine? They need to know if you have any of these conditions: -bipolar disorder, depression, schizophrenia or other mental illness -heart disease -if you often drink alcohol -kidney disease -peripheral vascular disease -seizures -stroke -suicidal thoughts, plans, or attempt; a previous suicide attempt by you or a family member -an unusual or allergic reaction to varenicline, other medicines, foods, dyes, or preservatives -pregnant or trying to get pregnant -breast-feeding How should I use this medicine? Take this medicine by mouth after eating. Take with a full glass of water. Follow the  directions on the prescription label. Take your doses at regular intervals. Do not take your medicine more often than directed. There are 3 ways you can use this medicine to help you quit smoking; talk to your health care professional to decide which plan is right for you: 1) you can choose a quit date and start this medicine 1 week before the quit date, or, 2) you can start taking this medicine before you choose a quit date, and then pick a quit date between day 8 and 35 days of treatment, or, 3) if you are not sure that you are able or willing to quit smoking right away, start taking this medicine and slowly decrease the amount you smoke as directed by your health care professional with the goal of being cigarette-free by week 12 of treatment. Stick to your plan; ask about support groups or other ways to help you remain cigarette-free. If you are motivated to quit smoking and did not succeed during a previous attempt with this medicine for reasons other than side effects, or if you returned to smoking after this treatment, speak with your health care professional about whether another course of this medicine may be right for you. A special MedGuide will be given to you by the pharmacist with each prescription and refill. Be sure to read this information carefully each time. Talk to your pediatrician regarding the use of this medicine in children. This medicine is not approved for use in children. Overdosage: If you think you have taken too much of this medicine contact a poison control center  or emergency room at once. NOTE: This medicine is only for you. Do not share this medicine with others. What if I miss a dose? If you miss a dose, take it as soon as you can. If it is almost time for your next dose, take only that dose. Do not take double or extra doses. What may interact with this medicine? -alcohol or any product that contains alcohol -insulin -other stop smoking  aids -theophylline -warfarin This list may not describe all possible interactions. Give your health care provider a list of all the medicines, herbs, non-prescription drugs, or dietary supplements you use. Also tell them if you smoke, drink alcohol, or use illegal drugs. Some items may interact with your medicine. What should I watch for while using this medicine? Visit your doctor or health care professional for regular check ups. Ask for ongoing advice and encouragement from your doctor or healthcare professional, friends, and family to help you quit. If you smoke while on this medication, quit again Your mouth may get dry. Chewing sugarless gum or sucking hard candy, and drinking plenty of water may help. Contact your doctor if the problem does not go away or is severe. You may get drowsy or dizzy. Do not drive, use machinery, or do anything that needs mental alertness until you know how this medicine affects you. Do not stand or sit up quickly, especially if you are an older patient. This reduces the risk of dizzy or fainting spells. Sleepwalking can happen during treatment with this medicine, and can sometimes lead to behavior that is harmful to you, other people, or property. Stop taking this medicine and tell your doctor if you start sleepwalking or have other unusual sleep-related activity. Decrease the amount of alcoholic beverages that you drink during treatment with this medicine until you know if this medicine affects your ability to tolerate alcohol. Some people have experienced increased drunkenness (intoxication), unusual or sometimes aggressive behavior, or no memory of things that have happened (amnesia) during treatment with this medicine. The use of this medicine may increase the chance of suicidal thoughts or actions. Pay special attention to how you are responding while on this medicine. Any worsening of mood, or thoughts of suicide or dying should be reported to your health care  professional right away. What side effects may I notice from receiving this medicine? Side effects that you should report to your doctor or health care professional as soon as possible: -allergic reactions like skin rash, itching or hives, swelling of the face, lips, tongue, or throat -acting aggressive, being angry or violent, or acting on dangerous impulses -breathing problems -changes in vision -chest pain or chest tightness -confusion, trouble speaking or understanding -new or worsening depression, anxiety, or panic attacks -extreme increase in activity and talking (mania) -fast, irregular heartbeat -feeling faint or lightheaded, falls -fever -pain in legs when walking -problems with balance, talking, walking -redness, blistering, peeling or loosening of the skin, including inside the mouth -ringing in ears -seeing or hearing things that aren't there (hallucinations) -seizures -sleepwalking -sudden numbness or weakness of the face, arm or leg -thoughts about suicide or dying, or attempts to commit suicide -trouble passing urine or change in the amount of urine -unusual bleeding or bruising -unusually weak or tired Side effects that usually do not require medical attention (report to your doctor or health care professional if they continue or are bothersome): -constipation -headache -nausea, vomiting -strange dreams -stomach gas -trouble sleeping This list may not describe all possible side  effects. Call your doctor for medical advice about side effects. You may report side effects to FDA at 1-800-FDA-1088. Where should I keep my medicine? Keep out of the reach of children. Store at room temperature between 15 and 30 degrees C (59 and 86 degrees F). Throw away any unused medicine after the expiration date. NOTE: This sheet is a summary. It may not cover all possible information. If you have questions about this medicine, talk to your doctor, pharmacist, or health care  provider.  2018 Elsevier/Gold Standard (2015-03-25 16:14:23)

## 2017-04-25 NOTE — Progress Notes (Signed)
Subjective:    Patient ID: Kathryn Fox , female   DOB: 1990/08/21 , 26 y.o..   MRN: [id number]  HPI  Kathryn Fox is here for  Chief Complaint  Patient presents with  . Contraception    1. Birth Control: Patient states that she previously was not on any birth control because she was not sexually active. Recently she has become sexually active and wants some form of birth control. She is using condoms when she is sexually active. Her last  period was a couple weeks ago.She has been on Depo-Provera in the past and did not like this. She was also on OCPs and could not remember to take the pills. She has heard bad stories about the IUD and she is not interested. Is not interested in the NuvaRing because she does not want to put anything in her vagina. She notes that she has used birth control patches in the past which she liked.   2. Tobacco abuse: Has been smoking 5 cigarettes per day for the paste couple months. Smoked years ago and was able to quit. Saw a commercial for Chantix and she is interested in this.  Review of Systems: Per HPI.   Health Maintenance Due  Topic Date Due  . INFLUENZA VACCINE  02/21/2017    Past Medical History: Patient Active Problem List   Diagnosis Date Noted  . Contraception management 04/25/2017  . Tobacco abuse 04/25/2017  . Shortness of breath 01/02/2017  . 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 Depression 05/29/2013  . Neck pain, chronic 02/25/2011  . Migraine without aura 09/22/2010    Medications: reviewed   Social Hx:  reports that she quit smoking about 2 years ago. Her smoking use included Cigars. She smoked 0.00 packs per day. She uses smokeless tobacco.   Objective:   BP 104/60   Pulse 81   Temp 98.6 F (37 C) (Oral)   Ht 5\' 4"  (1.626 m)   Wt 151 lb 12.8 oz (68.9 kg)   LMP 04/18/2017   SpO2 98%   BMI 26.06 kg/m  Physical Exam  Gen: NAD, alert,  cooperative with exam, well-appearing Psych: good insight, normal mood and affect  Results for orders placed or performed in visit on 04/25/17  POCT urine pregnancy  Result Value Ref Range   Preg Test, Ur Negative Negative     Assessment & Plan:  Contraception management All birth control options discussed with patient today. She is most interested in using the birth control patch. Prescriptions for this in to her pharmacy. Pregnancy test negative today.  Tobacco abuse Interested in quitting smoking. Discussed all options of cold [place], nicotine replacement, and Chantix. Patient is interested in starting Chantix. She is going to quit smoking tomorrow and start the Chantix. Prescription sent for a 12 week course.  Orders Placed This Encounter  Procedures  . POCT urine pregnancy   Meds ordered this encounter  Medications  . norelgestromin-ethinyl estradiol (ORTHO EVRA) 150-35 MCG/24HR transdermal patch    Sig: Place 1 patch onto the skin once a week.    Dispense:  3 patch    Refill:  12  . varenicline (CHANTIX STARTING MONTH PAK) 0.5 MG X 11 & 1 MG X 42 tablet    Sig: Take one 0.5 mg tablet by mouth once daily for 3 days, then increase to one 0.5 mg tablet twice daily for 4 days, then increase to one  1 mg tablet twice daily.    Dispense:  53 tablet    Refill:  0  . varenicline (CHANTIX CONTINUING MONTH PAK) 1 MG tablet    Sig: Take 1 tablet (1 mg total) by mouth 2 (two) times daily.    Dispense:  60 tablet    Refill:  1    Smitty Cords, MD [place], PGY-3

## 2017-05-09 ENCOUNTER — Emergency Department (HOSPITAL_COMMUNITY): Payer: BLUE CROSS/BLUE SHIELD

## 2017-05-09 ENCOUNTER — Emergency Department (HOSPITAL_COMMUNITY)
Admission: EM | Admit: 2017-05-09 | Discharge: 2017-05-09 | Disposition: A | Discharge status: Home / Self Care | Payer: BLUE CROSS/BLUE SHIELD | Attending: Emergency Medicine | Admitting: Emergency Medicine

## 2017-05-09 ENCOUNTER — Encounter (HOSPITAL_COMMUNITY): Payer: Self-pay | Admitting: Emergency Medicine

## 2017-05-09 DIAGNOSIS — R51 Headache: Secondary | ICD-10-CM | POA: Diagnosis not present

## 2017-05-09 DIAGNOSIS — Z79899 Other long term (current) drug therapy: Secondary | ICD-10-CM | POA: Insufficient documentation

## 2017-05-09 DIAGNOSIS — F17200 Nicotine dependence, unspecified, uncomplicated: Secondary | ICD-10-CM | POA: Insufficient documentation

## 2017-05-09 DIAGNOSIS — R110 Nausea: Secondary | ICD-10-CM

## 2017-05-09 DIAGNOSIS — R0602 Shortness of breath: Secondary | ICD-10-CM | POA: Diagnosis not present

## 2017-05-09 DIAGNOSIS — R519 Headache, unspecified: Secondary | ICD-10-CM

## 2017-05-09 LAB — COMPREHENSIVE METABOLIC PANEL
ALBUMIN: 3.8 g/dL (ref 3.5–5.0)
ALT: 42 U/L (ref 14–54)
ANION GAP: 7 (ref 5–15)
AST: 23 U/L (ref 15–41)
Alkaline Phosphatase: 68 U/L (ref 38–126)
BILIRUBIN TOTAL: 0.1 mg/dL — AB (ref 0.3–1.2)
BUN: 7 mg/dL (ref 6–20)
CO2: 23 mmol/L (ref 22–32)
Calcium: 9.1 mg/dL (ref 8.9–10.3)
Chloride: 109 mmol/L (ref 101–111)
Creatinine, Ser: 0.69 mg/dL (ref 0.44–1.00)
GFR calc Af Amer: >60 mL/min (ref >60)
GFR calc non Af Amer: >60 mL/min (ref >60)
GLUCOSE: 77 mg/dL (ref 65–99)
POTASSIUM: 3.9 mmol/L (ref 3.5–5.1)
SODIUM: 139 mmol/L (ref 135–145)
TOTAL PROTEIN: 7.3 g/dL (ref 6.5–8.1)

## 2017-05-09 LAB — URINALYSIS, ROUTINE W REFLEX MICROSCOPIC
Bilirubin Urine: NEGATIVE
Glucose, UA: NEGATIVE mg/dL
Ketones, ur: NEGATIVE mg/dL
Nitrite: NEGATIVE
PROTEIN: NEGATIVE mg/dL
SPECIFIC GRAVITY, URINE: 1.005 (ref 1.005–1.030)
pH: 7 (ref 5.0–8.0)

## 2017-05-09 LAB — PREGNANCY, URINE: Preg Test, Ur: NEGATIVE

## 2017-05-09 LAB — CBC
HEMATOCRIT: 35.8 % — AB (ref 36.0–46.0)
HEMOGLOBIN: 11.7 g/dL — AB (ref 12.0–15.0)
MCH: 26.8 pg (ref 26.0–34.0)
MCHC: 32.7 g/dL (ref 30.0–36.0)
MCV: 82.1 fL (ref 78.0–100.0)
Platelets: 309 10*3/uL (ref 150–400)
RBC: 4.36 MIL/uL (ref 3.87–5.11)
RDW: 14.8 % (ref 11.5–15.5)
WBC: 10.6 10*3/uL — ABNORMAL HIGH (ref 4.0–10.5)

## 2017-05-09 LAB — LIPASE, BLOOD: Lipase: 22 U/L (ref 11–51)

## 2017-05-09 LAB — D-DIMER, QUANTITATIVE (NOT AT ARMC): D-Dimer, Quant: 0.89 ug/mL-FEU — ABNORMAL HIGH (ref 0.00–0.50)

## 2017-05-09 MED ORDER — ONDANSETRON HCL 4 MG PO TABS
4.0000 mg | ORAL_TABLET | Freq: Once | ORAL | Status: AC
Start: 2017-05-09 — End: 2017-05-09
  Administered 2017-05-09: 4 mg via ORAL
  Filled 2017-05-09: qty 1

## 2017-05-09 MED ORDER — ONDANSETRON HCL 4 MG PO TABS: 4.0000 mg | ORAL_TABLET | Freq: Four times a day (QID) | ORAL | 0 refills | Status: DC

## 2017-05-09 MED ORDER — IOPAMIDOL (ISOVUE-370) INJECTION 76%
INTRAVENOUS | Status: AC
  Administered 2017-05-09: 100 mL
  Filled 2017-05-09: qty 100

## 2017-05-09 MED ORDER — KETOROLAC TROMETHAMINE 30 MG/ML IJ SOLN
30.0000 mg | Freq: Once | INTRAMUSCULAR | Status: AC
  Administered 2017-05-09: 30 mg via INTRAMUSCULAR
  Filled 2017-05-09: qty 1

## 2017-05-09 NOTE — ED Notes (Signed)
RN called lab about d dimer. Lab to run d dimer now

## 2017-05-09 NOTE — ED Notes (Signed)
Pt stable,ambulatory, and verbalizes understanding of D/C instructions.   

## 2017-05-09 NOTE — ED Provider Notes (Signed)
[facility] Provider Note   CSN: [id number] Arrival date & time: 05/09/17  1438     History   Chief Complaint Chief Complaint  Patient presents with  . Nausea    HPI Kathryn Fox is a 26 y.o. female.  HPI    26 year old female presents today with numerous complaints.  Patient reports a migraine similar to previous.  She describes this left-sided sharp and throbbing.  She notes this is been present for 3 days, took Excedrin which seemed to help temporarily.  She denies any distal neurological deficits, neck pain or stiffness, or any other red flags.  Patient also reports yesterday she started developing nausea, she thinks this is secondary to starting new birth control last week.  She notes this is an estrogen-based birth control and was recommended that she not smoke cigarettes.  Patient notes that she has been smoking cigarettes, and is also developed a pleuritic chest pain with inspiration.  She notes that she feels like her heart is occasionally skipping a beat, and feels short of breath coming and going.  Patient requesting evaluation for PE.  She denies any fevers.  Denies any chest pain presently.  Patient denies any lower extremity swelling or edema, history DVT or PE, or any other significant risk factors other than those noted above.  No urinary symptoms.    Past Medical History:  Diagnosis Date  . Chronic abdominal pain   . Depression    hx of, no current problems  . Headache   . Infection    UTI  . 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 Colposcopy in April, 2015 clinically normal. Recommend repeat her test Pap in one year.   . SVD (spontaneous vaginal delivery) 01/17/2015  . Vaginal Pap smear, abnormal    bx, f/u ok since    Patient Active Problem List   Diagnosis Date Noted  . Contraception management 04/25/2017  . Tobacco abuse 04/25/2017  .  Shortness of breath 01/02/2017  . 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 Depression 05/29/2013  . Neck pain, chronic 02/25/2011  . Migraine without aura 09/22/2010    Past Surgical History:  Procedure Laterality Date  . BREAST FIBROADENOMA SURGERY      OB History    Gravida Para Term Preterm AB Living   1 1 1     1    SAB TAB Ectopic Multiple Live Births         0 1       Home Medications    Prior to Admission medications   Medication Sig Start Date End Date Taking? Authorizing Provider  aspirin-acetaminophen-caffeine (EXCEDRIN MIGRAINE) [phone] MG tablet Take 2 tablets by mouth every 6 (six) hours as needed for headache.   Yes [provider]  norelgestromin-ethinyl estradiol (ORTHO EVRA) 150-35 MCG/24HR transdermal patch Place 1 patch onto the skin once a week. 04/25/17  Yes Carlyle Dolly, MD  HYDROcodone-homatropine [facility]) 5-1.5 MG/5ML syrup Take 5 mLs by mouth every 6 (six) hours as needed for cough. Patient not taking: Reported on 05/09/2017 10/21/16   Duffy Bruce, MD  ondansetron (ZOFRAN) 4 MG tablet Take 1 tablet (4 mg total) by mouth every 6 (six) hours. 05/09/17   Porchea Charrier, Dellis Filbert, PA-C  oseltamivir (TAMIFLU) 75 MG capsule Take 1 capsule (75 mg total) by mouth every 12 (twelve) hours. Patient not taking: Reported  on 05/09/2017 10/21/16   Duffy Bruce, MD  triamcinolone cream (KENALOG) 0.1 % Apply 1 application topically 2 (two) times daily. Patient not taking: Reported on 05/09/2017 08/21/16   Barnet Glasgow, NP  varenicline (CHANTIX CONTINUING MONTH PAK) 1 MG tablet Take 1 tablet (1 mg total) by mouth 2 (two) times daily. Patient not taking: Reported on 05/09/2017 04/25/17   Carlyle Dolly, MD  varenicline (CHANTIX STARTING MONTH PAK) 0.5 MG X 11 & 1 MG X 42 tablet Take one 0.5 mg tablet by mouth once daily for 3 days, then increase to one 0.5 mg tablet twice daily  for 4 days, then increase to one 1 mg tablet twice daily. 04/25/17   Carlyle Dolly, MD    Family History Family History  Problem Relation Age of Onset  . Cancer Maternal Aunt        Breast  . Diabetes Maternal Aunt   . Cancer Maternal Grandfather        lung  . Hypertension Mother   . Hyperlipidemia Mother   . Diabetes Mother     Social History Social History  Substance Use Topics  . Smoking status: Former Smoker    Packs/day: 0.00    Types: Cigars    Quit date: 05/24/2014  . Smokeless tobacco: Current User     Comment: also smoked hookah  . Alcohol use No     Allergies   Mushroom extract complex; Peanut-containing drug products; Milk-related compounds; and Septra [sulfamethoxazole-trimethoprim]   Review of Systems Review of Systems  All other systems reviewed and are negative.    Physical Exam Updated Vital Signs BP 124/64 (BP Location: Right Arm)   Pulse 82   Temp 98.1 F (36.7 C) (Oral)   Resp 16   LMP 04/18/2017   SpO2 100%   Physical Exam  Constitutional: She is oriented to person, place, and time. She appears well-developed and well-nourished.  HENT:  Head: Normocephalic and atraumatic.  Eyes: Pupils are equal, round, and reactive to light. Conjunctivae are normal. Right eye exhibits no discharge. Left eye exhibits no discharge. No scleral icterus.  Neck: Normal range of motion. No JVD present. No tracheal deviation present.  Cardiovascular: Normal rate, regular rhythm, normal heart sounds and intact distal pulses.  Exam reveals no gallop and no friction rub.   No murmur heard. Pulmonary/Chest: Effort normal and breath sounds normal. No stridor. No respiratory distress. She has no wheezes. She has no rales. She exhibits no tenderness.  Abdominal: Soft.  Musculoskeletal: She exhibits no edema.  Neurological: She is alert and oriented to person, place, and time. Coordination normal.  Skin: Skin is warm.  Psychiatric: She has a normal mood and  affect. Her behavior is normal. Judgment and thought content normal.  Nursing note and vitals reviewed.    ED Treatments / Results  Labs (all labs ordered are listed, but only abnormal results are displayed) Labs Reviewed  COMPREHENSIVE METABOLIC PANEL - Abnormal; Notable for the following:       Result Value   Total Bilirubin 0.1 (*)    All other components within normal limits  CBC - Abnormal; Notable for the following:    WBC 10.6 (*)    Hemoglobin 11.7 (*)    HCT 35.8 (*)    All other components within normal limits  URINALYSIS, ROUTINE W REFLEX MICROSCOPIC - Abnormal; Notable for the following:    Color, Urine STRAW (*)    Hgb urine dipstick LARGE (*)    Leukocytes, UA  MODERATE (*)    Bacteria, UA RARE (*)    Squamous Epithelial / LPF 0-5 (*)    All other components within normal limits  D-DIMER, QUANTITATIVE (NOT AT [facility]) - Abnormal; Notable for the following:    D-Dimer, Quant 0.89 (*)    All other components within normal limits  LIPASE, BLOOD  PREGNANCY, URINE    EKG  EKG Interpretation  Date/Time:  Wednesday May 09 2017 15:56:20 EDT Ventricular Rate:  71 PR Interval:  158 QRS Duration: 82 QT Interval:  350 QTC Calculation: 380 R Axis:   79 Text Interpretation:  Sinus rhythm with Premature atrial complexes Otherwise within normal limits Confirmed by Carmin Muskrat [phone]) on 05/09/2017 4:31:34 PM       Radiology No results found.  Procedures Procedures (including critical care time)  Medications Ordered in ED Medications  ketorolac (TORADOL) 30 MG/ML injection 30 mg (30 mg Intramuscular Given 05/09/17 1607)  ondansetron (ZOFRAN) tablet 4 mg (4 mg Oral Given 05/09/17 1606)  iopamidol (ISOVUE-370) 76 % injection (100 mLs  Contrast Given 05/09/17 2047)     Initial Impression / Assessment and Plan / ED Course  I have reviewed the triage vital signs and the nursing notes.  Pertinent labs & imaging results that were available during my care of  the patient were reviewed by me and considered in my medical decision making (see chart for details).     Final Clinical Impressions(s) / ED Diagnoses   Final diagnoses:  Nausea  SOB (shortness of breath)  Acute nonintractable headache, unspecified headache type    Labs: Urinalysis, lipase, CMP CBC has, d-dimer, urine pregnancy  Imaging: CT angios chest PE  Consults:  Therapeutics: Toradol, Zofran  Discharge Meds:   Assessment/Plan: 26 year old female presents today with numerous complaints.  Patient has nausea and headache.  Headache with no red flags similar to previous.  Patient's nausea is likely secondary to recent starting of birth control.  Patient concern for pulmonary embolism after going on line.  She does endorse some intermittent pleuritic chest pain and intermittent shortness of breath.  Patient requesting evaluation for this.  She has an elevated d-dimer, CT scan pending.  Low suspicion for acute pulmonary embolism low suspicion for ACS, or any other significant intrathoracic abnormality.  Patient is resting comfortably in exam bed in no acute distress with reassuring vital signs and reassuring analysis here.  If CT scan shows no acute PE patient will be discharged with outpatient follow-up with her primary care for ongoing evaluation and management.  I discussed this with patient she agrees today's plan has no further questions or concerns the time discharge.  Patient CT scan will be evaluated by oncoming provider.    New Prescriptions New Prescriptions   ONDANSETRON (ZOFRAN) 4 MG TABLET    Take 1 tablet (4 mg total) by mouth every 6 (six) hours.     Okey Regal, PA-C 05/09/17 2107    Carmin Muskrat, MD 05/10/17 [phone]

## 2017-05-09 NOTE — ED Triage Notes (Signed)
Pt reports she started back on her  Birth control patch one week ago. Pt reports nausea with out vomiting , HA and can not eat or drink. Pt is a current every day smoker but has not smoke in last to day because of nausea. Pt is aware a urine sample is needed but reports she just voided .

## 2017-05-09 NOTE — ED Triage Notes (Signed)
pt states for the last  2 days she has been having a headache with nausea and feeling "faint". Pt states she recently began a new birth control.

## 2017-05-09 NOTE — Discharge Instructions (Signed)
Please read attached information. If you experience any new or worsening signs or symptoms please return to the emergency room for evaluation. Please follow-up with your primary care provider or specialist as discussed. Please use medication prescribed only as directed and discontinue taking if you have any concerning signs or symptoms.   °

## 2017-05-14 ENCOUNTER — Encounter: Payer: Self-pay | Admitting: Family Medicine

## 2017-05-14 ENCOUNTER — Ambulatory Visit (INDEPENDENT_AMBULATORY_CARE_PROVIDER_SITE_OTHER): Payer: BLUE CROSS/BLUE SHIELD | Admitting: Family Medicine

## 2017-05-14 VITALS — BP 104/62 | HR 68 | Temp 98.6°F | Ht 64.0 in | Wt 158.4 lb

## 2017-05-14 DIAGNOSIS — K219 Gastro-esophageal reflux disease without esophagitis: Secondary | ICD-10-CM

## 2017-05-14 MED ORDER — OMEPRAZOLE 20 MG PO CPDR: 20.0000 mg | DELAYED_RELEASE_CAPSULE | Freq: Every day | ORAL | 3 refills | Status: DC

## 2017-05-14 NOTE — Patient Instructions (Addendum)
Kathryn Fox, you were seen today for burning chest tightness and abdominal pain worse with fried foods and chocolate. This sounds like acid reflux. I want to try you on a medication called omeprazole. You can take this medication for 6 weeks.   You should follow up with me around that time.   Take care, [name], [place] Resident PGY-2 05/14/2017 4:44 PM     Food Choices for Gastroesophageal Reflux Disease, Adult When you have gastroesophageal reflux disease (GERD), the foods you eat and your eating habits are very important. Choosing the right foods can help ease your discomfort. What guidelines do I need to follow?  Choose fruits, vegetables, whole grains, and low-fat dairy products.  Choose low-fat meat, fish, and poultry.  Limit fats such as oils, salad dressings, butter, nuts, and avocado.  Keep a food diary. This helps you identify foods that cause symptoms.  Avoid foods that cause symptoms. These may be different for everyone.  Eat small meals often instead of 3 large meals a day.  Eat your meals slowly, in a place where you are relaxed.  Limit fried foods.  Cook foods using methods other than frying.  Avoid drinking alcohol.  Avoid drinking large amounts of liquids with your meals.  Avoid bending over or lying down until 2-3 hours after eating. What foods are not recommended? These are some foods and drinks that may make your symptoms worse: Vegetables Tomatoes. Tomato juice. Tomato and spaghetti sauce. Chili peppers. Onion and garlic. Horseradish. Fruits Oranges, grapefruit, and lemon (fruit and juice). Meats High-fat meats, fish, and poultry. This includes hot dogs, ribs, ham, sausage, salami, and bacon. Dairy,l Whole milk and chocolate milk. Sour cream. Cream. Butter. Ice cream. Cream cheese. Drinks Coffee and tea. Bubbly (carbonated) drinks or energy drinks. Condiments Hot sauce. Barbecue sauce. Sweets/Desserts Chocolate and  cocoa. Donuts. Peppermint and spearmint. Fats and Oils High-fat foods. This includes [place] fries and potato chips. Other Vinegar. Strong spices. This includes black pepper, white pepper, red pepper, cayenne, curry powder, cloves, ginger, and chili powder. The items listed above may not be a complete list of foods and drinks to avoid. Contact your dietitian for more information. This information is not intended to replace advice given to you by your health care provider. Make sure you discuss any questions you have with your health care provider. Document Released: 01/09/2012 Document Revised: 12/16/2015 Document Reviewed: 05/14/2013 Elsevier Interactive Patient Education  2017 [employer].

## 2017-05-14 NOTE — Progress Notes (Signed)
    Subjective:  Kathryn Fox is a 26 y.o. female who presents to the [facility] today with a chief complaint of tightness in her chest   HPI:  Abdominal pain and chest tightness:  Was at work last week and had chest tightness with abdominal pain and was evaluated in the ED. Reports that she frequently wakes up with chest tightness and feelings of shortness of breath.  Worse with fried foods and chocolate. Does have some relief with water and pepcid. Denies hematemesis or hematochezia/melena, diarrhea or vomiting, fevers or chills.   PMH: migraine with aura Tobacco use: on an off Medication: reviewed and updated ROS: see HPI   Objective:  Physical Exam: BP 104/62   Pulse 68   Temp 98.6 F (37 C) (Oral)   Ht 5\' 4"  (1.626 m)   Wt 158 lb 6.4 oz (71.8 kg)   LMP 04/18/2017   SpO2 98%   BMI 27.19 kg/m   Gen: 25yo F in NAD, resting comfortably CV: RRR with no murmurs appreciated Pulm: NWOB, CTAB with no crackles, wheezes, or rhonchi GI: Normal bowel sounds present. Soft, Nontender, Nondistended. MSK: no edema, cyanosis, or clubbing noted Skin: warm, dry Neuro: grossly normal, moves all extremities Psych: Normal affect and thought content  No results found for this or any previous visit (from the past 72 hour(s)).   Assessment/Plan:  Abdominal pain Story of abdomina pain worse with chocolate and fried foods, waking up in the AM with metallic taste in mouth also with occasional SOB especially at night most likely represents GERD. She has no red flags. Started patient on 20mg  omeprazole for 6 weeks. Will have her back around that time and reassess.  Return precautions discussed.   Seaira Byus L. Rosalyn Gess, [place] [facility] Resident PGY-2 05/15/2017 5:19 PM

## 2017-05-17 ENCOUNTER — Telehealth: Payer: Self-pay | Admitting: Internal Medicine

## 2017-05-17 NOTE — Telephone Encounter (Signed)
Pt states the acid reflux medicine (omepranzole) isnt working. Could dr prescribe something else?  [facility] on [address]

## 2017-05-17 NOTE — Telephone Encounter (Signed)
Will forward to Dr. Rosalyn Gess who saw patient. Jazmin Hartsell,CMA

## 2017-05-18 NOTE — Telephone Encounter (Signed)
Spoke to Kathryn Fox. Has noticed a little improvement today. Will give the medicine some more time. Kathryn Fox will call an schedule an appt to see Dr. Rosalyn Gess. Ottis Stain, CMA

## 2017-05-18 NOTE — Telephone Encounter (Signed)
I would recommend that she give the medication some more time. She has only been taking it for a few days now. I wanted her to follow up in a month to see if there was any improvement. There are no other medications that I would recommend at this time.  Bilbo Carcamo L. Rosalyn Gess, [place] Medicine Resident PGY-2 05/18/2017 5:09 PM

## 2018-01-03 ENCOUNTER — Other Ambulatory Visit: Payer: Self-pay

## 2018-01-03 ENCOUNTER — Ambulatory Visit (INDEPENDENT_AMBULATORY_CARE_PROVIDER_SITE_OTHER): Payer: Medicaid Other | Admitting: Internal Medicine

## 2018-01-03 ENCOUNTER — Encounter: Payer: Self-pay | Admitting: Internal Medicine

## 2018-01-03 VITALS — BP 112/72 | HR 84 | Temp 98.5°F | Ht 64.0 in | Wt 160.6 lb

## 2018-01-03 DIAGNOSIS — R8290 Unspecified abnormal findings in urine: Secondary | ICD-10-CM

## 2018-01-03 DIAGNOSIS — Z3009 Encounter for other general counseling and advice on contraception: Secondary | ICD-10-CM | POA: Diagnosis not present

## 2018-01-03 DIAGNOSIS — Z3201 Encounter for pregnancy test, result positive: Secondary | ICD-10-CM

## 2018-01-03 LAB — POCT URINALYSIS DIP (MANUAL ENTRY)
BILIRUBIN UA: NEGATIVE
Glucose, UA: NEGATIVE mg/dL
Ketones, POC UA: NEGATIVE mg/dL
Leukocytes, UA: NEGATIVE
NITRITE UA: NEGATIVE
PH UA: 7 (ref 5.0–8.0)
PROTEIN UA: NEGATIVE mg/dL
Spec Grav, UA: 1.02 (ref 1.010–1.025)
Urobilinogen, UA: 0.2 E.U./dL

## 2018-01-03 LAB — POCT URINE PREGNANCY: PREG TEST UR: POSITIVE — AB

## 2018-01-03 LAB — POCT UA - MICROSCOPIC ONLY

## 2018-01-03 NOTE — Patient Instructions (Signed)
Your pregnancy test was positive. Start taking a prenatal vitamin right away. I would recommend establishing with OB as soon as possible to have a initial visit and a dating ultrasound.   Your urine did not look like it was infectious but I will send it for a culture.    First Trimester of Pregnancy The first trimester of pregnancy is from week 1 until the end of week 13 (months 1 through 3). During this time, your baby will begin to develop inside you. At 6-8 weeks, the eyes and face are formed, and the heartbeat can be seen on ultrasound. At the end of 12 weeks, all the baby's organs are formed. Prenatal care is all the medical care you receive before the birth of your baby. Make sure you get good prenatal care and follow all of your doctor's instructions. Follow these instructions at home: Medicines  Take over-the-counter and prescription medicines only as told by your doctor. Some medicines are safe and some medicines are not safe during pregnancy.  Take a prenatal vitamin that contains at least 600 micrograms (mcg) of folic acid.  If you have trouble pooping (constipation), take medicine that will make your stool soft (stool softener) if your doctor approves. Eating and drinking  Eat regular, healthy meals.  Your doctor will tell you the amount of weight gain that is right for you.  Avoid raw meat and uncooked cheese.  If you feel sick to your stomach (nauseous) or throw up (vomit): ? Eat 4 or 5 small meals a day instead of 3 large meals. ? Try eating a few soda crackers. ? Drink liquids between meals instead of during meals.  To prevent constipation: ? Eat foods that are high in fiber, like fresh fruits and vegetables, whole grains, and beans. ? Drink enough fluids to keep your pee (urine) clear or pale yellow. Activity  Exercise only as told by your doctor. Stop exercising if you have cramps or pain in your lower belly (abdomen) or low back.  Do not exercise if it is too  hot, too humid, or if you are in a place of great height (high altitude).  Try to avoid standing for long periods of time. Move your legs often if you must stand in one place for a long time.  Avoid heavy lifting.  Wear low-heeled shoes. Sit and stand up straight.  You can have sex unless your doctor tells you not to. Relieving pain and discomfort  Wear a good support bra if your breasts are sore.  Take warm water baths (sitz baths) to soothe pain or discomfort caused by hemorrhoids. Use hemorrhoid cream if your doctor says it is okay.  Rest with your legs raised if you have leg cramps or low back pain.  If you have puffy, bulging veins (varicose veins) in your legs: ? Wear support hose or compression stockings as told by your doctor. ? Raise (elevate) your feet for 15 minutes, 3-4 times a day. ? Limit salt in your food. Prenatal care  Schedule your prenatal visits by the twelfth week of pregnancy.  Write down your questions. Take them to your prenatal visits.  Keep all your prenatal visits as told by your doctor. This is important. Safety  Wear your seat belt at all times when driving.  Make a list of emergency phone numbers. The list should include numbers for family, friends, the hospital, and police and fire departments. General instructions  Ask your doctor for a referral to a local prenatal  class. Begin classes no later than at the start of month 6 of your pregnancy.  Ask for help if you need counseling or if you need help with nutrition. Your doctor can give you advice or tell you where to go for help.  Do not use hot tubs, steam rooms, or saunas.  Do not douche or use tampons or scented sanitary pads.  Do not cross your legs for long periods of time.  Avoid all herbs and alcohol. Avoid drugs that are not approved by your doctor.  Do not use any tobacco products, including cigarettes, chewing tobacco, and electronic cigarettes. If you need help quitting, ask your  doctor. You may get counseling or other support to help you quit.  Avoid cat litter boxes and soil used by cats. These carry germs that can cause birth defects in the baby and can cause a loss of your baby (miscarriage) or stillbirth.  Visit your dentist. At home, brush your teeth with a soft toothbrush. Be gentle when you floss. Contact a doctor if:  You are dizzy.  You have mild cramps or pressure in your lower belly.  You have a nagging pain in your belly area.  You continue to feel sick to your stomach, you throw up, or you have watery poop (diarrhea).  You have a bad smelling fluid coming from your vagina.  You have pain when you pee (urinate).  You have increased puffiness (swelling) in your face, hands, legs, or ankles. Get help right away if:  You have a fever.  You are leaking fluid from your vagina.  You have spotting or bleeding from your vagina.  You have very bad belly cramping or pain.  You gain or lose weight rapidly.  You throw up blood. It may look like coffee grounds.  You are around people who have [place] measles, fifth disease, or chickenpox.  You have a very bad headache.  You have shortness of breath.  You have any kind of trauma, such as from a fall or a car accident. Summary  The first trimester of pregnancy is from week 1 until the end of week 13 (months 1 through 3).  To take care of yourself and your unborn baby, you will need to eat healthy meals, take medicines only if your doctor tells you to do so, and do activities that are safe for you and your baby.  Keep all follow-up visits as told by your doctor. This is important as your doctor will have to ensure that your baby is healthy and growing well. This information is not intended to replace advice given to you by your health care provider. Make sure you discuss any questions you have with your health care provider. Document Released: 12/27/2007 Document Revised: 07/18/2016 Document  Reviewed: 07/18/2016 Elsevier Interactive Patient Education  2017 [employer].

## 2018-01-03 NOTE — Progress Notes (Signed)
   Subjective:    Kathryn Fox - 27 y.o. female MRN [id number]  Date of birth: Mar 03, 1991  HPI  Kathryn Fox is here for contraception management. Patient is a G6P1 female who presents to change contraceptive methods. Was previously prescribed birth control patch but insurance stopped covering about 1-2 months ago and she has not been using anything for contraception. Her LMP was in April, she is unsure of the date. Upreg obtained and positive. Discussed options with patient. She declined resources for termination and will likely return to Ob-GYN she received care from with prior pregnancy. She reports that she developed a fever during delivery but otherwise had no complications. Denies history of GDM, gHTN and Pre-E. She has not been taking a PNV. She does not have any cats at home. Also endorses foul smelling urine. Denies urinary frequency, urgency, or dysuria. Denies vaginal discharge.     -  reports that she quit smoking about 3 years ago. Her smoking use included cigars. She smoked 0.00 packs per day. She uses smokeless tobacco. - Review of Systems: Per HPI. - Past Medical History: Patient Active Problem List   Diagnosis Date Noted  . Contraception management 04/25/2017  . Tobacco abuse 04/25/2017  . Shortness of breath 01/02/2017  . 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 Depression 05/29/2013  . Neck pain, chronic 02/25/2011  . Migraine without aura 09/22/2010   - Medications: reviewed and updated   Objective:   Physical Exam BP 112/72   Pulse 84   Temp 98.5 F (36.9 C) (Oral)   Ht 5\' 4"  (1.626 m)   Wt 160 lb 9.6 oz (72.8 kg)   LMP 10/30/2017 (Approximate)   SpO2 99%   BMI 27.57 kg/m  Gen: NAD, alert, cooperative with exam, well-appearing Psych: good insight, alert and oriented  Assessment & Plan:   1. Positive pregnancy test Upreg obtained for contraception purposes and was positive.  Patient wishes to continue with pregnancy and re-establish with OB. Discussed first trimester recommendations and precautions. Advised to start PNV immediately.   2. Encounter for other general counseling or advice on contraception - POCT urine pregnancy  3. Foul smelling urine UA with moderate blood but otherwise unremarkable. Given pregnancy, will send for OB urine culture.  - POCT urinalysis dipstick - POCT UA - Microscopic Only - Culture, OB Urine   Phill Myron, D.O. 01/03/2018, 10:24 AM PGY-3, [place]

## 2018-01-05 LAB — CULTURE, OB URINE

## 2018-01-05 LAB — URINE CULTURE, OB REFLEX

## 2018-01-07 ENCOUNTER — Other Ambulatory Visit: Payer: Self-pay | Admitting: Internal Medicine

## 2018-01-09 ENCOUNTER — Inpatient Hospital Stay (HOSPITAL_COMMUNITY)
Admission: AD | Admit: 2018-01-09 | Discharge: 2018-01-09 | Disposition: A | Discharge status: Home / Self Care | Payer: Medicaid Other | Source: Ambulatory Visit | Attending: Obstetrics & Gynecology | Admitting: Obstetrics & Gynecology

## 2018-01-09 ENCOUNTER — Encounter (HOSPITAL_COMMUNITY): Payer: Self-pay

## 2018-01-09 DIAGNOSIS — Z880 Allergy status to penicillin: Secondary | ICD-10-CM | POA: Diagnosis not present

## 2018-01-09 DIAGNOSIS — Z9889 Other specified postprocedural states: Secondary | ICD-10-CM | POA: Insufficient documentation

## 2018-01-09 DIAGNOSIS — O2341 Unspecified infection of urinary tract in pregnancy, first trimester: Secondary | ICD-10-CM | POA: Diagnosis not present

## 2018-01-09 DIAGNOSIS — Z3A10 10 weeks gestation of pregnancy: Secondary | ICD-10-CM | POA: Diagnosis not present

## 2018-01-09 DIAGNOSIS — Z882 Allergy status to sulfonamides status: Secondary | ICD-10-CM | POA: Diagnosis not present

## 2018-01-09 DIAGNOSIS — Z8249 Family history of ischemic heart disease and other diseases of the circulatory system: Secondary | ICD-10-CM | POA: Diagnosis not present

## 2018-01-09 DIAGNOSIS — G8929 Other chronic pain: Secondary | ICD-10-CM | POA: Diagnosis not present

## 2018-01-09 DIAGNOSIS — O99351 Diseases of the nervous system complicating pregnancy, first trimester: Secondary | ICD-10-CM | POA: Diagnosis not present

## 2018-01-09 DIAGNOSIS — K219 Gastro-esophageal reflux disease without esophagitis: Secondary | ICD-10-CM | POA: Diagnosis present

## 2018-01-09 DIAGNOSIS — K117 Disturbances of salivary secretion: Secondary | ICD-10-CM

## 2018-01-09 DIAGNOSIS — Z87891 Personal history of nicotine dependence: Secondary | ICD-10-CM | POA: Insufficient documentation

## 2018-01-09 DIAGNOSIS — Z91018 Allergy to other foods: Secondary | ICD-10-CM | POA: Insufficient documentation

## 2018-01-09 DIAGNOSIS — Z801 Family history of malignant neoplasm of trachea, bronchus and lung: Secondary | ICD-10-CM | POA: Insufficient documentation

## 2018-01-09 DIAGNOSIS — O2340 Unspecified infection of urinary tract in pregnancy, unspecified trimester: Secondary | ICD-10-CM

## 2018-01-09 DIAGNOSIS — F329 Major depressive disorder, single episode, unspecified: Secondary | ICD-10-CM | POA: Diagnosis not present

## 2018-01-09 DIAGNOSIS — Z803 Family history of malignant neoplasm of breast: Secondary | ICD-10-CM | POA: Diagnosis not present

## 2018-01-09 DIAGNOSIS — Z833 Family history of diabetes mellitus: Secondary | ICD-10-CM | POA: Diagnosis not present

## 2018-01-09 DIAGNOSIS — O219 Vomiting of pregnancy, unspecified: Secondary | ICD-10-CM | POA: Diagnosis not present

## 2018-01-09 DIAGNOSIS — Z79899 Other long term (current) drug therapy: Secondary | ICD-10-CM | POA: Insufficient documentation

## 2018-01-09 DIAGNOSIS — R0602 Shortness of breath: Secondary | ICD-10-CM | POA: Diagnosis not present

## 2018-01-09 DIAGNOSIS — O99611 Diseases of the digestive system complicating pregnancy, first trimester: Secondary | ICD-10-CM | POA: Diagnosis not present

## 2018-01-09 DIAGNOSIS — G43019 Migraine without aura, intractable, without status migrainosus: Secondary | ICD-10-CM | POA: Diagnosis not present

## 2018-01-09 DIAGNOSIS — Z91010 Allergy to peanuts: Secondary | ICD-10-CM | POA: Diagnosis not present

## 2018-01-09 DIAGNOSIS — Z91011 Allergy to milk products: Secondary | ICD-10-CM | POA: Diagnosis not present

## 2018-01-09 DIAGNOSIS — O99341 Other mental disorders complicating pregnancy, first trimester: Secondary | ICD-10-CM | POA: Insufficient documentation

## 2018-01-09 DIAGNOSIS — G43909 Migraine, unspecified, not intractable, without status migrainosus: Secondary | ICD-10-CM | POA: Diagnosis not present

## 2018-01-09 DIAGNOSIS — O26891 Other specified pregnancy related conditions, first trimester: Secondary | ICD-10-CM | POA: Diagnosis not present

## 2018-01-09 DIAGNOSIS — Z79891 Long term (current) use of opiate analgesic: Secondary | ICD-10-CM | POA: Insufficient documentation

## 2018-01-09 DIAGNOSIS — O9989 Other specified diseases and conditions complicating pregnancy, childbirth and the puerperium: Secondary | ICD-10-CM

## 2018-01-09 HISTORY — DX: Vomiting of pregnancy, unspecified: O21.9

## 2018-01-09 HISTORY — DX: Unspecified infection of urinary tract in pregnancy, unspecified trimester: O23.40

## 2018-01-09 LAB — URINALYSIS, ROUTINE W REFLEX MICROSCOPIC
Bilirubin Urine: NEGATIVE
GLUCOSE, UA: NEGATIVE mg/dL
Ketones, ur: NEGATIVE mg/dL
Nitrite: NEGATIVE
PH: 7 (ref 5.0–8.0)
Protein, ur: NEGATIVE mg/dL
Specific Gravity, Urine: 1.003 — ABNORMAL LOW (ref 1.005–1.030)

## 2018-01-09 MED ORDER — PROMETHAZINE HCL 25 MG PO TABS: 25.0000 mg | ORAL_TABLET | Freq: Four times a day (QID) | ORAL | 3 refills | Status: DC | PRN

## 2018-01-09 MED ORDER — GLYCOPYRROLATE 1 MG PO TABS: 1.0000 mg | ORAL_TABLET | Freq: Three times a day (TID) | ORAL | 0 refills | Status: DC

## 2018-01-09 MED ORDER — ONDANSETRON 8 MG PO TBDP
8.0000 mg | ORAL_TABLET | Freq: Once | ORAL | Status: AC
  Administered 2018-01-09: 8 mg via ORAL
  Filled 2018-01-09: qty 1

## 2018-01-09 MED ORDER — SCOPOLAMINE 1 MG/3DAYS TD PT72
1.0000 | MEDICATED_PATCH | TRANSDERMAL | Status: DC
  Administered 2018-01-09: 1.5 mg via TRANSDERMAL
  Filled 2018-01-09: qty 1

## 2018-01-09 MED ORDER — GLYCOPYRROLATE 1 MG PO TABS
1.0000 mg | ORAL_TABLET | Freq: Once | ORAL | Status: AC
  Administered 2018-01-09: 1 mg via ORAL
  Filled 2018-01-09: qty 1

## 2018-01-09 MED ORDER — BUTALBITAL-APAP-CAFFEINE 50-325-40 MG PO TABS: 1.0000 | ORAL_TABLET | Freq: Four times a day (QID) | ORAL | 0 refills | Status: DC | PRN

## 2018-01-09 MED ORDER — RANITIDINE HCL 150 MG PO TABS: 150.0000 mg | ORAL_TABLET | Freq: Two times a day (BID) | ORAL | 0 refills | Status: DC

## 2018-01-09 MED ORDER — NITROFURANTOIN MONOHYD MACRO 100 MG PO CAPS: 100.0000 mg | ORAL_CAPSULE | Freq: Two times a day (BID) | ORAL | 0 refills | Status: DC

## 2018-01-09 MED ORDER — METOCLOPRAMIDE HCL 10 MG PO TABS: 10.0000 mg | ORAL_TABLET | Freq: Four times a day (QID) | ORAL | 0 refills | Status: DC

## 2018-01-09 NOTE — MAU Note (Addendum)
Pt has been feeling worse 3 days ago with vomiting, feeling like it's stuck and can't breathe well. Pt has treated acid reflux otc but it's currently not helping. [facility] be [facility] for [facility].   Pt feels like she's producing more saliva than normal and when she tries to vomit feels like it won't come out because it's stuck.

## 2018-01-09 NOTE — MAU Provider Note (Signed)
History     CSN: [id number]  Arrival date and time: 01/09/18 1478   First Provider Initiated Contact with Patient 01/09/18 [phone]      Chief Complaint  Patient presents with  . Shortness of Breath   HPI  Ms.  Kathryn Fox is a 27 y.o. year old G4P1001 female at [redacted]w[redacted]d weeks gestation who presents to MAU reporting that she feels like she can't breath well d/t "feeling like vomit is stuck" and acid reflux that is not well-treated with Omperazole  at this time. She reports that she took Excedrin for a migraine H/A earlier and she no longer has a migraine.  Past Medical History:  Diagnosis Date  . Chronic abdominal pain   . Depression    hx of, no current problems  . Headache   . Infection    UTI  . 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 Colposcopy in April, 2015 clinically normal. Recommend repeat her test Pap in one year.   . SVD (spontaneous vaginal delivery) 01/17/2015  . Vaginal Pap smear, abnormal    bx, f/u ok since    Past Surgical History:  Procedure Laterality Date  . BREAST FIBROADENOMA SURGERY      Family History  Problem Relation Age of Onset  . Cancer Maternal Aunt        Breast  . Diabetes Maternal Aunt   . Cancer Maternal Grandfather        lung  . Hypertension Mother   . Hyperlipidemia Mother   . Diabetes Mother     Social History   Tobacco Use  . Smoking status: Former Smoker    Packs/day: 0.00    Types: Cigars    Last attempt to quit: 05/24/2014    Years since quitting: 3.6  . Smokeless tobacco: Current User  . Tobacco comment: also smoked hookah  Substance Use Topics  . Alcohol use: No  . Drug use: No    Allergies:  Allergies  Allergen Reactions  . Mushroom Extract Complex Anaphylaxis    Facial swelling  . Peanut-Containing Drug Products Anaphylaxis  . Penicillins Anaphylaxis  . Milk-Related Compounds Nausea And Vomiting  . Septra  [Sulfamethoxazole-Trimethoprim] Hives    Medications Prior to Admission  Medication Sig Dispense Refill Last Dose  . aspirin-acetaminophen-caffeine (EXCEDRIN MIGRAINE) 250-250-65 MG tablet Take 2 tablets by mouth every 6 (six) hours as needed for headache.   05/09/2017 at Unknown time  . HYDROcodone-homatropine (HYCODAN) 5-1.5 MG/5ML syrup Take 5 mLs by mouth every 6 (six) hours as needed for cough. (Patient not taking: Reported on 05/09/2017) 80 mL 0 Not Taking at Unknown time  . norelgestromin-ethinyl estradiol (ORTHO EVRA) 150-35 MCG/24HR transdermal patch Place 1 patch onto the skin once a week. 3 patch 12 05/09/2017 at Unknown time  . omeprazole (PRILOSEC) 20 MG capsule Take 1 capsule (20 mg total) by mouth daily. 30 capsule 3   . ondansetron (ZOFRAN) 4 MG tablet Take 1 tablet (4 mg total) by mouth every 6 (six) hours. 12 tablet 0   . oseltamivir (TAMIFLU) 75 MG capsule Take 1 capsule (75 mg total) by mouth every 12 (twelve) hours. (Patient not taking: Reported on 05/09/2017) 10 capsule 0 Not Taking at Unknown time  . triamcinolone cream (KENALOG) 0.1 % Apply 1 application topically 2 (two) times daily. (Patient not taking: Reported on 05/09/2017) 30 g 0 Not Taking at Unknown time  . varenicline (CHANTIX CONTINUING MONTH PAK) 1 MG  tablet Take 1 tablet (1 mg total) by mouth 2 (two) times daily. (Patient not taking: Reported on 05/09/2017) 60 tablet 1 Not Taking at Unknown time  . varenicline (CHANTIX STARTING MONTH PAK) 0.5 MG X 11 & 1 MG X 42 tablet Take one 0.5 mg tablet by mouth once daily for 3 days, then increase to one 0.5 mg tablet twice daily for 4 days, then increase to one 1 mg tablet twice daily. 53 tablet 0     Review of Systems  Constitutional: Negative.   HENT: Negative.   Eyes: Negative.   Respiratory: Positive for shortness of breath.   Gastrointestinal: Positive for nausea and vomiting.  Endocrine: Negative.   Genitourinary: Negative.   Musculoskeletal: Negative.   Skin:  Negative.   Allergic/Immunologic: Negative.   Neurological: Negative.   Hematological: Negative.   Psychiatric/Behavioral: Negative.    Physical Exam   Blood pressure 118/64, pulse 76, temperature 98.3 F (36.8 C), temperature source Oral, last menstrual period 10/30/2017, SpO2 100 % - on continuous pulse oximetry.  Physical Exam  Nursing note and vitals reviewed. Constitutional: She is oriented to person, place, and time. She appears well-developed and well-nourished.  HENT:  Head: Atraumatic.  Eyes: Pupils are equal, round, and reactive to light.  Neck: Normal range of motion.  Cardiovascular: Normal rate.  Respiratory: Effort normal.  GI: Soft.  Musculoskeletal: Normal range of motion.  Neurological: She is alert and oriented to person, place, and time.    MAU Course  Procedures  MDM CCUA Robinul 1 mg po -- minimal relief Zofran 8 mg ODT -- "feels better"  Results for orders placed or performed during the hospital encounter of 01/09/18 (from the past 24 hour(s))  Urinalysis, Routine w reflex microscopic     Status: Abnormal   Collection Time: 01/09/18  9:34 AM  Result Value Ref Range   Color, Urine YELLOW YELLOW   APPearance CLEAR CLEAR   Specific Gravity, Urine 1.003 (L) 1.005 - 1.030   pH 7.0 5.0 - 8.0   Glucose, UA NEGATIVE NEGATIVE mg/dL   Hgb urine dipstick MODERATE (A) NEGATIVE   Bilirubin Urine NEGATIVE NEGATIVE   Ketones, ur NEGATIVE NEGATIVE mg/dL   Protein, ur NEGATIVE NEGATIVE mg/dL   Nitrite NEGATIVE NEGATIVE   Leukocytes, UA SMALL (A) NEGATIVE   RBC / HPF 0-5 0 - 5 RBC/hpf   WBC, UA 11-20 0 - 5 WBC/hpf   Bacteria, UA RARE (A) NONE SEEN   Squamous Epithelial / LPF 6-10 0 - 5    Assessment and Plan  Ptyalism  - Rx for Robinul 1 mg po TID - Information provided on ptyalism   Nausea and vomiting during pregnancy  - Rx for Phenergan 25 mg vaginal hs - Rx for Reglan 10 mg QID - Information provided on N/V in pregnancy   Gastroesophageal reflux  disease without esophagitis  - Rx for Zantac 150 mg BID Intractable migraine without aura and without status migrainosus - Plan: Discharge patient  Urinary tract infection affecting pregnancy  - Patient notified of (+) UCx from last office visit / she states she has not be given any Rx for UTI in pregnancy - Rx for Macrobid 100 mg BID (d/t PCN allergy) - Information provided on UTI in pregnancy   - Discharge patient - Pregnancy Verification Letter given  - Schedule [facility] with [facility] as planned once Medicaid application completed    Laury Deep, MSN, CNM 01/09/2018, 9:23 AM

## 2018-01-09 NOTE — Discharge Instructions (Signed)

## 2018-01-22 DIAGNOSIS — Z3201 Encounter for pregnancy test, result positive: Secondary | ICD-10-CM | POA: Diagnosis not present

## 2018-01-22 DIAGNOSIS — N911 Secondary amenorrhea: Secondary | ICD-10-CM | POA: Diagnosis not present

## 2018-01-23 ENCOUNTER — Encounter: Payer: Self-pay | Admitting: Family Medicine

## 2018-01-23 DIAGNOSIS — Z3490 Encounter for supervision of normal pregnancy, unspecified, unspecified trimester: Secondary | ICD-10-CM | POA: Insufficient documentation

## 2018-02-01 DIAGNOSIS — O26891 Other specified pregnancy related conditions, first trimester: Secondary | ICD-10-CM | POA: Diagnosis not present

## 2018-02-01 DIAGNOSIS — Z3A10 10 weeks gestation of pregnancy: Secondary | ICD-10-CM | POA: Diagnosis not present

## 2018-02-14 DIAGNOSIS — Z3689 Encounter for other specified antenatal screening: Secondary | ICD-10-CM | POA: Diagnosis not present

## 2018-02-14 DIAGNOSIS — Z3A12 12 weeks gestation of pregnancy: Secondary | ICD-10-CM | POA: Diagnosis not present

## 2018-02-14 DIAGNOSIS — Z3682 Encounter for antenatal screening for nuchal translucency: Secondary | ICD-10-CM | POA: Diagnosis not present

## 2018-02-14 LAB — OB RESULTS CONSOLE ANTIBODY SCREEN: Antibody Screen: NEGATIVE

## 2018-02-14 LAB — OB RESULTS CONSOLE GC/CHLAMYDIA
Chlamydia: NEGATIVE
Gonorrhea: NEGATIVE

## 2018-02-14 LAB — OB RESULTS CONSOLE ABO/RH: RH Type: POSITIVE

## 2018-02-14 LAB — OB RESULTS CONSOLE RPR: RPR: NONREACTIVE

## 2018-02-14 LAB — OB RESULTS CONSOLE HEPATITIS B SURFACE ANTIGEN: Hepatitis B Surface Ag: NEGATIVE

## 2018-02-14 LAB — OB RESULTS CONSOLE HIV ANTIBODY (ROUTINE TESTING): HIV: NONREACTIVE

## 2018-02-14 LAB — OB RESULTS CONSOLE RUBELLA ANTIBODY, IGM: Rubella: IMMUNE

## 2018-02-18 DIAGNOSIS — K117 Disturbances of salivary secretion: Secondary | ICD-10-CM | POA: Diagnosis not present

## 2018-02-18 DIAGNOSIS — Z368A Encounter for antenatal screening for other genetic defects: Secondary | ICD-10-CM | POA: Diagnosis not present

## 2018-02-18 DIAGNOSIS — Z3A12 12 weeks gestation of pregnancy: Secondary | ICD-10-CM | POA: Diagnosis not present

## 2018-02-18 DIAGNOSIS — Z3481 Encounter for supervision of other normal pregnancy, first trimester: Secondary | ICD-10-CM | POA: Diagnosis not present

## 2018-02-18 DIAGNOSIS — Z720 Tobacco use: Secondary | ICD-10-CM | POA: Diagnosis not present

## 2018-02-18 DIAGNOSIS — Z113 Encounter for screening for infections with a predominantly sexual mode of transmission: Secondary | ICD-10-CM | POA: Diagnosis not present

## 2018-02-18 DIAGNOSIS — O2341 Unspecified infection of urinary tract in pregnancy, first trimester: Secondary | ICD-10-CM | POA: Diagnosis not present

## 2018-02-18 DIAGNOSIS — K219 Gastro-esophageal reflux disease without esophagitis: Secondary | ICD-10-CM | POA: Diagnosis not present

## 2018-02-18 DIAGNOSIS — R51 Headache: Secondary | ICD-10-CM | POA: Diagnosis not present

## 2018-02-18 DIAGNOSIS — A609 Anogenital herpesviral infection, unspecified: Secondary | ICD-10-CM | POA: Diagnosis not present

## 2018-02-19 ENCOUNTER — Inpatient Hospital Stay (HOSPITAL_COMMUNITY)
Admission: AD | Admit: 2018-02-19 | Discharge: 2018-02-19 | Disposition: A | Discharge status: Home / Self Care | Payer: Medicaid Other | Source: Ambulatory Visit | Attending: Obstetrics and Gynecology | Admitting: Obstetrics and Gynecology

## 2018-02-19 ENCOUNTER — Encounter (HOSPITAL_COMMUNITY): Payer: Self-pay | Admitting: *Deleted

## 2018-02-19 DIAGNOSIS — O26892 Other specified pregnancy related conditions, second trimester: Secondary | ICD-10-CM | POA: Diagnosis not present

## 2018-02-19 DIAGNOSIS — Z3A16 16 weeks gestation of pregnancy: Secondary | ICD-10-CM | POA: Insufficient documentation

## 2018-02-19 DIAGNOSIS — N3000 Acute cystitis without hematuria: Secondary | ICD-10-CM

## 2018-02-19 DIAGNOSIS — Z87891 Personal history of nicotine dependence: Secondary | ICD-10-CM | POA: Insufficient documentation

## 2018-02-19 DIAGNOSIS — G44201 Tension-type headache, unspecified, intractable: Secondary | ICD-10-CM | POA: Insufficient documentation

## 2018-02-19 DIAGNOSIS — O2312 Infections of bladder in pregnancy, second trimester: Secondary | ICD-10-CM | POA: Diagnosis not present

## 2018-02-19 DIAGNOSIS — M549 Dorsalgia, unspecified: Secondary | ICD-10-CM

## 2018-02-19 DIAGNOSIS — Z882 Allergy status to sulfonamides status: Secondary | ICD-10-CM | POA: Insufficient documentation

## 2018-02-19 DIAGNOSIS — Z880 Allergy status to penicillin: Secondary | ICD-10-CM | POA: Insufficient documentation

## 2018-02-19 LAB — COMPREHENSIVE METABOLIC PANEL
ALT: 16 U/L (ref 0–44)
AST: 13 U/L — ABNORMAL LOW (ref 15–41)
Albumin: 3.3 g/dL — ABNORMAL LOW (ref 3.5–5.0)
Alkaline Phosphatase: 57 U/L (ref 38–126)
Anion gap: 11 (ref 5–15)
BUN: 8 mg/dL (ref 6–20)
CO2: 21 mmol/L — ABNORMAL LOW (ref 22–32)
Calcium: 9.1 mg/dL (ref 8.9–10.3)
Chloride: 102 mmol/L (ref 98–111)
Creatinine, Ser: 0.56 mg/dL (ref 0.44–1.00)
GFR calc Af Amer: >60 mL/min (ref >60)
GFR calc non Af Amer: >60 mL/min (ref >60)
Glucose, Bld: 106 mg/dL — ABNORMAL HIGH (ref 70–99)
Potassium: 3.5 mmol/L (ref 3.5–5.1)
Sodium: 134 mmol/L — ABNORMAL LOW (ref 135–145)
Total Bilirubin: 0.3 mg/dL (ref 0.3–1.2)
Total Protein: 6.4 g/dL — ABNORMAL LOW (ref 6.5–8.1)

## 2018-02-19 LAB — URINALYSIS, ROUTINE W REFLEX MICROSCOPIC
Bilirubin Urine: NEGATIVE
Glucose, UA: NEGATIVE mg/dL
Ketones, ur: NEGATIVE mg/dL
Nitrite: NEGATIVE
Protein, ur: NEGATIVE mg/dL
Specific Gravity, Urine: 1.012 (ref 1.005–1.030)
pH: 8 (ref 5.0–8.0)

## 2018-02-19 LAB — CBC
HCT: 32.9 % — ABNORMAL LOW (ref 36.0–46.0)
Hemoglobin: 11.1 g/dL — ABNORMAL LOW (ref 12.0–15.0)
MCH: 27.5 pg (ref 26.0–34.0)
MCHC: 33.7 g/dL (ref 30.0–36.0)
MCV: 81.4 fL (ref 78.0–100.0)
Platelets: 273 10*3/uL (ref 150–400)
RBC: 4.04 MIL/uL (ref 3.87–5.11)
RDW: 14 % (ref 11.5–15.5)
WBC: 9.1 10*3/uL (ref 4.0–10.5)

## 2018-02-19 MED ORDER — NITROFURANTOIN MONOHYD MACRO 100 MG PO CAPS: 100.0000 mg | ORAL_CAPSULE | Freq: Two times a day (BID) | ORAL | 0 refills | Status: DC

## 2018-02-19 MED ORDER — DEXAMETHASONE SODIUM PHOSPHATE 10 MG/ML IJ SOLN
10.0000 mg | Freq: Once | INTRAMUSCULAR | Status: AC
  Administered 2018-02-19: 10 mg via INTRAVENOUS

## 2018-02-19 MED ORDER — METOCLOPRAMIDE HCL 5 MG/ML IJ SOLN
10.0000 mg | Freq: Once | INTRAMUSCULAR | Status: AC
  Administered 2018-02-19: 10 mg via INTRAVENOUS

## 2018-02-19 MED ORDER — BUTALBITAL-APAP-CAFFEINE 50-325-40 MG PO TABS: 1.0000 | ORAL_TABLET | Freq: Four times a day (QID) | ORAL | 0 refills | Status: DC | PRN

## 2018-02-19 MED ORDER — DIPHENHYDRAMINE HCL 50 MG/ML IJ SOLN
25.0000 mg | Freq: Once | INTRAMUSCULAR | Status: AC
  Administered 2018-02-19: 25 mg via INTRAVENOUS

## 2018-02-19 MED ORDER — SODIUM CHLORIDE 0.9 % IV SOLN
INTRAVENOUS | Status: DC
  Administered 2018-02-19: 02:00:00 via INTRAVENOUS

## 2018-02-19 MED ORDER — METOCLOPRAMIDE HCL 5 MG/ML IJ SOLN
INTRAMUSCULAR | Status: AC
  Filled 2018-02-19: qty 2

## 2018-02-19 MED ORDER — DIPHENHYDRAMINE HCL 50 MG/ML IJ SOLN
INTRAMUSCULAR | Status: AC
  Filled 2018-02-19: qty 1

## 2018-02-19 MED ORDER — DEXAMETHASONE SODIUM PHOSPHATE 10 MG/ML IJ SOLN
INTRAMUSCULAR | Status: AC
  Filled 2018-02-19: qty 1

## 2018-02-19 MED ORDER — NITROFURANTOIN MONOHYD MACRO 100 MG PO CAPS
100.0000 mg | ORAL_CAPSULE | Freq: Once | ORAL | Status: AC
  Administered 2018-02-19: 100 mg via ORAL

## 2018-02-19 MED ORDER — NITROFURANTOIN MONOHYD MACRO 100 MG PO CAPS
ORAL_CAPSULE | ORAL | Status: AC
  Filled 2018-02-19: qty 1

## 2018-02-19 NOTE — MAU Provider Note (Addendum)
Chief Complaint: Emesis   First Provider Initiated Contact with Patient 02/19/18 0131     SUBJECTIVE HPI: Kathryn Fox is a 27 y.o. G2P1001 at [redacted]w[redacted]d by LMP who presents to maternity admissions reporting Headache and back pain. She reports back pain has been present for the past week, reports back pain is specific to her left side that radiates up her back to her head. She reports headache started tonight around 1800, rates pain 9/10. Was in the office yesterday afternoon and told she had a UTI. She reports she went to pharmacy to pick up prescription for UTI and Nausea but UTI medication was not there. She rates pain 9/10-has taken Excedrin around 1800 with no relief. She reports relief of nausea with Diclegis that was prescribed. She denies abdominal pain, vaginal bleeding, or vaginal itching/burning.  Past Medical History:  Diagnosis Date  . Chronic abdominal pain   . Depression    hx of, no current problems  . Headache   . Infection    UTI  . 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 Colposcopy in April, 2015 clinically normal. Recommend repeat her test Pap in one year.   . SVD (spontaneous vaginal delivery) 01/17/2015  . Vaginal Pap smear, abnormal    bx, f/u ok since   Past Surgical History:  Procedure Laterality Date  . BREAST FIBROADENOMA SURGERY     Social History   Socioeconomic History  . Marital status: Single    Spouse name: Not on file  . Number of children: Not on file  . Years of education: Not on file  . Highest education level: Not on file  Occupational History  . Not on file  Social Needs  . Financial resource strain: Not on file  . Food insecurity:    Worry: Not on file    Inability: Not on file  . Transportation needs:    Medical: Not on file    Non-medical: Not on file  Tobacco Use  . Smoking status: Former Smoker    Packs/day: 0.00    Types: Cigars    Last attempt to quit:  05/24/2014    Years since quitting: 3.7  . Smokeless tobacco: Current User  . Tobacco comment: also smoked hookah  Substance and Sexual Activity  . Alcohol use: No  . Drug use: No  . Sexual activity: Yes    Birth control/protection: None  Lifestyle  . Physical activity:    Days per week: Not on file    Minutes per session: Not on file  . Stress: Not on file  Relationships  . Social connections:    Talks on phone: Not on file    Gets together: Not on file    Attends religious service: Not on file    Active member of club or organization: Not on file    Attends meetings of clubs or organizations: Not on file    Relationship status: Not on file  . Intimate partner violence:    Fear of current or ex partner: Not on file    Emotionally abused: Not on file    Physically abused: Not on file    Forced sexual activity: Not on file  Other Topics Concern  . Not on file  Social History Narrative  . Not on file   No current facility-administered medications on file prior to encounter.    Current Outpatient Medications on File Prior to Encounter  Medication Sig Dispense  Refill  . butalbital-acetaminophen-caffeine (FIORICET, ESGIC) 50-325-40 MG tablet Take 1-2 tablets by mouth every 6 (six) hours as needed for headache. 20 tablet 0  . glycopyrrolate (ROBINUL) 1 MG tablet Take 1 tablet (1 mg total) by mouth 3 (three) times daily. 90 tablet 0  . metoCLOPramide (REGLAN) 10 MG tablet Take 1 tablet (10 mg total) by mouth every 6 (six) hours. 30 tablet 0  . nitrofurantoin, macrocrystal-monohydrate, (MACROBID) 100 MG capsule Take 1 capsule (100 mg total) by mouth 2 (two) times daily. 10 capsule 0  . promethazine (PHENERGAN) 25 MG tablet Take 1 tablet (25 mg total) by mouth every 6 (six) hours as needed for nausea or vomiting. Insert vaginally at bedtime 30 tablet 3  . ranitidine (ZANTAC) 150 MG tablet Take 1 tablet (150 mg total) by mouth 2 (two) times daily. 60 tablet 0   Allergies  Allergen  Reactions  . Mushroom Extract Complex Anaphylaxis    Facial swelling  . Peanut-Containing Drug Products Anaphylaxis  . Penicillins Anaphylaxis  . Milk-Related Compounds Nausea And Vomiting  . Septra [Sulfamethoxazole-Trimethoprim] Hives    ROS:  Review of Systems  Respiratory: Negative.   Cardiovascular: Negative.   Gastrointestinal: Negative.   Genitourinary: Positive for dysuria and flank pain. Negative for vaginal bleeding and vaginal discharge.  Musculoskeletal: Positive for back pain.  Neurological: Positive for headaches. Negative for dizziness, weakness and light-headedness.   I have reviewed patient's Past Medical Hx, Surgical Hx, Family Hx, Social Hx, medications and allergies.   Physical Exam   Patient Vitals for the past 24 hrs:  BP Temp Temp src Pulse Resp Height Weight  02/19/18 0313 (!) 116/56 98.8 F (37.1 C) Oral 78 15 - -  02/19/18 0121 118/61 99.1 F (37.3 C) Oral 75 16 5\' 5"  (1.651 m) 156 lb (70.8 kg)   Constitutional: Well-developed, well-nourished female in no acute distress.  Cardiovascular: normal rate Respiratory: normal effort GI: Abd soft, non-tender. Pos BS x 4 MS: Extremities nontender, no edema, normal ROM Neurologic: Alert and oriented x 4.  GU: Pos CVAT on left. PELVIC EXAM: deferred  FHT 162 by doppler  LAB RESULTS Results for orders placed or performed during the hospital encounter of 02/19/18 (from the past 24 hour(s))  Urinalysis, Routine w reflex microscopic     Status: Abnormal   Collection Time: 02/19/18 12:50 AM  Result Value Ref Range   Color, Urine YELLOW YELLOW   APPearance CLEAR CLEAR   Specific Gravity, Urine 1.012 1.005 - 1.030   pH 8.0 5.0 - 8.0   Glucose, UA NEGATIVE NEGATIVE mg/dL   Hgb urine dipstick LARGE (A) NEGATIVE   Bilirubin Urine NEGATIVE NEGATIVE   Ketones, ur NEGATIVE NEGATIVE mg/dL   Protein, ur NEGATIVE NEGATIVE mg/dL   Nitrite NEGATIVE NEGATIVE   Leukocytes, UA SMALL (A) NEGATIVE   RBC / HPF 11-20 0 -  5 RBC/hpf   WBC, UA 0-5 0 - 5 WBC/hpf   Bacteria, UA RARE (A) NONE SEEN   Squamous Epithelial / LPF 0-5 0 - 5   Mucus PRESENT   CBC     Status: Abnormal   Collection Time: 02/19/18  1:55 AM  Result Value Ref Range   WBC 9.1 4.0 - 10.5 K/uL   RBC 4.04 3.87 - 5.11 MIL/uL   Hemoglobin 11.1 (L) 12.0 - 15.0 g/dL   HCT 32.9 (L) 36.0 - 46.0 %   MCV 81.4 78.0 - 100.0 fL   MCH 27.5 26.0 - 34.0 pg   MCHC 33.7 30.0 -  36.0 g/dL   RDW 14.0 11.5 - 15.5 %   Platelets 273 150 - 400 K/uL  Comprehensive metabolic panel     Status: Abnormal   Collection Time: 02/19/18  1:55 AM  Result Value Ref Range   Sodium 134 (L) 135 - 145 mmol/L   Potassium 3.5 3.5 - 5.1 mmol/L   Chloride 102 98 - 111 mmol/L   CO2 21 (L) 22 - 32 mmol/L   Glucose, Bld 106 (H) 70 - 99 mg/dL   BUN 8 6 - 20 mg/dL   Creatinine, Ser 0.56 0.44 - 1.00 mg/dL   Calcium 9.1 8.9 - 10.3 mg/dL   Total Protein 6.4 (L) 6.5 - 8.1 g/dL   Albumin 3.3 (L) 3.5 - 5.0 g/dL   AST 13 (L) 15 - 41 U/L   ALT 16 0 - 44 U/L   Alkaline Phosphatase 57 38 - 126 U/L   Total Bilirubin 0.3 0.3 - 1.2 mg/dL   GFR calc non Af Amer >60 >60 mL/min   GFR calc Af Amer >60 >60 mL/min   Anion gap 11 5 - 15    MAU Management/MDM: Orders Placed This Encounter  Procedures  . Culture, OB Urine  . Urinalysis, Routine w reflex microscopic  . CBC  . Comprehensive metabolic panel   Culture pending  CBC- WNL  CMP- WNL   Meds ordered this encounter  Medications  . DISCONTD: 0.9 %  sodium chloride infusion  . AND Linked Order Group   . diphenhydrAMINE (BENADRYL) injection 25 mg   . metoCLOPramide (REGLAN) injection 10 mg   . dexamethasone (DECADRON) injection 10 mg  . nitrofurantoin (macrocrystal-monohydrate) (MACROBID) capsule 100 mg  . nitrofurantoin, macrocrystal-monohydrate, (MACROBID) 100 MG capsule    Sig: Take 1 capsule (100 mg total) by mouth 2 (two) times daily.    Dispense:  13 capsule    Refill:  0    Order Specific Question:   Supervising  Provider    Answer:   Aletha Halim [id number]  . butalbital-acetaminophen-caffeine (FIORICET, ESGIC) 50-325-40 MG tablet    Sig: Take 1-2 tablets by mouth every 6 (six) hours as needed for headache.    Dispense:  20 tablet    Refill:  0    Order Specific Question:   Supervising Provider    Answer:   Aletha Halim [[id number]]   Treatments in MAU included Headache cocktail including decadron, reglan and benadryl. First dose of Macrobid for UTI given in MAU.   Patient reports headache and back pain is better with treatment, reports pain has decreased from 9/10 to 6/10. Patient initial asleep for reassessment and easily arouse when name was called. Pt stable prior to discharge. Pt reports she is going upstairs to sleep because friend just got admitted to 3rd floor.   C/W Dr Willis Modena for assessment and exam finding. Okay to discharge with prescription for UTI.   Pt discharged.   ASSESSMENT 1. Acute cystitis without hematuria   2. Back pain during pregnancy in second trimester   3. Acute intractable tension-type headache     PLAN Discharge home Rx for Macrobid and refill on fioricet  Return to MAU as needed for emergencies  Follow up as scheduled for prenatal appointments    Allergies as of 02/19/2018      Reactions   Mushroom Extract Complex Anaphylaxis   Facial swelling   Peanut-containing Drug Products Anaphylaxis   Penicillins Anaphylaxis   Milk-related Compounds Nausea And Vomiting   Septra [sulfamethoxazole-trimethoprim] Hives  Medication List    TAKE these medications   butalbital-acetaminophen-caffeine 50-325-40 MG tablet Commonly known as:  FIORICET, ESGIC Take 1-2 tablets by mouth every 6 (six) hours as needed for headache.   glycopyrrolate 1 MG tablet Commonly known as:  ROBINUL Take 1 tablet (1 mg total) by mouth 3 (three) times daily.   metoCLOPramide 10 MG tablet Commonly known as:  REGLAN Take 1 tablet (10 mg total) by mouth every 6 (six) hours.    nitrofurantoin (macrocrystal-monohydrate) 100 MG capsule Commonly known as:  MACROBID Take 1 capsule (100 mg total) by mouth 2 (two) times daily. What changed:  Another medication with the same name was added. Make sure you understand how and when to take each.   nitrofurantoin (macrocrystal-monohydrate) 100 MG capsule Commonly known as:  MACROBID Take 1 capsule (100 mg total) by mouth 2 (two) times daily. What changed:  You were already taking a medication with the same name, and this prescription was added. Make sure you understand how and when to take each.   promethazine 25 MG tablet Commonly known as:  PHENERGAN Take 1 tablet (25 mg total) by mouth every 6 (six) hours as needed for nausea or vomiting. Insert vaginally at bedtime   ranitidine 150 MG tablet Commonly known as:  ZANTAC Take 1 tablet (150 mg total) by mouth 2 (two) times daily.       Darrol Poke  Certified Nurse-Midwife 02/19/2018  3:41 AM

## 2018-02-19 NOTE — MAU Note (Signed)
Pt woke with fever and vomiting and went to work. She felt terrible and went home to sleep til her appt. At appt today, she was told she had a UTI. When she went to pick up Rx she realized the prescription was not there.She felt worse throughout the nigh. Called the office and the answering service informed pt she would get a return call in 20 min but she did not; So  she decided to come in to MAU.

## 2018-02-21 LAB — CULTURE, OB URINE: Culture: 20000 — AB

## 2018-03-04 ENCOUNTER — Encounter: Payer: Self-pay | Admitting: Gastroenterology

## 2018-03-05 ENCOUNTER — Ambulatory Visit: Payer: Medicaid Other | Admitting: Neurology

## 2018-04-09 DIAGNOSIS — Z3689 Encounter for other specified antenatal screening: Secondary | ICD-10-CM | POA: Diagnosis not present

## 2018-04-09 DIAGNOSIS — Z3A19 19 weeks gestation of pregnancy: Secondary | ICD-10-CM | POA: Diagnosis not present

## 2018-04-09 DIAGNOSIS — Z363 Encounter for antenatal screening for malformations: Secondary | ICD-10-CM | POA: Diagnosis not present

## 2018-04-11 ENCOUNTER — Other Ambulatory Visit: Payer: Self-pay

## 2018-04-11 ENCOUNTER — Ambulatory Visit: Payer: Medicaid Other | Admitting: Neurology

## 2018-04-11 ENCOUNTER — Encounter: Payer: Self-pay | Admitting: Neurology

## 2018-04-11 VITALS — BP 113/61 | HR 88 | Resp 16 | Ht 65.0 in | Wt 166.0 lb

## 2018-04-11 DIAGNOSIS — G43019 Migraine without aura, intractable, without status migrainosus: Secondary | ICD-10-CM

## 2018-04-11 DIAGNOSIS — G8929 Other chronic pain: Secondary | ICD-10-CM

## 2018-04-11 DIAGNOSIS — M5481 Occipital neuralgia: Secondary | ICD-10-CM | POA: Insufficient documentation

## 2018-04-11 DIAGNOSIS — M542 Cervicalgia: Secondary | ICD-10-CM | POA: Diagnosis not present

## 2018-04-11 DIAGNOSIS — Z3490 Encounter for supervision of normal pregnancy, unspecified, unspecified trimester: Secondary | ICD-10-CM

## 2018-04-11 NOTE — Progress Notes (Signed)
[facility]  PATIENT: Dajane Valli Stefanelli DOB: 12-Jul-1991  REFERRING DOCTOR OR PCP:  Alferd Patee, MD SOURCE: Patient, notes from Dr. Rica Koyanagi, lab results  _________________________________   HISTORICAL  CHIEF COMPLAINT:  Chief Complaint  Patient presents with  . Headache    Ryllie is here for eval of h/a's onset age 27. She is currenlty 5 mos. pregnant. Sts. has had x-rays of her neck and was told her spine is straight where there should be a curve. Sts. h/a's start in her neck and radiatie into head. Sts. has tried Flexeril, Butalbital, ? Sumitriptan in the past without relief. Sts. tpi's have helped in the past./fim    HISTORY OF PRESENT ILLNESS:  I had the pleasure seeing patient, Evalee Schooley, and [facility] for a neurologic consultation regarding her headaches.      She is a 27 year old woman who began to experience headaches frequently around age 27.  She is currently 5 months pregnant.    Hr headaches starts bilaterally at the neck and sometimes one side and radiates forward.   When intense, the pain also goes down her back.   When pain is severe it is throbbing and associated with nausea and vomiting.   She has photophobia and phonophobia when HA is more intense.    She notes moving worsens the pain and resting helps.    HA is also worse when her GERD is bothering her more.     Headaches are more intense and more frequent during the pregnancy.    Before her pregnanccy, they were occurring several days a week and would last > 4 hours a day when present.   Now they are daily.     Trigger point injections helped her the most in the past.   Flexeril had not helped.    Excedrin sometimes helps the headache but Tylenol does not help.   She does not think she has ever tried Imitrex or other triptan.      I reviewed lab and x-ray reports.  Cervical spine performed 2011 was read as showing the head tilted to the right but otherwise normal.   Recent labs from OB/GYN show reduce albumin and mild anemia.     I also personally reviewed the CT scan of the head dated 03/20/2008.  The brain appears normal.  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  There was no sinusitis.  REVIEW OF SYSTEMS: Constitutional: No fevers, chills, sweats, or change in appetite.  She is 5 months pregnant. Eyes: No visual changes, double vision, eye pain Ear, nose and throat: No hearing loss, ear pain, nasal congestion, sore throat Cardiovascular: No chest pain, palpitations Respiratory: No shortness of breath at rest or with exertion.   No wheezes GastrointestinaI: No nausea, vomiting, diarrhea, abdominal pain, fecal incontinence Genitourinary: No dysuria, urinary retention or frequency.  No nocturia. Musculoskeletal: No neck pain, back pain Integumentary: No rash, pruritus, skin lesions Neurological: as above Psychiatric: No depression at this time.  No anxiety Endocrine: No palpitations, diaphoresis, change in appetite, change in weigh or increased thirst Hematologic/Lymphatic: No anemia, purpura, petechiae. Allergic/Immunologic: No itchy/runny eyes, nasal congestion, recent allergic reactions, rashes  ALLERGIES: Allergies  Allergen Reactions  . Mushroom Extract Complex Anaphylaxis    Facial swelling  . Peanut-Containing Drug Products Anaphylaxis  . Penicillins Anaphylaxis  . Milk-Related Compounds Nausea And Vomiting  . Septra [Sulfamethoxazole-Trimethoprim] Hives    HOME MEDICATIONS:  Current Outpatient Medications:  .  pantoprazole (PROTONIX) 40 MG tablet, pantoprazole 40 mg tablet,delayed  release  TAKE 1 TABLET BY MOUTH TWICE A DAY, Disp: , Rfl:  .  Prenatal Vit-Fe Fumarate-FA (PRENATAL VITAMIN PO), Take by mouth., Disp: , Rfl:   PAST MEDICAL HISTORY: Past Medical History:  Diagnosis Date  . Chronic abdominal pain   . Depression    hx of, no current problems  . Headache   . Infection    UTI  . LGSIL (low grade squamous intraepithelial  dysplasia) 10/30/2013   LGSIL on Pap with some potentially high-grade cells. Colposcopy in April, 2015 clinically normal. Recommend repeat her test Pap in one year.   . SVD (spontaneous vaginal delivery) 01/17/2015  . Vaginal Pap smear, abnormal    bx, f/u ok since    PAST SURGICAL HISTORY: Past Surgical History:  Procedure Laterality Date  . BREAST FIBROADENOMA SURGERY      FAMILY HISTORY: Family History  Problem Relation Age of Onset  . Cancer Maternal Aunt        Breast  . Diabetes Maternal Aunt   . Cancer Maternal Grandfather        lung  . Hypertension Mother   . Hyperlipidemia Mother   . Diabetes Mother   . Congestive Heart Failure Mother   . Healthy Father     SOCIAL HISTORY:  Social History   Socioeconomic History  . Marital status: Single    Spouse name: Not on file  . Number of children: Not on file  . Years of education: Not on file  . Highest education level: Not on file  Occupational History  . Not on file  Social Needs  . Financial resource strain: Not on file  . Food insecurity:    Worry: Not on file    Inability: Not on file  . Transportation needs:    Medical: Not on file    Non-medical: Not on file  Tobacco Use  . Smoking status: Former Smoker    Packs/day: 0.00    Types: Cigars    Last attempt to quit: 05/24/2014    Years since quitting: 3.8  . Smokeless tobacco: Current User  . Tobacco comment: also smoked hookah  Substance and Sexual Activity  . Alcohol use: No  . Drug use: No  . Sexual activity: Yes    Birth control/protection: None  Lifestyle  . Physical activity:    Days per week: Not on file    Minutes per session: Not on file  . Stress: Not on file  Relationships  . Social connections:    Talks on phone: Not on file    Gets together: Not on file    Attends religious service: Not on file    Active member of club or organization: Not on file    Attends meetings of clubs or organizations: Not on file    Relationship status:  Not on file  . Intimate partner violence:    Fear of current or ex partner: Not on file    Emotionally abused: Not on file    Physically abused: Not on file    Forced sexual activity: Not on file  Other Topics Concern  . Not on file  Social History Narrative  . Not on file     PHYSICAL EXAM  Vitals:   04/11/18 1013  BP: 113/61  Pulse: 88  Resp: 16  Weight: 166 lb (75.3 kg)  Height: 5\' 5"  (1.651 m)    Body mass index is 27.62 kg/m.   General: The patient is well-developed and well-nourished and in no  acute distress  Eyes:  Funduscopic exam shows normal optic discs and retinal vessels.  Neck: The neck is supple, no carotid bruits are noted.  The neck is nontender.  Cardiovascular: The heart has a regular rate and rhythm with a normal S1 and S2. There were no murmurs, gallops or rubs. Lungs are clear to auscultation.  Skin: Extremities are without significant edema.  Musculoskeletal:  Back is nontender  Neurologic Exam  Mental status: The patient is alert and oriented x 3 at the time of the examination. The patient has apparent normal recent and remote memory, with an apparently normal attention span and concentration ability.   Speech is normal.  Cranial nerves: Extraocular movements are full. Pupils are equal, round, and reactive to light and accomodation.  Visual fields are full.  Facial symmetry is present. There is good facial sensation to soft touch bilaterally.Facial strength is normal.  Trapezius and sternocleidomastoid strength is normal. No dysarthria is noted.  The tongue is midline, and the patient has symmetric elevation of the soft palate. No obvious hearing deficits are noted.  Motor:  Muscle bulk is normal.   Tone is normal. Strength is  5 / 5 in all 4 extremities.   Sensory: She felt reduced right vibratory sensation and reduced left touch an temperature sensation.    Coordination: Cerebellar testing reveals good finger-nose-finger and heel-to-shin  bilaterally.  Gait and station: Station is normal.   Gait is normal. Tandem gait is normal. Romberg is negative.   Reflexes: Deep tendon reflexes are 3 at the knees and 2+ elsewhere.   Plantar responses are flexor.    DIAGNOSTIC DATA (LABS, IMAGING, TESTING) - I reviewed patient records, labs, notes, testing and imaging myself where available.  Lab Results  Component Value Date   WBC 9.1 02/19/2018   HGB 11.1 (L) 02/19/2018   HCT 32.9 (L) 02/19/2018   MCV 81.4 02/19/2018   PLT 273 02/19/2018      Component Value Date/Time   NA 134 (L) 02/19/2018 0155   K 3.5 02/19/2018 0155   CL 102 02/19/2018 0155   CO2 21 (L) 02/19/2018 0155   GLUCOSE 106 (H) 02/19/2018 0155   BUN 8 02/19/2018 0155   CREATININE 0.56 02/19/2018 0155   CREATININE 1.00 10/20/2015 1621   CALCIUM 9.1 02/19/2018 0155   PROT 6.4 (L) 02/19/2018 0155   ALBUMIN 3.3 (L) 02/19/2018 0155   AST 13 (L) 02/19/2018 0155   ALT 16 02/19/2018 0155   ALKPHOS 57 02/19/2018 0155   BILITOT 0.3 02/19/2018 0155   GFRNONAA >60 02/19/2018 0155   GFRNONAA 78 10/20/2015 1621   GFRAA >60 02/19/2018 0155   GFRAA >89 10/20/2015 1621   Lab Results  Component Value Date   CHOL 144 08/22/2013   HDL 52 08/22/2013   LDLCALC 82 08/22/2013   TRIG 48 08/22/2013   CHOLHDL 2.8 08/22/2013   Lab Results  Component Value Date   HGBA1C 5.6 10/20/2015   No results found for: [id number] Lab Results  Component Value Date   TSH 2.409 06/14/2012       ASSESSMENT AND PLAN  Intractable migraine without aura and without status migrainosus  Pregnancy, unspecified gestational age  Neck pain, chronic  Bilateral occipital neuralgia   In summary, Glendi Cisek is a 27 year old woman with a long history of headaches, worse more recently during her pregnancy.  Due to the pregnancy, we are somewhat limited in medications.  I did a bilateral occipital nerve block and bilateral splenius capitis and trapezius  trigger point injections with a  total of 80 mg Depo-Medrol in 5 cc Marcaine.  She tolerated the injections well and pain was much better afterwards.  I discussed with her that there are several medications we could consider when she has delivered if headaches persist.  I have advised her to call us back after she delivers and I would consider adding Topamax or another prophylactic agent and also 1 of the triptan's.  In the meantime, if pain returns she should give [place] a call and we can do it and other occipital nerve block and/or trigger point injection.  Thank you for asking me to see Ms. Vining.  Please let me know if I can be of further assistance with her or other patients in the future.   Florabel Faulks A. Felecia Shelling, MD, [facility] 0/14/1030, 13:14 PM Certified in Neurology, Clinical Neurophysiology, Sleep Medicine, Pain Medicine and Neuroimaging  [facility] [address] [place], [place] [address] [phone]

## 2018-04-18 IMAGING — CR DG CHEST 2V
2 series · 2 of 2 positions shown · non-contrast
Comparison: 02/19/2014

CLINICAL DATA: Reason for exam: CP and coughing with low-grade
fever since last week. No other medical hx provided.

EXAM:
CHEST  2 VIEW

[chest pa]
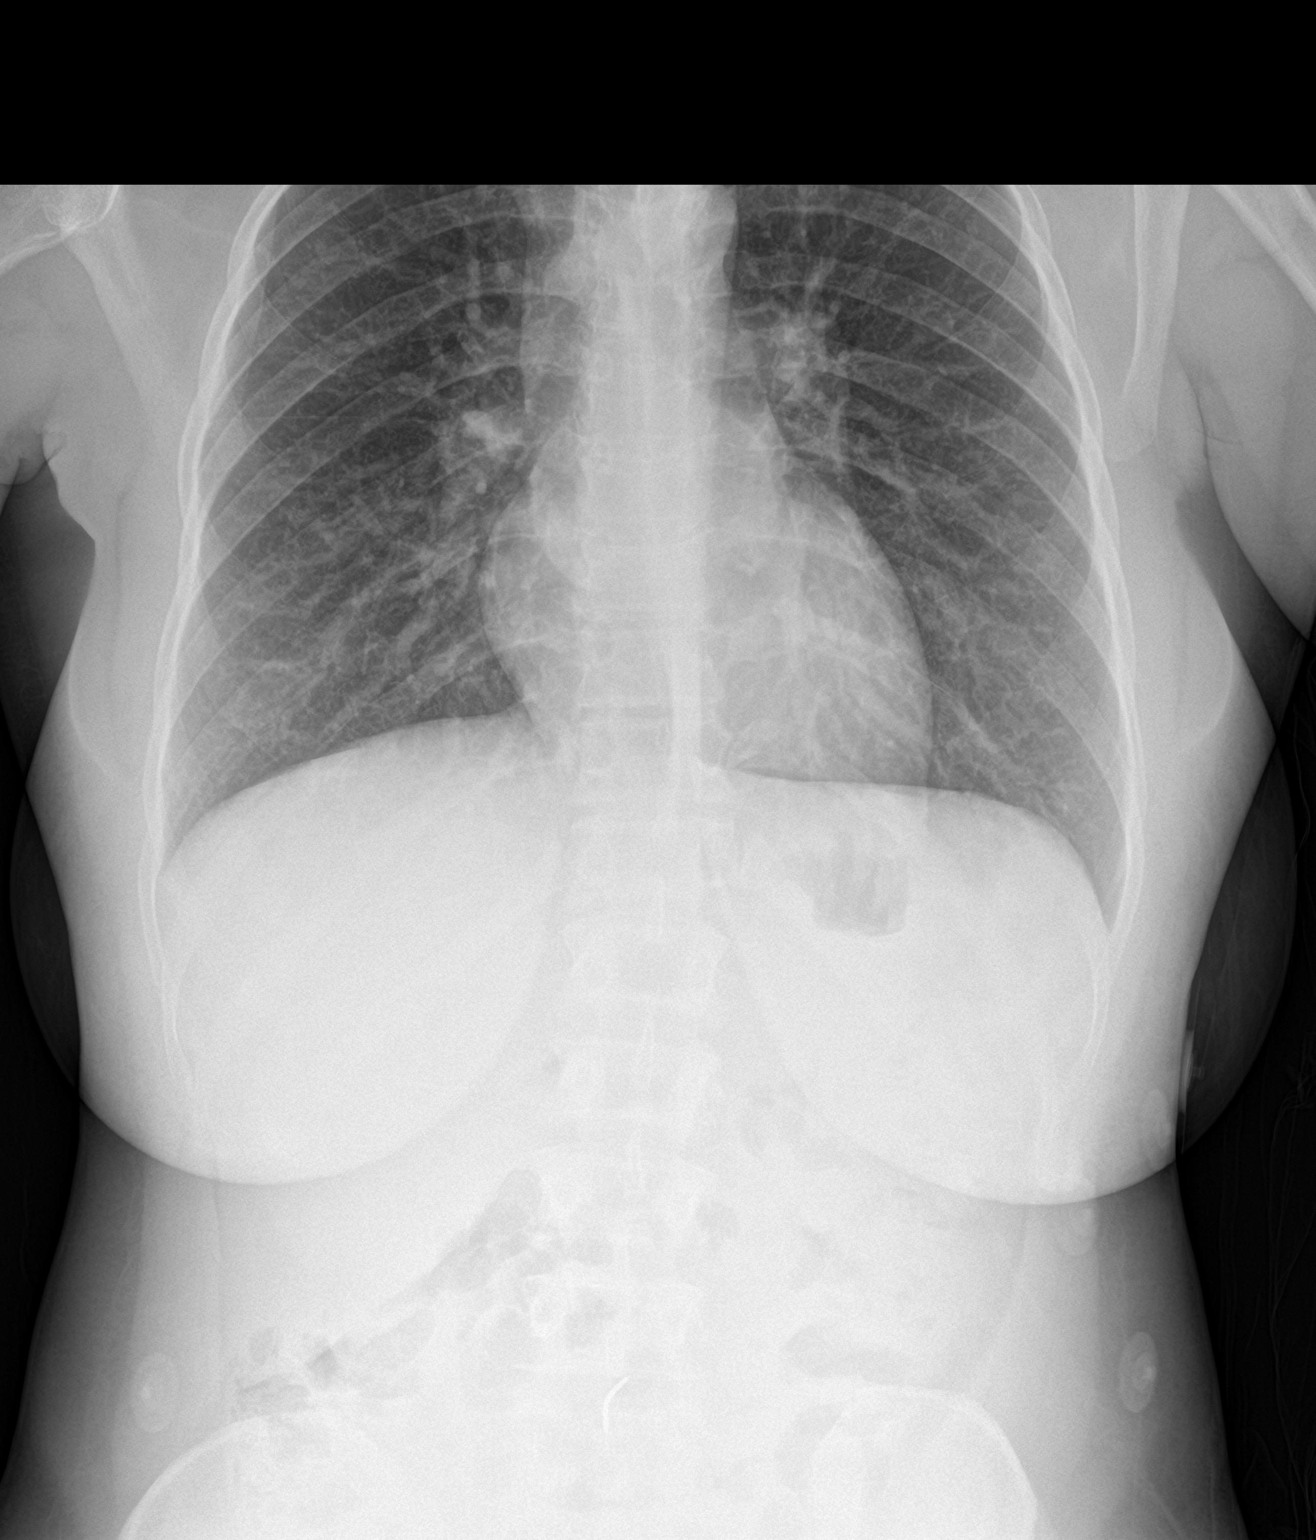

[chest lat]
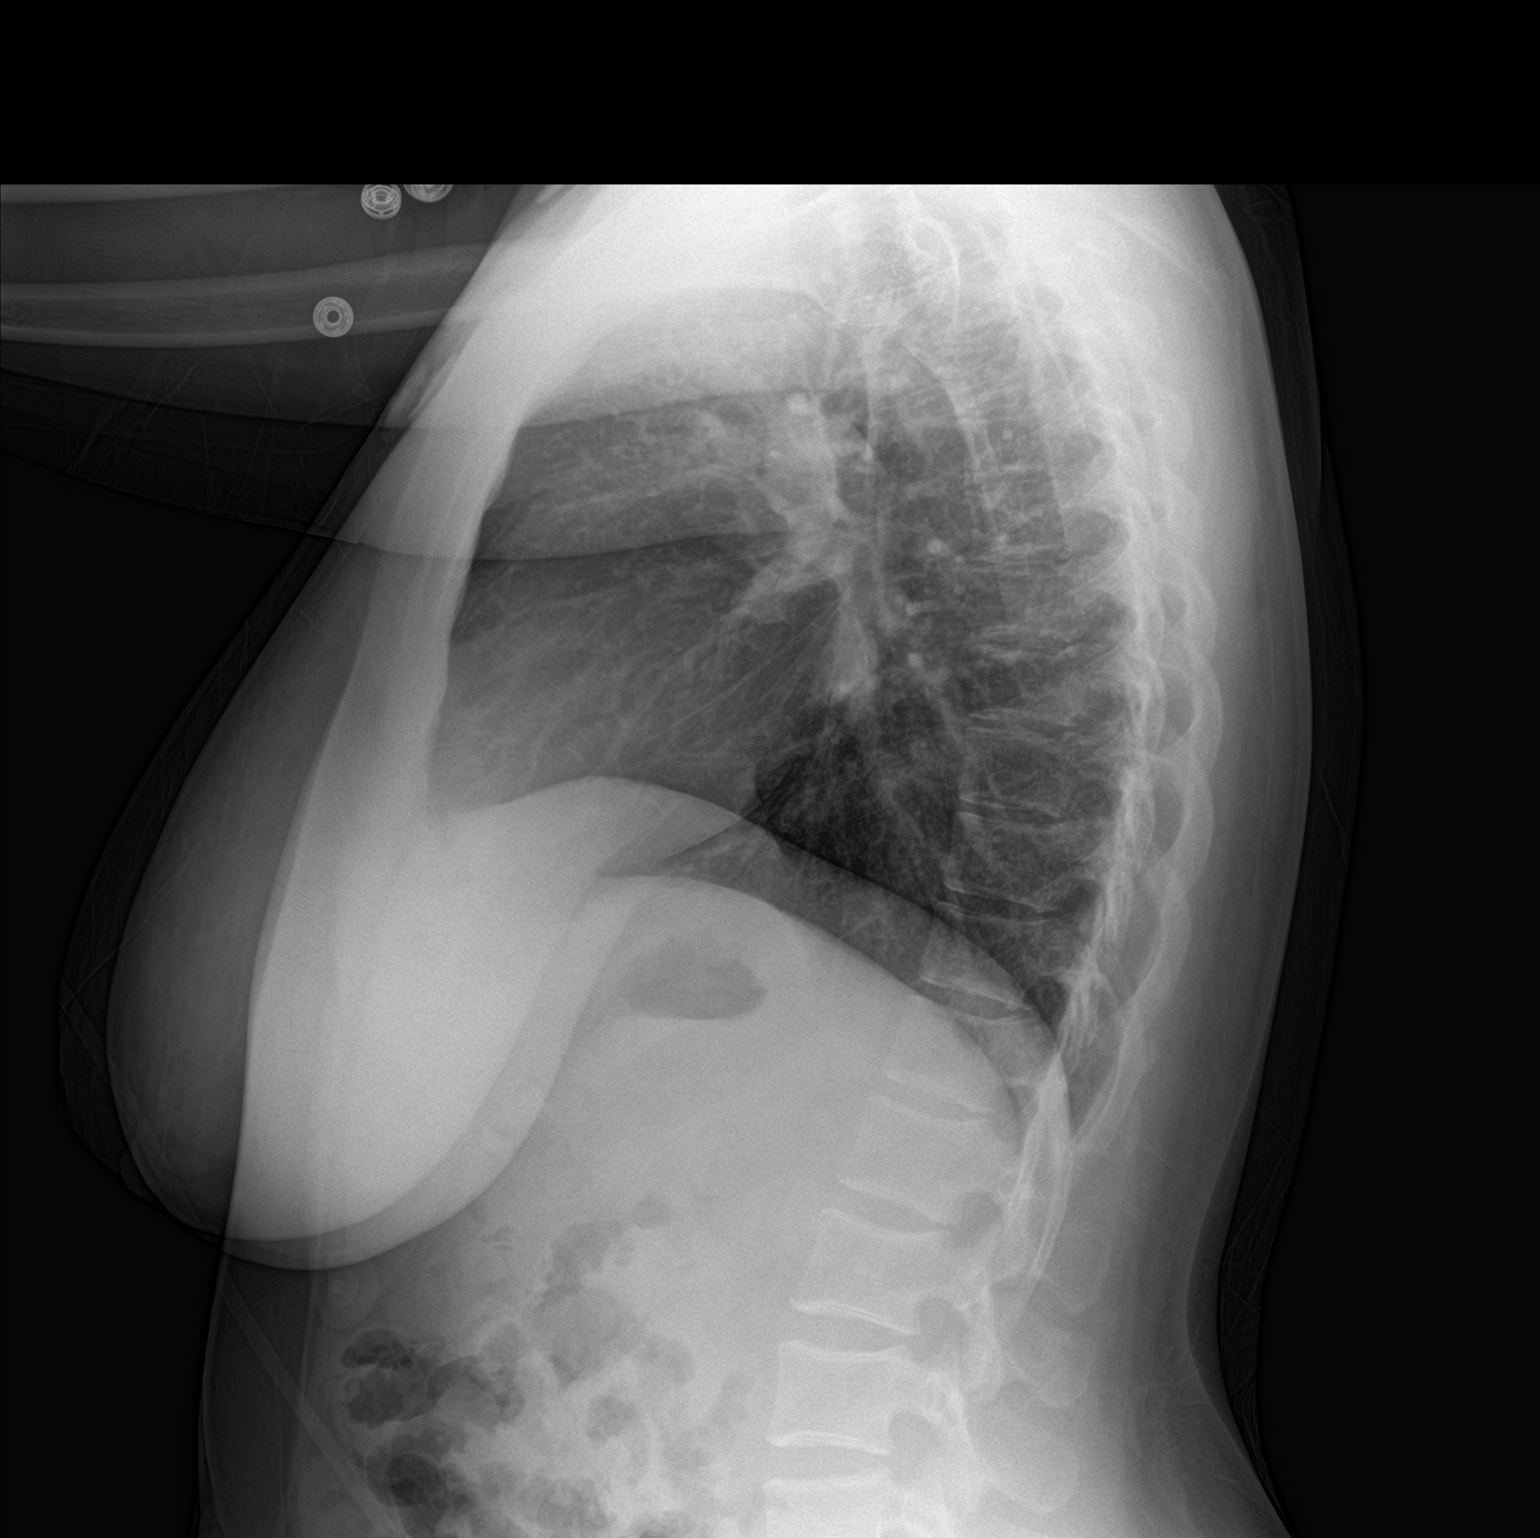

[2 of 2 positions shown; findings below may reference images not displayed]

FINDINGS: The heart size and mediastinal contours are within normal limits.
Both lungs are clear. No pleural effusion or pneumothorax. The
visualized skeletal structures are unremarkable.
IMPRESSION: No active cardiopulmonary disease.

## 2018-04-25 ENCOUNTER — Ambulatory Visit: Payer: Medicaid Other | Admitting: Gastroenterology

## 2018-05-07 DIAGNOSIS — R82998 Other abnormal findings in urine: Secondary | ICD-10-CM | POA: Diagnosis not present

## 2018-05-31 ENCOUNTER — Ambulatory Visit: Payer: Medicaid Other | Admitting: Gastroenterology

## 2018-06-03 DIAGNOSIS — O99342 Other mental disorders complicating pregnancy, second trimester: Secondary | ICD-10-CM | POA: Diagnosis not present

## 2018-06-03 DIAGNOSIS — Z3A27 27 weeks gestation of pregnancy: Secondary | ICD-10-CM | POA: Diagnosis not present

## 2018-06-03 DIAGNOSIS — O98312 Other infections with a predominantly sexual mode of transmission complicating pregnancy, second trimester: Secondary | ICD-10-CM | POA: Diagnosis not present

## 2018-06-03 DIAGNOSIS — Z3689 Encounter for other specified antenatal screening: Secondary | ICD-10-CM | POA: Diagnosis not present

## 2018-06-03 DIAGNOSIS — Z23 Encounter for immunization: Secondary | ICD-10-CM | POA: Diagnosis not present

## 2018-06-08 ENCOUNTER — Other Ambulatory Visit: Payer: Self-pay

## 2018-06-08 ENCOUNTER — Emergency Department (HOSPITAL_COMMUNITY)
Admission: EM | Admit: 2018-06-08 | Discharge: 2018-06-08 | Disposition: A | Discharge status: Home / Self Care | Payer: Medicaid Other | Attending: Emergency Medicine | Admitting: Emergency Medicine

## 2018-06-08 ENCOUNTER — Encounter (HOSPITAL_COMMUNITY): Payer: Self-pay

## 2018-06-08 DIAGNOSIS — O99513 Diseases of the respiratory system complicating pregnancy, third trimester: Secondary | ICD-10-CM | POA: Diagnosis not present

## 2018-06-08 DIAGNOSIS — O99512 Diseases of the respiratory system complicating pregnancy, second trimester: Secondary | ICD-10-CM | POA: Diagnosis not present

## 2018-06-08 DIAGNOSIS — Z87891 Personal history of nicotine dependence: Secondary | ICD-10-CM | POA: Insufficient documentation

## 2018-06-08 DIAGNOSIS — Z3A28 28 weeks gestation of pregnancy: Secondary | ICD-10-CM | POA: Diagnosis not present

## 2018-06-08 DIAGNOSIS — J069 Acute upper respiratory infection, unspecified: Secondary | ICD-10-CM | POA: Insufficient documentation

## 2018-06-08 DIAGNOSIS — Z79899 Other long term (current) drug therapy: Secondary | ICD-10-CM | POA: Insufficient documentation

## 2018-06-08 DIAGNOSIS — B9789 Other viral agents as the cause of diseases classified elsewhere: Secondary | ICD-10-CM | POA: Diagnosis not present

## 2018-06-08 NOTE — ED Provider Notes (Addendum)
[facility] EMERGENCY DEPARTMENT Provider Note   CSN: [id number] Arrival date & time: 06/08/18  2116     History   Chief Complaint Chief Complaint  Patient presents with  . Nasal Congestion  . Generalized Body Aches    HPI Kathryn Fox is a 27 y.o. female.  27 year old female reported to be [redacted] weeks pregnant presents with complaint of sinus congestion x1 week.  Patient states that she is taking OTC mucus relief and Delsym as directed by her OB without relief of her symptoms.  Patient reports feeling tired.  Patient is exposed to her 63-year-old child who had a cold last week.  Patient denies fevers or chills.  She reports mild nonproductive cough with sore throat.  No other complaints or concerns.  Reports normal fetal movement today, no vaginal bleeding or leaking fluids.     Past Medical History:  Diagnosis Date  . Chronic abdominal pain   . Depression    hx of, no current problems  . Headache   . Infection    UTI  . 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 Colposcopy in April, 2015 clinically normal. Recommend repeat her test Pap in one year.   . SVD (spontaneous vaginal delivery) 01/17/2015  . Vaginal Pap smear, abnormal    bx, f/u ok since    Patient Active Problem List   Diagnosis Date Noted  . Bilateral occipital neuralgia 04/11/2018  . Pregnant 01/23/2018  . Ptyalism 01/09/2018  . Nausea and vomiting during pregnancy 01/09/2018  . GERD (gastroesophageal reflux disease) 01/09/2018  . Urinary tract infection affecting pregnancy 01/09/2018  . Tobacco abuse 04/25/2017  . Shortness of breath 01/02/2017  . 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 Depression 05/29/2013  . 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 Neck pain, chronic 02/25/2011  . Migraine headache  09/22/2010    Past Surgical History:  Procedure Laterality Date  . BREAST FIBROADENOMA SURGERY       OB History    Gravida  2   Para  1   Term  1   Preterm      AB      Living  1     SAB      TAB      Ectopic      Multiple  0   Live Births  1            Home Medications    Prior to Admission medications   Medication Sig Start Date End Date Taking? Authorizing Provider  pantoprazole (PROTONIX) 40 MG tablet pantoprazole 40 mg tablet,delayed release  TAKE 1 TABLET BY MOUTH TWICE A DAY    [provider]  Prenatal Vit-Fe Fumarate-FA (PRENATAL VITAMIN PO) Take by mouth.    [provider]    Family History Family History  Problem Relation Age of Onset  . Cancer Maternal Aunt        Breast  . Diabetes Maternal Aunt   . Cancer Maternal Grandfather        lung  . Hypertension Mother   . Hyperlipidemia Mother   . Diabetes Mother   . Congestive Heart Failure Mother   . Healthy Father     Social History Social History   Tobacco Use  . Smoking status: Former Smoker    Packs/day: 0.00    Types:  Cigars    Last attempt to quit: 05/24/2014    Years since quitting: 4.0  . Smokeless tobacco: Current User  . Tobacco comment: also smoked hookah  Substance Use Topics  . Alcohol use: No  . Drug use: No     Allergies   Mushroom extract complex; Peanut-containing drug products; Penicillins; Milk-related compounds; and Septra [sulfamethoxazole-trimethoprim]   Review of Systems Review of Systems  Constitutional: Positive for fatigue. Negative for chills and fever.  HENT: Positive for congestion, sneezing and sore throat. Negative for ear pain, sinus pressure and sinus pain.   Respiratory: Positive for cough. Negative for shortness of breath and wheezing.   Skin: Negative for rash and wound.  Allergic/Immunologic: Negative for immunocompromised state.  All other systems reviewed and are negative.    Physical Exam Updated Vital  Signs BP 133/83 (BP Location: Right Arm)   Pulse 100   Temp 98.6 F (37 C) (Oral)   Resp 20   Ht 5\' 5"  (1.651 m)   Wt 80.7 kg   LMP 10/30/2017   SpO2 99%   BMI 29.62 kg/m   Physical Exam  Constitutional: She is oriented to person, place, and time. She appears well-developed and well-nourished. No distress.  HENT:  Head: Normocephalic and atraumatic.  Right Ear: External ear normal.  Left Ear: External ear normal.  Nose: Mucosal edema present. Right sinus exhibits no maxillary sinus tenderness and no frontal sinus tenderness. Left sinus exhibits no maxillary sinus tenderness and no frontal sinus tenderness.  Mouth/Throat: Oropharynx is clear and moist and mucous membranes are normal. No trismus in the jaw.  Eyes: Conjunctivae are normal. Right eye exhibits no discharge. Left eye exhibits no discharge.  Neck: Neck supple.  Cardiovascular: Normal rate, regular rhythm, normal heart sounds and intact distal pulses.  No murmur heard. Pulmonary/Chest: Effort normal and breath sounds normal. No respiratory distress.  Lymphadenopathy:    She has no cervical adenopathy.  Neurological: She is alert and oriented to person, place, and time.  Skin: Skin is warm and dry. No rash noted. She is not diaphoretic.  Psychiatric: She has a normal mood and affect. Her behavior is normal.  Nursing note and vitals reviewed.    ED Treatments / Results  Labs (all labs ordered are listed, but only abnormal results are displayed) Labs Reviewed - No data to display  EKG None  Radiology No results found.  Procedures Procedures (including critical care time)  Medications Ordered in ED Medications - No data to display   Initial Impression / Assessment and Plan / ED Course  I have reviewed the triage vital signs and the nursing notes.  Pertinent labs & imaging results that were available during my care of the patient were reviewed by me and considered in my medical decision making (see chart  for details).  Clinical Course as of Jun 08 2224  Sat Jun 08, 3261  631 27 year old female presents with complaint of URI symptoms.  Patient is [redacted] weeks pregnant.  Fetal heart tones appropriate, normal fetal movement without leaking of fluids or bleeding.  Is taking cold medicine as directed by her OB however she feels fatigued.  Review of orthostatic vitals, patient is not orthostatic however reports feeling lightheaded or dizzy when standing.  Patient was able to ambulate without assistance to the bathroom with normal steady gait.  Recommend patient continue with medicine as directed by her OB, she may add Zyrtec and Flonase as well as feeling sinus rinse to help with her congestion.  Also recommend coolmist vaporizer at night.  Follow-up with her OB or present to [facility] for worsening symptoms.   [LM]    Clinical Course User Index [LM] Tacy Learn, PA-C   Final Clinical Impressions(s) / ED Diagnoses   Final diagnoses:  Viral upper respiratory tract infection    ED Discharge Orders    None       Tacy Learn, PA-C 06/08/18 2222    Tacy Learn, PA-C 06/08/18 2225    Margette Fast, MD 06/09/18 [phone]

## 2018-06-08 NOTE — ED Triage Notes (Signed)
Pt reports continued cold symptoms: nasal congestion, generalized weakness/body aches, chills for 1 week. Taking OTC medications without relief. Pt [redacted] weeks pregnant.

## 2018-06-08 NOTE — Discharge Instructions (Addendum)
Saline sinus rinse twice daily. Flonase daily. Zyrtec daily. Continue with cold medicine as directed by your Ob. Follow up with your Ob. Go to [facility] for worsening of concerning symptoms.

## 2018-06-08 NOTE — ED Notes (Signed)
ED Provider at bedside. 

## 2018-06-26 ENCOUNTER — Encounter: Payer: Self-pay | Admitting: Nurse Practitioner

## 2018-06-26 ENCOUNTER — Telehealth: Payer: Self-pay | Admitting: Nurse Practitioner

## 2018-06-26 NOTE — Telephone Encounter (Signed)
New referral received from Dr. Marvel Plan for anemia. Pt has been scheduled to see Burns Spain on 12/12 at 11am. Pt aware to arrive 30 minutes early. Letter mailed.

## 2018-07-04 ENCOUNTER — Inpatient Hospital Stay: Payer: Medicaid Other | Attending: Nurse Practitioner | Admitting: Nurse Practitioner

## 2018-07-04 ENCOUNTER — Inpatient Hospital Stay: Payer: Medicaid Other

## 2018-07-04 ENCOUNTER — Encounter: Payer: Self-pay | Admitting: Nurse Practitioner

## 2018-07-04 VITALS — BP 113/69 | HR 101 | Temp 98.4°F | Resp 18 | Ht 65.0 in | Wt 182.9 lb

## 2018-07-04 DIAGNOSIS — D649 Anemia, unspecified: Secondary | ICD-10-CM

## 2018-07-04 DIAGNOSIS — Z3A32 32 weeks gestation of pregnancy: Secondary | ICD-10-CM | POA: Diagnosis not present

## 2018-07-04 DIAGNOSIS — D509 Iron deficiency anemia, unspecified: Secondary | ICD-10-CM | POA: Diagnosis not present

## 2018-07-04 LAB — IRON AND TIBC
Iron: 27 ug/dL — ABNORMAL LOW (ref 41–142)
Saturation Ratios: 5 % — ABNORMAL LOW (ref 21–57)
TIBC: 595 ug/dL — ABNORMAL HIGH (ref 236–444)
UIBC: 568 ug/dL — AB (ref 120–384)

## 2018-07-04 LAB — CBC WITH DIFFERENTIAL (CANCER CENTER ONLY)
ABS IMMATURE GRANULOCYTES: 0.56 10*3/uL — AB (ref 0.00–0.07)
Basophils Absolute: 0 10*3/uL (ref 0.0–0.1)
Basophils Relative: 0 %
EOS ABS: 0.1 10*3/uL (ref 0.0–0.5)
Eosinophils Relative: 1 %
HCT: 26.6 % — ABNORMAL LOW (ref 36.0–46.0)
Hemoglobin: 8.3 g/dL — ABNORMAL LOW (ref 12.0–15.0)
IMMATURE GRANULOCYTES: 5 %
Lymphocytes Relative: 17 %
Lymphs Abs: 2 10*3/uL (ref 0.7–4.0)
MCH: 25.5 pg — ABNORMAL LOW (ref 26.0–34.0)
MCHC: 31.2 g/dL (ref 30.0–36.0)
MCV: 81.8 fL (ref 80.0–100.0)
MONO ABS: 0.6 10*3/uL (ref 0.1–1.0)
Monocytes Relative: 5 %
Neutro Abs: 8.8 10*3/uL — ABNORMAL HIGH (ref 1.7–7.7)
Neutrophils Relative %: 72 %
Platelet Count: 268 10*3/uL (ref 150–400)
RBC: 3.25 MIL/uL — ABNORMAL LOW (ref 3.87–5.11)
RDW: 14.6 % (ref 11.5–15.5)
WBC: 12.1 10*3/uL — AB (ref 4.0–10.5)
nRBC: 0.2 % (ref 0.0–0.2)

## 2018-07-04 LAB — VITAMIN B12: VITAMIN B 12: 225 pg/mL (ref 180–914)

## 2018-07-04 LAB — FERRITIN: Ferritin: 4 ng/mL — ABNORMAL LOW (ref 11–307)

## 2018-07-04 NOTE — Progress Notes (Signed)
CONSULT NOTE  Patient Care Team: Benay Pike, MD as PCP - General  CHIEF COMPLAINTS/PURPOSE OF CONSULTATION:  Anemia  HISTORY OF PRESENTING ILLNESS:  Kathryn Fox 27 y.o. female is here because of anemia in setting of pregnancy. She is currently [redacted] weeks pregnant. Her most recent hgb was 8.6 and when we repeated it today it was 8.3. She had a hemoglobin of 11.7 at the beginning of her pregnancy.    She was found to have abnormal CBC from 06/03/2018.  She denies recent chest pain on exertion, shortness of breath on minimal exertion, pre-syncopal episodes, or palpitations. She had not noticed any recent bleeding such as epistaxis, hematuria or hematochezia, although her OB notes do reveal occasional hematuria. The patient denies over the counter NSAID ingestion. She is not on antiplatelets agents. Her pregnancy is at 32 weeks. She has never had a colonoscopy.  She had no prior history or diagnosis of cancer. Her age appropriate screening programs are up-to-date. She denies any pica and eats a variety of diet. She never donated blood or received blood transfusion The patient was prescribed oral iron supplements and she tried taking an OTC iron supplement from [facility] but was not able to tolerate it.    MEDICAL HISTORY:  Past Medical History:  Diagnosis Date  . Chronic abdominal pain   . Depression    hx of, no current problems  . Headache   . Infection    UTI  . 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 Colposcopy in April, 2015 clinically normal. Recommend repeat her test Pap in one year.   . SVD (spontaneous vaginal delivery) 01/17/2015  . Vaginal Pap smear, abnormal    bx, f/u ok since    SURGICAL HISTORY: Past Surgical History:  Procedure Laterality Date  . BREAST FIBROADENOMA SURGERY      SOCIAL HISTORY: Social History   Socioeconomic History  . Marital status: Single    Spouse  name: Not on file  . Number of children: Not on file  . Years of education: Not on file  . Highest education level: Not on file  Occupational History  . Not on file  Social Needs  . Financial resource strain: Not on file  . Food insecurity:    Worry: Not on file    Inability: Not on file  . Transportation needs:    Medical: Not on file    Non-medical: Not on file  Tobacco Use  . Smoking status: Former Smoker    Packs/day: 0.00    Types: Cigars    Last attempt to quit: 05/24/2014    Years since quitting: 4.1  . Smokeless tobacco: Current User  . Tobacco comment: also smoked hookah  Substance and Sexual Activity  . Alcohol use: No  . Drug use: No  . Sexual activity: Yes    Birth control/protection: None  Lifestyle  . Physical activity:    Days per week: Not on file    Minutes per session: Not on file  . Stress: Not on file  Relationships  . Social connections:    Talks on phone: Not on file    Gets together: Not on file    Attends religious service: Not on file    Active member of club or organization: Not on file    Attends meetings of clubs or organizations: Not on file    Relationship status: Not on file  . Intimate partner  violence:    Fear of current or ex partner: Not on file    Emotionally abused: Not on file    Physically abused: Not on file    Forced sexual activity: Not on file  Other Topics Concern  . Not on file  Social History Narrative  . Not on file    FAMILY HISTORY: Family History  Problem Relation Age of Onset  . Cancer Maternal Aunt        Breast  . Diabetes Maternal Aunt   . Cancer Maternal Grandfather        lung  . Hypertension Mother   . Hyperlipidemia Mother   . Diabetes Mother   . Congestive Heart Failure Mother   . Healthy Father     ALLERGIES:  is allergic to mushroom extract complex; peanut-containing drug products; penicillins; milk-related compounds; and septra [sulfamethoxazole-trimethoprim].  MEDICATIONS:  Current  Outpatient Medications  Medication Sig Dispense Refill  . pantoprazole (PROTONIX) 40 MG tablet pantoprazole 40 mg tablet,delayed release  TAKE 1 TABLET BY MOUTH TWICE A DAY    . Prenatal Vit-Fe Fumarate-FA (PRENATAL VITAMIN PO) Take by mouth.     No current facility-administered medications for this visit.     REVIEW OF SYSTEMS:   Constitutional: Denies fevers, chills or abnormal night sweats Eyes: Denies blurriness of vision, double vision or watery eyes Ears, nose, mouth, throat, and face: Denies mucositis or sore throat Respiratory: Denies cough, dyspnea or wheezes Cardiovascular: Denies palpitation, chest discomfort or lower extremity swelling Gastrointestinal:  Denies nausea, heartburn or change in bowel habits Skin: Denies abnormal skin rashes Lymphatics: Denies new lymphadenopathy or easy bruising Neurological:Denies numbness, tingling or new weaknesses Behavioral/Psych: Mood is stable, no new changes  All other systems were reviewed with the patient and are negative.  PHYSICAL EXAMINATION: ECOG PERFORMANCE STATUS: 1 - Symptomatic but completely ambulatory  Vitals:   07/04/18 1107  BP: 113/69  Pulse: (!) 101  Resp: 18  Temp: 98.4 F (36.9 C)  SpO2: 100%   Filed Weights   07/04/18 1107  Weight: 182 lb 14.4 oz (83 kg)    GENERAL:alert, no distress and comfortable SKIN: skin color, texture, turgor are normal, no rashes or significant lesions, multiple scattered tattoos noted.  EYES: normal, conjunctiva are pink and non-injected, sclera clearLYMPH:  no palpable lymphadenopathy in the cervical, axillary or inguinal LUNGS: clear to auscultation and percussion with normal breathing effort HEART: regular rate & rhythm and no murmurs and no lower extremity edema ABDOMEN:abdomen soft, - she is pregnant Musculoskeletal:no cyanosis of digits and no clubbing  PSYCH: alert & oriented x 3 with fluent speech NEURO: no focal motor/sensory deficits  LABORATORY DATA:  I have  reviewed the data as listed Recent Results (from the past 2160 hour(s))  Vitamin B12     Status: None   Collection Time: 07/04/18  1:03 PM  Result Value Ref Range   Vitamin B-12 225 180 - 914 pg/mL    Comment: (NOTE) This assay is not validated for testing neonatal or myeloproliferative syndrome specimens for Vitamin B12 levels. Performed at [facility], [place] [address], [place], [place] [address]   CBC with Differential ([facility] Only)     Status: Abnormal   Collection Time: 07/04/18  1:03 PM  Result Value Ref Range   WBC Count 12.1 (H) 4.0 - 10.5 K/uL   RBC 3.25 (L) 3.87 - 5.11 MIL/uL   Hemoglobin 8.3 (L) 12.0 - 15.0 g/dL   HCT 26.6 (L) 36.0 - 46.0 %  MCV 81.8 80.0 - 100.0 fL   MCH 25.5 (L) 26.0 - 34.0 pg   MCHC 31.2 30.0 - 36.0 g/dL   RDW 14.6 11.5 - 15.5 %   Platelet Count 268 150 - 400 K/uL   nRBC 0.2 0.0 - 0.2 %   Neutrophils Relative % 72 %   Neutro Abs 8.8 (H) 1.7 - 7.7 K/uL   Lymphocytes Relative 17 %   Lymphs Abs 2.0 0.7 - 4.0 K/uL   Monocytes Relative 5 %   Monocytes Absolute 0.6 0.1 - 1.0 K/uL   Eosinophils Relative 1 %   Eosinophils Absolute 0.1 0.0 - 0.5 K/uL   Basophils Relative 0 %   Basophils Absolute 0.0 0.0 - 0.1 K/uL   Immature Granulocytes 5 %   Abs Immature Granulocytes 0.56 (H) 0.00 - 0.07 K/uL    Comment: Performed at [facility], [place] [address], [place], [place] [address]  Ferritin     Status: Abnormal   Collection Time: 07/04/18  1:04 PM  Result Value Ref Range   Ferritin 4 (L) 11 - 307 ng/mL    Comment: Performed at [facility], [place] [address], [place], [place] [address]  Iron and TIBC     Status: Abnormal   Collection Time: 07/04/18  1:04 PM  Result Value Ref Range   Iron 27 (L) 41 - 142 ug/dL   TIBC 595 (H) 236 - 444 ug/dL   Saturation Ratios 5 (L) 21 - 57 %   UIBC 568 (H) 120 - 384 ug/dL    Comment: Performed at [facility], [place]  [address], [place], [place] [address]    RADIOGRAPHIC STUDIES: I have personally reviewed the radiological images as listed and agreed with the findings in the report. No results found.  ASSESSMENT & PLAN:  Iron deficiency anemia - reviewed with the patient options for treatment including IV iron infusions. She is going to try NovaFerrum supplement for 2 weeks and will return to have labs checked. If she is unable to tolerate it she will also let us know.   All questions were answered. The patient knows to call the clinic with any problems, questions or concerns. I spent 20 minutes counseling the patient face to face. The total time spent in the appointment was 30 minutes and more than 50% was on counseling.     Bill Salinas, NP-C, AOCNP 07/04/18 11:37 PM

## 2018-07-04 NOTE — Patient Instructions (Signed)
Recommendation: Take NovoFerrum daily and return in 2 weeks for repeat labs and office visit.    To whom it may concern: Kathryn Fox (date of birth [birth year]) was seen today for evaluation of anemia.  If you need additional information please contact us directly.   Burns Spain, NP-C, [place]  Hematology/Oncology [place] [address] [place], [place] [address] Office: [phone] ITUY: [phone]

## 2018-07-12 ENCOUNTER — Encounter (HOSPITAL_COMMUNITY): Payer: Self-pay

## 2018-07-12 ENCOUNTER — Inpatient Hospital Stay (HOSPITAL_COMMUNITY)
Admission: AD | Admit: 2018-07-12 | Discharge: 2018-07-12 | Disposition: A | Discharge status: Home / Self Care | Payer: Medicaid Other | Attending: Obstetrics and Gynecology | Admitting: Obstetrics and Gynecology

## 2018-07-12 ENCOUNTER — Other Ambulatory Visit: Payer: Self-pay

## 2018-07-12 DIAGNOSIS — O2340 Unspecified infection of urinary tract in pregnancy, unspecified trimester: Secondary | ICD-10-CM

## 2018-07-12 DIAGNOSIS — Z79899 Other long term (current) drug therapy: Secondary | ICD-10-CM | POA: Diagnosis not present

## 2018-07-12 DIAGNOSIS — K117 Disturbances of salivary secretion: Secondary | ICD-10-CM

## 2018-07-12 DIAGNOSIS — Z91018 Allergy to other foods: Secondary | ICD-10-CM | POA: Diagnosis not present

## 2018-07-12 DIAGNOSIS — R102 Pelvic and perineal pain: Secondary | ICD-10-CM | POA: Diagnosis not present

## 2018-07-12 DIAGNOSIS — Z803 Family history of malignant neoplasm of breast: Secondary | ICD-10-CM | POA: Insufficient documentation

## 2018-07-12 DIAGNOSIS — Z3A30 30 weeks gestation of pregnancy: Secondary | ICD-10-CM

## 2018-07-12 DIAGNOSIS — Z833 Family history of diabetes mellitus: Secondary | ICD-10-CM | POA: Diagnosis not present

## 2018-07-12 DIAGNOSIS — Z880 Allergy status to penicillin: Secondary | ICD-10-CM | POA: Diagnosis not present

## 2018-07-12 DIAGNOSIS — Z91010 Allergy to peanuts: Secondary | ICD-10-CM | POA: Insufficient documentation

## 2018-07-12 DIAGNOSIS — Z87891 Personal history of nicotine dependence: Secondary | ICD-10-CM | POA: Diagnosis not present

## 2018-07-12 DIAGNOSIS — Z91011 Allergy to milk products: Secondary | ICD-10-CM | POA: Insufficient documentation

## 2018-07-12 DIAGNOSIS — O26893 Other specified pregnancy related conditions, third trimester: Secondary | ICD-10-CM | POA: Insufficient documentation

## 2018-07-12 DIAGNOSIS — Z3A33 33 weeks gestation of pregnancy: Secondary | ICD-10-CM | POA: Diagnosis not present

## 2018-07-12 DIAGNOSIS — Z9889 Other specified postprocedural states: Secondary | ICD-10-CM | POA: Insufficient documentation

## 2018-07-12 DIAGNOSIS — O219 Vomiting of pregnancy, unspecified: Secondary | ICD-10-CM

## 2018-07-12 DIAGNOSIS — K219 Gastro-esophageal reflux disease without esophagitis: Secondary | ICD-10-CM

## 2018-07-12 DIAGNOSIS — Z801 Family history of malignant neoplasm of trachea, bronchus and lung: Secondary | ICD-10-CM | POA: Diagnosis not present

## 2018-07-12 DIAGNOSIS — M79603 Pain in arm, unspecified: Secondary | ICD-10-CM | POA: Diagnosis not present

## 2018-07-12 DIAGNOSIS — Z882 Allergy status to sulfonamides status: Secondary | ICD-10-CM | POA: Diagnosis not present

## 2018-07-12 LAB — URINALYSIS, ROUTINE W REFLEX MICROSCOPIC
Bilirubin Urine: NEGATIVE
Glucose, UA: NEGATIVE mg/dL
KETONES UR: NEGATIVE mg/dL
Nitrite: NEGATIVE
PROTEIN: NEGATIVE mg/dL
Specific Gravity, Urine: 1.01 (ref 1.005–1.030)
pH: 7 (ref 5.0–8.0)

## 2018-07-12 MED ORDER — COMFORT FIT MATERNITY SUPP SM MISC: 1.0000 [IU] | Freq: Every day | 0 refills | Status: DC | PRN

## 2018-07-12 MED ORDER — CYCLOBENZAPRINE HCL 10 MG PO TABS
10.0000 mg | ORAL_TABLET | Freq: Once | ORAL | Status: AC
  Administered 2018-07-12: 10 mg via ORAL
  Filled 2018-07-12: qty 1

## 2018-07-12 MED ORDER — CYCLOBENZAPRINE HCL 10 MG PO TABS: 10.0000 mg | ORAL_TABLET | Freq: Two times a day (BID) | ORAL | 0 refills | Status: DC | PRN

## 2018-07-12 NOTE — MAU Provider Note (Addendum)
History     CSN: [id number]  Arrival date and time: 07/12/18 1044   First Provider Initiated Contact with Patient 07/12/18 1209      Chief Complaint  Patient presents with  . Arm Pain  . Pelvic Pain   HPI  Ms.  Kathryn Fox is a 27 y.o. year old [id number] female at [redacted]w[redacted]d weeks gestation who presents to MAU reporting she woke up with sharp pains and tingling in her RT arm (from shoulder down to fingertips). She reports that this has been happening x 1 week, but this morning was the worse. She states that she can't grip anything with that hand. She called her OB office and was advised to take Tylenol, go to PCP or MAU. She also has a report of "really bad pelvic & hip pain." She reports good (+) FM.  Past Medical History:  Diagnosis Date  . Chronic abdominal pain   . Depression    hx of, no current problems  . Headache   . Infection    UTI  . 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 Colposcopy in April, 2015 clinically normal. Recommend repeat her test Pap in one year.   . SVD (spontaneous vaginal delivery) 01/17/2015  . Vaginal Pap smear, abnormal    bx, f/u ok since    Past Surgical History:  Procedure Laterality Date  . BREAST FIBROADENOMA SURGERY      Family History  Problem Relation Age of Onset  . Cancer Maternal Aunt        Breast  . Diabetes Maternal Aunt   . Cancer Maternal Grandfather        lung  . Hypertension Mother   . Hyperlipidemia Mother   . Diabetes Mother   . Congestive Heart Failure Mother   . Healthy Father     Social History   Tobacco Use  . Smoking status: Former Smoker    Packs/day: 0.00    Types: Cigars    Last attempt to quit: 05/24/2014    Years since quitting: 4.1  . Smokeless tobacco: Never Used  . Tobacco comment: also smoked hookah  Substance Use Topics  . Alcohol use: No  . Drug use: No    Allergies:  Allergies  Allergen Reactions  . Mushroom  Extract Complex Anaphylaxis    Facial swelling  . Peanut-Containing Drug Products Anaphylaxis  . Penicillins Anaphylaxis  . Milk-Related Compounds Nausea And Vomiting  . Septra [Sulfamethoxazole-Trimethoprim] Hives    Medications Prior to Admission  Medication Sig Dispense Refill Last Dose  . pantoprazole (PROTONIX) 40 MG tablet pantoprazole 40 mg tablet,delayed release  TAKE 1 TABLET BY MOUTH TWICE A DAY   Taking  . Prenatal Vit-Fe Fumarate-FA (PRENATAL VITAMIN PO) Take by mouth.   Taking    Review of Systems  Constitutional: Negative.   HENT: Negative.   Eyes: Negative.   Cardiovascular: Negative.   Gastrointestinal: Negative.   Endocrine: Negative.   Genitourinary: Positive for pelvic pain.  Musculoskeletal:       Hip pain  Skin: Negative.   Allergic/Immunologic: Negative.   Neurological: Positive for weakness (in RT arm) and numbness (tingling in RT arm).  Hematological: Negative.   Psychiatric/Behavioral: Negative.    Physical Exam   Blood pressure (!) 125/56, pulse (!) 118, temperature 98.4 F (36.9 C), temperature source Oral, resp. rate 18, weight 84 kg, last menstrual period 10/30/2017, SpO2 99 %.  Physical Exam  Nursing note reviewed.  Constitutional: She is oriented to person, place, and time. She appears well-developed and well-nourished.  HENT:  Head: Normocephalic and atraumatic.  Eyes: Pupils are equal, round, and reactive to light.  Neck: Normal range of motion.  Cardiovascular: Normal rate.  Respiratory: Effort normal.  GI: Soft. She exhibits no distension. There is no abdominal tenderness.  Musculoskeletal: Normal range of motion.  Neurological: She is alert and oriented to person, place, and time.  Skin: Skin is warm and dry.  Psychiatric: She has a normal mood and affect. Her behavior is normal. Judgment and thought content normal.    MAU Course  Procedures  MDM CCUA Flexeril 10 mg -- "some" improvement to arm pain   Results for orders  placed or performed during the hospital encounter of 07/12/18 (from the past 24 hour(s))  Urinalysis, Routine w reflex microscopic     Status: Abnormal   Collection Time: 07/12/18 11:14 AM  Result Value Ref Range   Color, Urine YELLOW YELLOW   APPearance HAZY (A) CLEAR   Specific Gravity, Urine 1.010 1.005 - 1.030   pH 7.0 5.0 - 8.0   Glucose, UA NEGATIVE NEGATIVE mg/dL   Hgb urine dipstick MODERATE (A) NEGATIVE   Bilirubin Urine NEGATIVE NEGATIVE   Ketones, ur NEGATIVE NEGATIVE mg/dL   Protein, ur NEGATIVE NEGATIVE mg/dL   Nitrite NEGATIVE NEGATIVE   Leukocytes, UA MODERATE (A) NEGATIVE   RBC / HPF 0-5 0 - 5 RBC/hpf   WBC, UA 0-5 0 - 5 WBC/hpf   Bacteria, UA RARE (A) NONE SEEN   Squamous Epithelial / LPF 11-20 0 - 5   Mucus PRESENT     Assessment and Plan  Pelvic pain affecting pregnancy in third trimester, antepartum -  - Reassurance given that pelvic pain is normal variance of pregnancy - Information provided on RLP and abd pain in preg - Rx written for maternity support belt   Possible Carpal Tunnel Syndrome - F/U with OB for referral to PT - Discharge patient - Keep scheduled appt with [facility] - Patient verbalized an understanding of the plan of care and agrees.   Laury Deep, MSN, CNM 07/12/2018, 2:25 PM

## 2018-07-12 NOTE — Discharge Instructions (Signed)
Pleas pick up your maternity support belt at: [place],  [phone].

## 2018-07-12 NOTE — MAU Note (Signed)
At like 5 o'clock this morning, pain in arm woke her up.  Sharp pains and tingling.  Can't even grip anything. Called dr, advised to take ES Tylenol, if that didn't help to see primary or come here.  Couldn't get into primary until next wk.  Also having really bad pelvic and hip pain.

## 2018-07-18 ENCOUNTER — Inpatient Hospital Stay (HOSPITAL_BASED_OUTPATIENT_CLINIC_OR_DEPARTMENT_OTHER): Payer: Medicaid Other | Admitting: Hematology

## 2018-07-18 ENCOUNTER — Telehealth: Payer: Self-pay

## 2018-07-18 ENCOUNTER — Inpatient Hospital Stay: Payer: Medicaid Other

## 2018-07-18 DIAGNOSIS — D509 Iron deficiency anemia, unspecified: Secondary | ICD-10-CM

## 2018-07-18 DIAGNOSIS — Z3A32 32 weeks gestation of pregnancy: Secondary | ICD-10-CM | POA: Diagnosis not present

## 2018-07-18 DIAGNOSIS — O99013 Anemia complicating pregnancy, third trimester: Secondary | ICD-10-CM | POA: Insufficient documentation

## 2018-07-18 HISTORY — DX: Iron deficiency anemia, unspecified: D50.9

## 2018-07-18 HISTORY — DX: Anemia complicating pregnancy, third trimester: O99.013

## 2018-07-18 LAB — CBC WITH DIFFERENTIAL (CANCER CENTER ONLY)
ABS IMMATURE GRANULOCYTES: 0.26 10*3/uL — AB (ref 0.00–0.07)
BASOS PCT: 0 %
Basophils Absolute: 0 10*3/uL (ref 0.0–0.1)
Eosinophils Absolute: 0.1 10*3/uL (ref 0.0–0.5)
Eosinophils Relative: 1 %
HEMATOCRIT: 25.3 % — AB (ref 36.0–46.0)
HEMOGLOBIN: 7.7 g/dL — AB (ref 12.0–15.0)
IMMATURE GRANULOCYTES: 2 %
LYMPHS ABS: 1.9 10*3/uL (ref 0.7–4.0)
LYMPHS PCT: 18 %
MCH: 24.5 pg — ABNORMAL LOW (ref 26.0–34.0)
MCHC: 30.4 g/dL (ref 30.0–36.0)
MCV: 80.6 fL (ref 80.0–100.0)
MONOS PCT: 8 %
Monocytes Absolute: 0.8 10*3/uL (ref 0.1–1.0)
NEUTROS ABS: 7.7 10*3/uL (ref 1.7–7.7)
NEUTROS PCT: 71 %
PLATELETS: 292 10*3/uL (ref 150–400)
RBC: 3.14 MIL/uL — ABNORMAL LOW (ref 3.87–5.11)
RDW: 15.4 % (ref 11.5–15.5)
WBC Count: 10.7 10*3/uL — ABNORMAL HIGH (ref 4.0–10.5)
nRBC: 0.4 % — ABNORMAL HIGH (ref 0.0–0.2)

## 2018-07-18 LAB — CMP (CANCER CENTER ONLY)
ALT: 9 U/L (ref 0–44)
AST: 9 U/L — AB (ref 15–41)
Albumin: 2.5 g/dL — ABNORMAL LOW (ref 3.5–5.0)
Alkaline Phosphatase: 128 U/L — ABNORMAL HIGH (ref 38–126)
Anion gap: 5 (ref 5–15)
BILIRUBIN TOTAL: 0.2 mg/dL — AB (ref 0.3–1.2)
BUN: 5 mg/dL — AB (ref 6–20)
CALCIUM: 8.4 mg/dL — AB (ref 8.9–10.3)
CO2: 23 mmol/L (ref 22–32)
CREATININE: 0.65 mg/dL (ref 0.44–1.00)
Chloride: 107 mmol/L (ref 98–111)
GFR, Est AFR Am: >60 mL/min (ref >60)
Glucose, Bld: 100 mg/dL — ABNORMAL HIGH (ref 70–99)
Potassium: 3.6 mmol/L (ref 3.5–5.1)
Sodium: 135 mmol/L (ref 135–145)
TOTAL PROTEIN: 6.2 g/dL — AB (ref 6.5–8.1)

## 2018-07-18 LAB — IRON AND TIBC
IRON: 23 ug/dL — AB (ref 41–142)
Saturation Ratios: 4 % — ABNORMAL LOW (ref 21–57)
TIBC: 536 ug/dL — ABNORMAL HIGH (ref 236–444)
UIBC: 513 ug/dL — ABNORMAL HIGH (ref 120–384)

## 2018-07-18 LAB — FERRITIN: Ferritin: <4 ng/mL — ABNORMAL LOW (ref 11–307)

## 2018-07-18 NOTE — Telephone Encounter (Signed)
Printed avs and calender of upcoming appointment. Per 12/26 los 

## 2018-07-18 NOTE — Progress Notes (Signed)
HEMATOLOGY/ONCOLOGY CLINIC NOTE  Date of Service: 07/18/2018  Patient Care Team: Benay Pike, MD as PCP - General  CHIEF COMPLAINTS/PURPOSE OF CONSULTATION:  F/u of iron deficient anemia   HISTORY OF PRESENTING ILLNESS:  See NP Burns Spain 07/04/18 note   INTERVAL HISTORY   Kathryn Fox is here for a follow up of her iron deficient anemia. She presents to the clinic today by herself.  She notes prior to being anemic she had been on prenatal vitamin. When her OB told her she was anemic she tool oral iron supplement 150mg  BID. She stopped due to nausea. When she saw NP Judson Roch she tried oral iron polysaccharide but stopped after 2 days because she "did not see results".  She sees Dr[facility] Te[facility]o Newcomer at [facility] She notes feeling dizzy from standing for long periods of time.   On review of symptoms, pt notes severe fatigue and dizziness when standing for long period of time. She notes braxton hicks contraction pain. She notes she is due 07/31/17.    MEDICAL HISTORY:  Past Medical History:  Diagnosis Date  . Chronic abdominal pain   . Depression    hx of, no current problems  . Headache   . Infection    UTI  . 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 Colposcopy in April, 2015 clinically normal. Recommend repeat her test Pap in one year.   . SVD (spontaneous vaginal delivery) 01/17/2015  . Vaginal Pap smear, abnormal    bx, f/u ok since    SURGICAL HISTORY: Past Surgical History:  Procedure Laterality Date  . BREAST FIBROADENOMA SURGERY      SOCIAL HISTORY: Social History   Socioeconomic History  . Marital status: Single    Spouse name: Not on file  . Number of children: Not on file  . Years of education: Not on file  . Highest education level: Not on file  Occupational History  . Not on file  Social Needs  . Financial resource strain: Not on file  . Food insecurity:   Worry: Not on file    Inability: Not on file  . Transportation needs:    Medical: Not on file    Non-medical: Not on file  Tobacco Use  . Smoking status: Former Smoker    Packs/day: 0.00    Types: Cigars    Last attempt to quit: 05/24/2014    Years since quitting: 4.1  . Smokeless tobacco: Never Used  . Tobacco comment: also smoked hookah  Substance and Sexual Activity  . Alcohol use: No  . Drug use: No  . Sexual activity: Yes    Birth control/protection: None  Lifestyle  . Physical activity:    Days per week: Not on file    Minutes per session: Not on file  . Stress: Not on file  Relationships  . Social connections:    Talks on phone: Not on file    Gets together: Not on file    Attends religious service: Not on file    Active member of club or organization: Not on file    Attends meetings of clubs or organizations: Not on file    Relationship status: Not on file  . Intimate partner violence:    Fear of current or ex partner: Not on file    Emotionally abused: Not on file    Physically abused: Not on file    Forced sexual activity:  Not on file  Other Topics Concern  . Not on file  Social History Narrative  . Not on file    FAMILY HISTORY: Family History  Problem Relation Age of Onset  . Cancer Maternal Aunt        Breast  . Diabetes Maternal Aunt   . Cancer Maternal Grandfather        lung  . Hypertension Mother   . Hyperlipidemia Mother   . Diabetes Mother   . Congestive Heart Failure Mother   . Healthy Father     ALLERGIES:  is allergic to mushroom extract complex; peanut-containing drug products; penicillins; milk-related compounds; and septra [sulfamethoxazole-trimethoprim].  MEDICATIONS:  Current Outpatient Medications  Medication Sig Dispense Refill  . cyclobenzaprine (FLEXERIL) 10 MG tablet Take 1 tablet (10 mg total) by mouth 2 (two) times daily as needed for muscle spasms. 20 tablet 0  . Elastic Bandages & Supports (COMFORT FIT MATERNITY SUPP  SM) MISC 1 Units by Does not apply route daily as needed. 1 each 0  . pantoprazole (PROTONIX) 40 MG tablet pantoprazole 40 mg tablet,delayed release  TAKE 1 TABLET BY MOUTH TWICE A DAY    . Prenatal Vit-Fe Fumarate-FA (PRENATAL VITAMIN PO) Take by mouth.     No current facility-administered medications for this visit.     REVIEW OF SYSTEMS:    A 10+ POINT REVIEW OF SYSTEMS WAS OBTAINED including neurology, dermatology, psychiatry, cardiac, respiratory, lymph, extremities, GI, GU, Musculoskeletal, constitutional, breasts, reproductive, HEENT.  All pertinent positives are noted in the HPI.  All others are negative.    PHYSICAL EXAMINATION: ECOG PERFORMANCE STATUS: 1 - Symptomatic but completely ambulatory  . Vitals:   07/18/18 1534  BP: 100/67  Pulse: (!) 106  Resp: 18  Temp: 98.2 F (36.8 C)  SpO2: 100%   Filed Weights   07/18/18 1534  Weight: 187 lb 14.4 oz (85.2 kg)   .Body mass index is 31.27 kg/m.  GENERAL:alert, in no acute distress and comfortable SKIN: no acute rashes, no significant lesions EYES: conjunctiva are pink and non-injected, sclera anicteric OROPHARYNX: MMM, no exudates, no oropharyngeal erythema or ulceration NECK: supple, no JVD LYMPH:  no palpable lymphadenopathy in the cervical, axillary or inguinal regions LUNGS: clear to auscultation b/l with normal respiratory effort HEART: regular rate & rhythm ABDOMEN:  normoactive bowel sounds , non tender, not distended. (+) [redacted] weeks pregnant  Extremity: no pedal edema PSYCH: alert & oriented x 3 with fluent speech NEURO: no focal motor/sensory deficits  LABORATORY DATA:  I have reviewed the data as listed  . CBC Latest Ref Rng & Units 07/18/2018 07/04/2018 02/19/2018  WBC 4.0 - 10.5 K/uL 10.7(H) 12.1(H) 9.1  Hemoglobin 12.0 - 15.0 g/dL 7.7(L) 8.3(L) 11.1(L)  Hematocrit 36.0 - 46.0 % 25.3(L) 26.6(L) 32.9(L)  Platelets 150 - 400 K/uL 292 268 273    . CMP Latest Ref Rng & Units 07/18/2018 02/19/2018  05/09/2017  Glucose 70 - 99 mg/dL 100(H) 106(H) 77  BUN 6 - 20 mg/dL 5(L) 8 7  Creatinine 0.44 - 1.00 mg/dL 0.65 0.56 0.69  Sodium 135 - 145 mmol/L 135 134(L) 139  Potassium 3.5 - 5.1 mmol/L 3.6 3.5 3.9  Chloride 98 - 111 mmol/L 107 102 109  CO2 22 - 32 mmol/L 23 21(L) 23  Calcium 8.9 - 10.3 mg/dL 8.4(L) 9.1 9.1  Total Protein 6.5 - 8.1 g/dL 6.2(L) 6.4(L) 7.3  Total Bilirubin 0.3 - 1.2 mg/dL 0.2(L) 0.3 0.1(L)  Alkaline Phos 38 - 126 U/L 128(H)  57 68  AST 15 - 41 U/L 9(L) 13(L) 23  ALT 0 - 44 U/L 9 16 42   . Lab Results  Component Value Date   IRON 23 (L) 07/18/2018   TIBC 536 (H) 07/18/2018   IRONPCTSAT 4 (L) 07/18/2018   (Iron and TIBC)  Lab Results  Component Value Date   FERRITIN <4 (L) 07/18/2018     RADIOGRAPHIC STUDIES: I have personally reviewed the radiological images as listed and agreed with the findings in the report. No results found.  ASSESSMENT & PLAN:  27 y.o. with    1. Severe Iron deficient Anemia, secondary to pregnancy and possible pre-pregnancy Iron deficiency. 2. Symptomatic Anemia. PLAN -Currently at [redacted] weeks pregnant, she is due 07/31/17 -She was started on oral iron polysaccharide but stopped after a few days. -I dicussed pt lab workup from today, 07/18/18; Hg dropped to 7.7, WBC at 10.7 and her ferritin Is <4 -I recommend she continue with iron polysaccharide BID 150mg  po BID.  -I discussed and reiterated that since she is symptomatic with dizziness she should return to her OB office for blood transfusion to target hgb of 9. If that's not possible expeditiously and if she is more symptomatic she should immediately go to [facility] for transfusion. -Patient will benefit from IV Iron but that would not immediately address her symptoms and may not be quick enough to correct her anemia given she is close to term. -I will set her up for IV iron in 1 week.  -I will see her in 2 months       -Patient to call her OB-Gyn doctor Dr Waunita Schooner to  schedule PRBC transfusion x 2 -please schedule IV Venofer weekly x 2 doses at [facility] in 1 week -RTC with Dr Irene Limbo with labs in 2 months   All of the patients questions were answered with apparent satisfaction. The patient knows to call the clinic with any problems, questions or concerns.  I spent 45 minutes counseling the patient face to face. The total time spent in the appointment was 60 minutes and more than 50% was on counseling and direct patient cares.    Sullivan Lone MD MS AAHIVMS [facility] Hematology/Oncology Physician [facility]  (Office):       [phone] (Work cell):  [phone] (Fax):           [phone]  07/18/2018 4:14 PM  I, Joslyn Devon, am acting as scribe for Sullivan Lone, MD.   .I have reviewed the above documentation for accuracy and completeness, and I agree with the above. Brunetta Genera MD

## 2018-07-19 DIAGNOSIS — O339 Maternal care for disproportion, unspecified: Secondary | ICD-10-CM | POA: Diagnosis not present

## 2018-07-23 ENCOUNTER — Telehealth: Payer: Self-pay | Admitting: *Deleted

## 2018-07-23 NOTE — Telephone Encounter (Signed)
"  Kathryn Fox with [facility] GYN, Dr. Melba Coon [phone]).  Kathryn Fox was there last week for iron.  She now has called our office for a referral for blood.  We are trying to figure out what's going on.  Could we have visit note faxed to office since her doctor returns to office Thursday, 07-25-2018?"

## 2018-07-24 NOTE — L&D Delivery Note (Addendum)
Delivery Note Pt pushed for 96min,  1hr to labor down, Then pushed again for an hour for delivery.  At 8:36 PM a viable and healthy female was delivered via VAVD (Presentation: OP; LOT ).  APGAR: 8, 9; weight P .   Placenta status: delivered, intact.  Cord: 3V with the following complications: none.    D/W pt R/b/a of Vacuum assisted delivery, decision to proceed.  1 pull, no pop-off for deliver.  Used Kiwi vacuum  Anesthesia:  epidural Episiotomy: None Lacerations: Labial;Perineal abrasion Suture Repair: 3.0 vicryl rapide Est. Blood Loss (mL): 69cc  Mom to postpartum.  Baby to Couplet care / Skin to Skin.  Kathryn Fox 08/22/2018, 9:00 PM  Br/RI/Tdap in PNC/A+/Contra ? Mirena  Desires circumcision for female infant, inc r/b/a wants to proceed in office.

## 2018-07-25 ENCOUNTER — Encounter (HOSPITAL_COMMUNITY): Payer: Self-pay | Admitting: Obstetrics and Gynecology

## 2018-07-25 ENCOUNTER — Observation Stay (HOSPITAL_COMMUNITY)
Admission: AD | Admit: 2018-07-25 | Discharge: 2018-07-25 | Disposition: A | Discharge status: Home / Self Care | Payer: Medicaid Other | Attending: Obstetrics and Gynecology | Admitting: Obstetrics and Gynecology

## 2018-07-25 DIAGNOSIS — O99343 Other mental disorders complicating pregnancy, third trimester: Secondary | ICD-10-CM | POA: Diagnosis not present

## 2018-07-25 DIAGNOSIS — Z87891 Personal history of nicotine dependence: Secondary | ICD-10-CM | POA: Diagnosis not present

## 2018-07-25 DIAGNOSIS — D649 Anemia, unspecified: Secondary | ICD-10-CM | POA: Diagnosis present

## 2018-07-25 DIAGNOSIS — O99019 Anemia complicating pregnancy, unspecified trimester: Secondary | ICD-10-CM

## 2018-07-25 DIAGNOSIS — O99013 Anemia complicating pregnancy, third trimester: Secondary | ICD-10-CM | POA: Diagnosis present

## 2018-07-25 DIAGNOSIS — F329 Major depressive disorder, single episode, unspecified: Secondary | ICD-10-CM | POA: Insufficient documentation

## 2018-07-25 DIAGNOSIS — Z3A35 35 weeks gestation of pregnancy: Secondary | ICD-10-CM | POA: Insufficient documentation

## 2018-07-25 HISTORY — DX: Anemia complicating pregnancy, unspecified trimester: O99.019

## 2018-07-25 LAB — PREPARE RBC (CROSSMATCH)

## 2018-07-25 LAB — HEMOGLOBIN AND HEMATOCRIT, BLOOD
HCT: 26.5 % — ABNORMAL LOW (ref 36.0–46.0)
Hemoglobin: 8 g/dL — ABNORMAL LOW (ref 12.0–15.0)

## 2018-07-25 MED ORDER — SODIUM CHLORIDE 0.9% IV SOLUTION
Freq: Once | INTRAVENOUS | Status: AC
  Administered 2018-07-25: 15:00:00 via INTRAVENOUS

## 2018-07-25 MED ORDER — ACETAMINOPHEN 325 MG PO TABS: 650.0000 mg | ORAL_TABLET | ORAL | Status: DC | PRN

## 2018-07-25 MED ORDER — SODIUM CHLORIDE 0.9 % IV SOLN
510.0000 mg | Freq: Once | INTRAVENOUS | Status: AC
  Administered 2018-07-25: 510 mg via INTRAVENOUS
  Filled 2018-07-25: qty 17

## 2018-07-25 MED ORDER — ACETAMINOPHEN 325 MG PO TABS
650.0000 mg | ORAL_TABLET | Freq: Once | ORAL | Status: AC
  Administered 2018-07-25: 650 mg via ORAL
  Filled 2018-07-25: qty 2

## 2018-07-25 MED ORDER — DIPHENHYDRAMINE HCL 25 MG PO CAPS
25.0000 mg | ORAL_CAPSULE | Freq: Once | ORAL | Status: AC
  Administered 2018-07-25: 25 mg via ORAL
  Filled 2018-07-25: qty 1

## 2018-07-25 MED ORDER — ZOLPIDEM TARTRATE 5 MG PO TABS: 5.0000 mg | ORAL_TABLET | Freq: Every evening | ORAL | Status: DC | PRN

## 2018-07-25 MED ORDER — CALCIUM CARBONATE ANTACID 500 MG PO CHEW: 2.0000 | CHEWABLE_TABLET | ORAL | Status: DC | PRN

## 2018-07-25 MED ORDER — PRENATAL MULTIVITAMIN CH: 1.0000 | ORAL_TABLET | Freq: Every day | ORAL | Status: DC

## 2018-07-25 MED ORDER — DOCUSATE SODIUM 100 MG PO CAPS: 100.0000 mg | ORAL_CAPSULE | Freq: Every day | ORAL | Status: DC

## 2018-07-25 NOTE — Discharge Summary (Signed)
  Pt with symptomatic anemia - admitted for iron infusion and transfusion 2u pRBCS Tolerated well.    D/C home with f/u appt 07/31/18

## 2018-07-25 NOTE — H&P (Signed)
Kathryn Fox is a 28 y.o. female G2P1001 at 58+ with symptomatic anemia - for 2 u pRBCs and iron infusion per digestive health clinc.  No pregnancy sx's.    OB History    Gravida  2   Para  1   Term  1   Preterm      AB      Living  1     SAB      TAB      Ectopic      Multiple  0   Live Births  1          Past Medical History:  Diagnosis Date  . Anemia affecting second pregnancy 07/25/2018  . Chronic abdominal pain   . Depression    hx of, no current problems  . Headache   . Infection    UTI  . 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 Colposcopy in April, 2015 clinically normal. Recommend repeat her test Pap in one year.   . SVD (spontaneous vaginal delivery) 01/17/2015  . Vaginal Pap smear, abnormal    bx, f/u ok since   Past Surgical History:  Procedure Laterality Date  . BREAST FIBROADENOMA SURGERY     Family History: family history includes Cancer in her maternal aunt and maternal grandfather; Congestive Heart Failure in her mother; Diabetes in her maternal aunt and mother; Healthy in her father; Hyperlipidemia in her mother; Hypertension in her mother. Social History:  reports that she quit smoking about 4 years ago. Her smoking use included cigars. She smoked 0.00 packs per day. She has never used smokeless tobacco. She reports that she does not drink alcohol or use drugs.     Maternal Diabetes: No Genetic Screening: Normal Maternal Ultrasounds/Referrals: Normal Fetal Ultrasounds or other Referrals:  None Maternal Substance Abuse:  No Significant Maternal Medications:  None Significant Maternal Lab Results:  None Other Comments:  severe anemia  Review of Systems  Constitutional: Positive for malaise/fatigue.       Congestion, URI sx's  HENT: Negative.   Eyes: Negative.   Respiratory: Negative.   Cardiovascular: Negative.   Gastrointestinal: Negative.    Genitourinary: Negative.   Musculoskeletal: Negative.   Skin: Negative.   Neurological: Negative.   Psychiatric/Behavioral: Negative.    Maternal Medical History:  Fetal activity: Perceived fetal activity is normal.    Prenatal complications: no prenatal complications Prenatal Complications - Diabetes: none.      Blood pressure 119/75, pulse 98, temperature 98.5 F (36.9 C), temperature source Oral, resp. rate 18, last menstrual period 10/30/2017. Maternal Exam:  Uterine Assessment: Contraction frequency is irregular.   Abdomen: Fundal height is appropriate for gestation.    Introitus: Normal vulva. Normal vagina.    Physical Exam  Constitutional: She is oriented to person, place, and time. She appears well-developed and well-nourished.  congestion  HENT:  Head: Normocephalic and atraumatic.  Cardiovascular: Normal rate and regular rhythm.  Respiratory: Effort normal and breath sounds normal. No respiratory distress. She has no wheezes.  GI: Soft. Bowel sounds are normal. She exhibits no distension. There is no abdominal tenderness.  Genitourinary:    Vulva normal.   Musculoskeletal: Normal range of motion.  Neurological: She is alert and oriented to person, place, and time.  Skin: Skin is warm and dry.  Psychiatric: She has a normal mood and affect. Her behavior is normal.    Prenatal labs: ABO, Rh: --/--/A POS (01/02 1430)  Antibody: NEG (01/02 1430) Rubella:   RPR:    HBsAg:    HIV:    GBS:     Assessment/Plan: 27yo G2P1001 at 35+ with severe anemia IV iron and transfusion 2 u pRBCS   Kathryn Fox 07/25/2018, 7:11 PM

## 2018-07-25 NOTE — Progress Notes (Signed)
Patient ID: Kathryn Fox, female   DOB: 11/21/1990, 28 y.o.   MRN: [id number]   Pt has received 2 units pRBCs and iron infusion Feels like has more energy.  +FM, no LOF, no VB, rare ctx  Will get NST before d/c  Reviewed w pt, has f/u appt 07/31/18

## 2018-07-26 LAB — TYPE AND SCREEN
ABO/RH(D): A POS
ANTIBODY SCREEN: NEGATIVE
Unit division: 0
Unit division: 0

## 2018-07-26 LAB — BPAM RBC
Blood Product Expiration Date: 202001172359
Blood Product Expiration Date: 202001172359
ISSUE DATE / TIME: 202001021536
ISSUE DATE / TIME: 202001021716
UNIT TYPE AND RH: 6200
Unit Type and Rh: 6200

## 2018-07-29 ENCOUNTER — Other Ambulatory Visit: Payer: Self-pay | Admitting: Hematology

## 2018-07-31 ENCOUNTER — Telehealth: Payer: Self-pay | Admitting: *Deleted

## 2018-07-31 DIAGNOSIS — Z720 Tobacco use: Secondary | ICD-10-CM | POA: Diagnosis not present

## 2018-07-31 DIAGNOSIS — Z3685 Encounter for antenatal screening for Streptococcus B: Secondary | ICD-10-CM | POA: Diagnosis not present

## 2018-07-31 DIAGNOSIS — Z3A29 29 weeks gestation of pregnancy: Secondary | ICD-10-CM | POA: Diagnosis not present

## 2018-07-31 DIAGNOSIS — O99019 Anemia complicating pregnancy, unspecified trimester: Secondary | ICD-10-CM | POA: Diagnosis not present

## 2018-07-31 DIAGNOSIS — K219 Gastro-esophageal reflux disease without esophagitis: Secondary | ICD-10-CM | POA: Diagnosis not present

## 2018-07-31 DIAGNOSIS — G43909 Migraine, unspecified, not intractable, without status migrainosus: Secondary | ICD-10-CM | POA: Diagnosis not present

## 2018-07-31 DIAGNOSIS — A609 Anogenital herpesviral infection, unspecified: Secondary | ICD-10-CM | POA: Diagnosis not present

## 2018-07-31 NOTE — Telephone Encounter (Signed)
Err

## 2018-08-01 LAB — OB RESULTS CONSOLE GBS: GBS: NEGATIVE

## 2018-08-13 DIAGNOSIS — N898 Other specified noninflammatory disorders of vagina: Secondary | ICD-10-CM | POA: Diagnosis not present

## 2018-08-13 DIAGNOSIS — Z3483 Encounter for supervision of other normal pregnancy, third trimester: Secondary | ICD-10-CM | POA: Diagnosis not present

## 2018-08-13 DIAGNOSIS — Z3A37 37 weeks gestation of pregnancy: Secondary | ICD-10-CM | POA: Diagnosis not present

## 2018-08-16 ENCOUNTER — Encounter (HOSPITAL_COMMUNITY): Payer: Self-pay | Admitting: *Deleted

## 2018-08-16 ENCOUNTER — Telehealth (HOSPITAL_COMMUNITY): Payer: Self-pay | Admitting: *Deleted

## 2018-08-16 NOTE — Telephone Encounter (Signed)
Preadmission screen  

## 2018-08-21 NOTE — H&P (Signed)
Kathryn Fox is a 28 y.o. female G2P1001 at 24+ with EDC by 08/29/18 - dated by early [place].  With iron deficiency anemia - treated with iron infusion and blood tranfusion.  Pregnancy also complicated by excessive spitting, HSV, tobacco use and depression/anxiety and migraine.  Received flu and Tdap vaccine in pregnancy.    OB History    Gravida  2   Para  1   Term  1   Preterm      AB      Living  1     SAB      TAB      Ectopic      Multiple  0   Live Births  1         G1 - 37wk SVD 5#8 G2 - present  + abn pap, last WNL H/o HSV 2  Past Medical History:  Diagnosis Date  . Anemia affecting second pregnancy 07/25/2018  . Chronic abdominal pain   . Depression    hx of, no current problems on zoloft  . Excessive salivation while pregnant   . GERD (gastroesophageal reflux disease)   . Headache   . HSV (herpes simplex virus) anogenital infection   . Infection    UTI  . 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 Colposcopy in April, 2015 clinically normal. Recommend repeat her test Pap in one year.   . SVD (spontaneous vaginal delivery) 01/17/2015  . Vaginal Pap smear, abnormal    bx, f/u ok since   Past Surgical History:  Procedure Laterality Date  . BREAST FIBROADENOMA SURGERY     Family History: family history includes Cancer in her maternal aunt and maternal grandfather; Congestive Heart Failure in her mother; Diabetes in her maternal aunt and mother; Healthy in her father; Hyperlipidemia in her mother; Hypertension in her mother. Social History:  reports that she quit smoking about 4 years ago. Her smoking use included cigars. She smoked 0.00 packs per day. She has never used smokeless tobacco. She reports that she does not drink alcohol or use drugs. single - in relationship  Meds Protonix, setraline, PNV, Valtrex  All milk, mushroom, peanut, Septra     Maternal Diabetes: No Genetic  Screening: Normal Maternal Ultrasounds/Referrals: Normal Fetal Ultrasounds or other Referrals:  None Maternal Substance Abuse:  No Significant Maternal Medications:  Meds include: Zoloft Protonix Significant Maternal Lab Results:  Lab values include: Group B Strep negative Other Comments:  None  Review of Systems  Constitutional: Negative.   HENT: Negative.   Eyes: Negative.   Respiratory: Negative.   Cardiovascular: Negative.   Gastrointestinal: Negative.   Genitourinary: Negative.   Musculoskeletal: Negative.   Skin: Negative.   Neurological: Negative.   Psychiatric/Behavioral: Negative.    Maternal Medical History:  Contractions: Frequency: irregular.    Fetal activity: Perceived fetal activity is normal.    Prenatal complications: Iron deficiency anemia  Prenatal Complications - Diabetes: none.      Last menstrual period 10/30/2017. Maternal Exam:  Uterine Assessment: Contraction strength is moderate.  Contraction frequency is irregular.   Abdomen: Patient reports no abdominal tenderness. Fundal height is appropriate for gestation.   Estimated fetal weight is 6-7#.   Fetal presentation: vertex  Introitus: Normal vulva. Normal vagina.    Physical Exam  Constitutional: She is oriented to person, place, and time. She appears well-developed and well-nourished.  HENT:  Head: Normocephalic and atraumatic.  Cardiovascular: Normal rate and  regular rhythm.  Respiratory: Effort normal. No respiratory distress. She has no wheezes.  GI: Soft. Bowel sounds are normal. She exhibits no distension.  Genitourinary:    Vulva normal.   Musculoskeletal: Normal range of motion.  Neurological: She is alert and oriented to person, place, and time.  Skin: Skin is warm and dry.  Psychiatric: She has a normal mood and affect. Her behavior is normal.    Prenatal labs: ABO, Rh: --/--/A POS (01/02 1430) Antibody: NEG (01/02 1430) Rubella: Immune (07/25 0000) RPR: Nonreactive  (07/25 0000)  HBsAg: Negative (07/25 0000)  HIV: Non-reactive (07/25 0000)  GBS: Negative (01/09 0000)   Hgb 11.3/Plt 300/Ur Cx neg/ GC neg/Chl neg/nl hgb electro/ glucola 64/varicella immune.  CF, SMA, negative, fragile X neg  Nl anat, post plac  Assessment/Plan: 28yo G2P1001 at 39+ for IOL given term and favorable Admit to L&D Epidural for pain prn (or Stadol or nitrous) IOL with AROM and pitocin Expect SVD  Kathryn Fox 08/21/2018, 8:10 AM

## 2018-08-22 ENCOUNTER — Inpatient Hospital Stay (HOSPITAL_COMMUNITY): Payer: Medicaid Other | Admitting: Anesthesiology

## 2018-08-22 ENCOUNTER — Inpatient Hospital Stay (HOSPITAL_COMMUNITY)
Admission: RE | Admit: 2018-08-22 | Discharge: 2018-08-24 | DRG: 807 | Disposition: A | Discharge status: Home / Self Care | Payer: Medicaid Other | Attending: Obstetrics and Gynecology | Admitting: Obstetrics and Gynecology

## 2018-08-22 ENCOUNTER — Encounter (HOSPITAL_COMMUNITY): Payer: Self-pay

## 2018-08-22 ENCOUNTER — Other Ambulatory Visit: Payer: Self-pay

## 2018-08-22 DIAGNOSIS — O99019 Anemia complicating pregnancy, unspecified trimester: Secondary | ICD-10-CM

## 2018-08-22 DIAGNOSIS — O9962 Diseases of the digestive system complicating childbirth: Secondary | ICD-10-CM | POA: Diagnosis present

## 2018-08-22 DIAGNOSIS — Z3483 Encounter for supervision of other normal pregnancy, third trimester: Secondary | ICD-10-CM | POA: Diagnosis present

## 2018-08-22 DIAGNOSIS — O9902 Anemia complicating childbirth: Principal | ICD-10-CM | POA: Diagnosis present

## 2018-08-22 DIAGNOSIS — D509 Iron deficiency anemia, unspecified: Secondary | ICD-10-CM | POA: Diagnosis present

## 2018-08-22 DIAGNOSIS — F329 Major depressive disorder, single episode, unspecified: Secondary | ICD-10-CM | POA: Diagnosis present

## 2018-08-22 DIAGNOSIS — K219 Gastro-esophageal reflux disease without esophagitis: Secondary | ICD-10-CM

## 2018-08-22 DIAGNOSIS — O99344 Other mental disorders complicating childbirth: Secondary | ICD-10-CM | POA: Diagnosis present

## 2018-08-22 DIAGNOSIS — Z3A39 39 weeks gestation of pregnancy: Secondary | ICD-10-CM | POA: Diagnosis not present

## 2018-08-22 DIAGNOSIS — F419 Anxiety disorder, unspecified: Secondary | ICD-10-CM | POA: Diagnosis present

## 2018-08-22 DIAGNOSIS — O2340 Unspecified infection of urinary tract in pregnancy, unspecified trimester: Secondary | ICD-10-CM

## 2018-08-22 DIAGNOSIS — Z87891 Personal history of nicotine dependence: Secondary | ICD-10-CM | POA: Diagnosis not present

## 2018-08-22 DIAGNOSIS — K117 Disturbances of salivary secretion: Secondary | ICD-10-CM

## 2018-08-22 DIAGNOSIS — O219 Vomiting of pregnancy, unspecified: Secondary | ICD-10-CM

## 2018-08-22 LAB — CBC
HEMATOCRIT: 35.8 % — AB (ref 36.0–46.0)
Hemoglobin: 11.4 g/dL — ABNORMAL LOW (ref 12.0–15.0)
MCH: 26 pg (ref 26.0–34.0)
MCHC: 31.8 g/dL (ref 30.0–36.0)
MCV: 81.5 fL (ref 80.0–100.0)
Platelets: 194 10*3/uL (ref 150–400)
RBC: 4.39 MIL/uL (ref 3.87–5.11)
RDW: 20.2 % — AB (ref 11.5–15.5)
WBC: 9.8 10*3/uL (ref 4.0–10.5)
nRBC: 0 % (ref 0.0–0.2)

## 2018-08-22 LAB — TYPE AND SCREEN
ABO/RH(D): A POS
Antibody Screen: NEGATIVE

## 2018-08-22 LAB — RPR: RPR Ser Ql: NONREACTIVE

## 2018-08-22 MED ORDER — OXYTOCIN BOLUS FROM INFUSION
500.0000 mL | Freq: Once | INTRAVENOUS | Status: AC
  Administered 2018-08-22: 500 mL via INTRAVENOUS

## 2018-08-22 MED ORDER — OXYTOCIN 40 UNITS IN NORMAL SALINE INFUSION - SIMPLE MED
1.0000 m[IU]/min | INTRAVENOUS | Status: DC
  Administered 2018-08-22: 2 m[IU]/min via INTRAVENOUS
  Administered 2018-08-22: 10 m[IU]/min via INTRAVENOUS
  Filled 2018-08-22: qty 1000

## 2018-08-22 MED ORDER — PHENYLEPHRINE 40 MCG/ML (10ML) SYRINGE FOR IV PUSH (FOR BLOOD PRESSURE SUPPORT)
80.0000 ug | PREFILLED_SYRINGE | INTRAVENOUS | Status: DC | PRN
  Filled 2018-08-22: qty 10

## 2018-08-22 MED ORDER — PRENATAL MULTIVITAMIN CH
1.0000 | ORAL_TABLET | Freq: Every day | ORAL | Status: DC
  Administered 2018-08-23 – 2018-08-24 (×2): 1 via ORAL
  Filled 2018-08-22 (×2): qty 1

## 2018-08-22 MED ORDER — LACTATED RINGERS IV SOLN
500.0000 mL | Freq: Once | INTRAVENOUS | Status: AC
  Administered 2018-08-22: 500 mL via INTRAVENOUS

## 2018-08-22 MED ORDER — TERBUTALINE SULFATE 1 MG/ML IJ SOLN
0.2500 mg | Freq: Once | INTRAMUSCULAR | Status: DC | PRN
  Filled 2018-08-22: qty 1

## 2018-08-22 MED ORDER — SIMETHICONE 80 MG PO CHEW: 80.0000 mg | CHEWABLE_TABLET | ORAL | Status: DC | PRN

## 2018-08-22 MED ORDER — PANTOPRAZOLE SODIUM 40 MG PO TBEC
40.0000 mg | DELAYED_RELEASE_TABLET | Freq: Every day | ORAL | Status: DC
  Administered 2018-08-23 – 2018-08-24 (×2): 40 mg via ORAL
  Filled 2018-08-22 (×2): qty 1

## 2018-08-22 MED ORDER — IBUPROFEN 600 MG PO TABS
600.0000 mg | ORAL_TABLET | Freq: Four times a day (QID) | ORAL | Status: DC
  Administered 2018-08-22 – 2018-08-24 (×7): 600 mg via ORAL
  Filled 2018-08-22 (×7): qty 1

## 2018-08-22 MED ORDER — OXYCODONE-ACETAMINOPHEN 5-325 MG PO TABS: 2.0000 | ORAL_TABLET | ORAL | Status: DC | PRN

## 2018-08-22 MED ORDER — ZOLPIDEM TARTRATE 5 MG PO TABS: 5.0000 mg | ORAL_TABLET | Freq: Every evening | ORAL | Status: DC | PRN

## 2018-08-22 MED ORDER — OXYCODONE HCL 5 MG PO TABS
5.0000 mg | ORAL_TABLET | ORAL | Status: DC | PRN
  Administered 2018-08-23 – 2018-08-24 (×4): 5 mg via ORAL
  Filled 2018-08-22 (×4): qty 1

## 2018-08-22 MED ORDER — ONDANSETRON HCL 4 MG/2ML IJ SOLN
4.0000 mg | Freq: Four times a day (QID) | INTRAMUSCULAR | Status: DC | PRN
  Administered 2018-08-22: 4 mg via INTRAVENOUS
  Filled 2018-08-22: qty 2

## 2018-08-22 MED ORDER — SENNOSIDES-DOCUSATE SODIUM 8.6-50 MG PO TABS
2.0000 | ORAL_TABLET | ORAL | Status: DC
  Administered 2018-08-22 – 2018-08-23 (×2): 2 via ORAL
  Filled 2018-08-22 (×2): qty 2

## 2018-08-22 MED ORDER — LIDOCAINE HCL (PF) 1 % IJ SOLN
INTRAMUSCULAR | Status: DC | PRN
  Administered 2018-08-22: 3 mL via EPIDURAL
  Administered 2018-08-22: 5 mL via EPIDURAL
  Administered 2018-08-22: 2 mL via EPIDURAL

## 2018-08-22 MED ORDER — EPHEDRINE 5 MG/ML INJ
10.0000 mg | INTRAVENOUS | Status: DC | PRN
  Filled 2018-08-22: qty 2

## 2018-08-22 MED ORDER — OXYTOCIN 40 UNITS IN NORMAL SALINE INFUSION - SIMPLE MED
2.5000 [IU]/h | INTRAVENOUS | Status: DC
  Administered 2018-08-22: 2.5 [IU]/h via INTRAVENOUS

## 2018-08-22 MED ORDER — FENTANYL 2.5 MCG/ML BUPIVACAINE 1/10 % EPIDURAL INFUSION (WH - ANES)
14.0000 mL/h | INTRAMUSCULAR | Status: DC | PRN
  Administered 2018-08-22 (×2): 14 mL/h via EPIDURAL
  Filled 2018-08-22 (×2): qty 100

## 2018-08-22 MED ORDER — OXYCODONE HCL 5 MG PO TABS: 10.0000 mg | ORAL_TABLET | ORAL | Status: DC | PRN

## 2018-08-22 MED ORDER — OXYCODONE-ACETAMINOPHEN 5-325 MG PO TABS: 1.0000 | ORAL_TABLET | ORAL | Status: DC | PRN

## 2018-08-22 MED ORDER — BUTORPHANOL TARTRATE 1 MG/ML IJ SOLN
1.0000 mg | INTRAMUSCULAR | Status: DC | PRN
  Administered 2018-08-22: 1 mg via INTRAVENOUS
  Filled 2018-08-22: qty 1

## 2018-08-22 MED ORDER — WITCH HAZEL-GLYCERIN EX PADS: 1.0000 "application " | MEDICATED_PAD | CUTANEOUS | Status: DC | PRN

## 2018-08-22 MED ORDER — ONDANSETRON HCL 4 MG PO TABS: 4.0000 mg | ORAL_TABLET | ORAL | Status: DC | PRN

## 2018-08-22 MED ORDER — COCONUT OIL OIL: 1.0000 "application " | TOPICAL_OIL | Status: DC | PRN

## 2018-08-22 MED ORDER — LACTATED RINGERS IV SOLN: 500.0000 mL | Freq: Once | INTRAVENOUS | Status: DC

## 2018-08-22 MED ORDER — LACTATED RINGERS IV SOLN
INTRAVENOUS | Status: DC
  Administered 2018-08-22 (×2): via INTRAVENOUS

## 2018-08-22 MED ORDER — ACETAMINOPHEN 325 MG PO TABS: 650.0000 mg | ORAL_TABLET | ORAL | Status: DC | PRN

## 2018-08-22 MED ORDER — DIPHENHYDRAMINE HCL 25 MG PO CAPS: 25.0000 mg | ORAL_CAPSULE | Freq: Four times a day (QID) | ORAL | Status: DC | PRN

## 2018-08-22 MED ORDER — ACETAMINOPHEN 325 MG PO TABS
650.0000 mg | ORAL_TABLET | ORAL | Status: DC | PRN
  Administered 2018-08-23 – 2018-08-24 (×3): 650 mg via ORAL
  Filled 2018-08-22 (×3): qty 2

## 2018-08-22 MED ORDER — SOD CITRATE-CITRIC ACID 500-334 MG/5ML PO SOLN: 30.0000 mL | ORAL | Status: DC | PRN

## 2018-08-22 MED ORDER — LACTATED RINGERS IV SOLN
500.0000 mL | INTRAVENOUS | Status: DC | PRN
  Administered 2018-08-22: 300 mL via INTRAVENOUS

## 2018-08-22 MED ORDER — DIPHENHYDRAMINE HCL 50 MG/ML IJ SOLN: 12.5000 mg | INTRAMUSCULAR | Status: DC | PRN

## 2018-08-22 MED ORDER — PHENYLEPHRINE 40 MCG/ML (10ML) SYRINGE FOR IV PUSH (FOR BLOOD PRESSURE SUPPORT)
80.0000 ug | PREFILLED_SYRINGE | INTRAVENOUS | Status: DC | PRN
  Filled 2018-08-22 (×2): qty 10

## 2018-08-22 MED ORDER — DIBUCAINE 1 % RE OINT
1.0000 "application " | TOPICAL_OINTMENT | RECTAL | Status: DC | PRN
  Filled 2018-08-22: qty 28

## 2018-08-22 MED ORDER — LIDOCAINE HCL (PF) 1 % IJ SOLN
30.0000 mL | INTRAMUSCULAR | Status: DC | PRN
  Filled 2018-08-22 (×2): qty 30

## 2018-08-22 MED ORDER — BENZOCAINE-MENTHOL 20-0.5 % EX AERO
1.0000 "application " | INHALATION_SPRAY | CUTANEOUS | Status: DC | PRN
  Filled 2018-08-22: qty 56

## 2018-08-22 MED ORDER — ONDANSETRON HCL 4 MG/2ML IJ SOLN: 4.0000 mg | INTRAMUSCULAR | Status: DC | PRN

## 2018-08-22 NOTE — Progress Notes (Signed)
Patient ID: Kathryn Fox, female   DOB: 02/20/1991, 28 y.o.   MRN: [id number]   Pt comfortable w epidural, but some pressure  AFVSS gen NAD FHTs 130's, mod var, + accel, category 1 toco q 1-29min  SVE 6.7/80/0-+1  Plan for SVD soon

## 2018-08-22 NOTE — Progress Notes (Signed)
Patient ID: Kathryn Fox, female   DOB: Jun 27, 1991, 28 y.o.   MRN: [id number]   SVE 10/100/+2  FHTs 135's, mod var, + accels, some earlies - category 1 toco q 2-7min.   Will start pushing, expect SVD

## 2018-08-22 NOTE — Anesthesia Procedure Notes (Signed)
Epidural Patient location during procedure: OB Start time: 08/22/2018 12:34 PM End time: 08/22/2018 12:44 PM  Staffing Anesthesiologist: Catalina Gravel, MD Performed: anesthesiologist   Preanesthetic Checklist Completed: patient identified, pre-op evaluation, timeout performed, IV checked, risks and benefits discussed and monitors and equipment checked  Epidural Patient position: sitting Prep: DuraPrep Patient monitoring: blood pressure and continuous pulse ox Approach: midline Location: L3-L4 Injection technique: LOR air  Needle:  Needle type: Tuohy  Needle gauge: 17 G Needle length: 9 cm Needle insertion depth: 5 cm Catheter size: 19 Gauge Catheter at skin depth: 10 cm Test dose: negative and Other (1% Lidocaine)  Additional Notes Patient identified.  Risk benefits discussed including failed block, incomplete pain control, headache, nerve damage, paralysis, blood pressure changes, nausea, vomiting, reactions to medication both toxic or allergic, and postpartum back pain.  Patient expressed understanding and wished to proceed.  All questions were answered.  Sterile technique used throughout procedure and epidural site dressed with sterile barrier dressing. No paresthesia or other complications noted. The patient did not experience any signs of intravascular injection such as tinnitus or metallic taste in mouth nor signs of intrathecal spread such as rapid motor block. Please see nursing notes for vital signs. Reason for block:procedure for pain

## 2018-08-22 NOTE — Anesthesia Pain Management Evaluation Note (Signed)
  CRNA Pain Management Visit Note  Patient: Kathryn Fox, 28 y.o., female  "Hello I am a member of the anesthesia team at [facility]. We have an anesthesia team available at all times to provide care throughout the hospital, including epidural management and anesthesia for C-section. I don't know your plan for the delivery whether it a natural birth, water birth, IV sedation, nitrous supplementation, doula or epidural, but we want to meet your pain goals."   1.Was your pain managed to your expectations on prior hospitalizations?   Yes   2.What is your expectation for pain management during this hospitalization?     Epidural, IV pain meds and Water tub  3.How can we help you reach that goal? *support**  Record the patient's initial score and the patient's pain goal.   Pain: 7  Pain Goal: 9 [facility] wants you to be able to say your pain was always managed very well.  Sandrea Matte 08/22/2018

## 2018-08-22 NOTE — Progress Notes (Signed)
Patient ID: Kathryn Fox, female   DOB: 03-20-1991, 28 y.o.   MRN: [id number]   Pressure with ctx,  AFVSS gen NAD FHTs 150's mod var, some early and late decels during pushing, + accels, category 2 toco q 2-68min  10/100/+2-3 Not  Much descent, d/w pt possible need for assistance - d/w pt vaccuum use, d/w pt r/b/a if 3 pop offs will need to have section.  Also d/w pt swelling and caput.  Pt doesn't want cesarean section.  Will push until 8:30 and reassess

## 2018-08-22 NOTE — Progress Notes (Signed)
Patient ID: Kathryn Fox, female   DOB: 1990-11-16, 28 y.o.   MRN: [id number]   H&P reviewed, no changes  AFVSS  gen NAD FHTs 150's mod var, category 1 toco occ  AROM for clear fluid  SVE 2.3/30/-2  Start IOL- will start pitocin Expect SVD

## 2018-08-22 NOTE — Anesthesia Preprocedure Evaluation (Signed)
Anesthesia Evaluation  Patient identified by MRN, date of birth, ID band Patient awake    Reviewed: Allergy & Precautions, NPO status , Patient's Chart, lab work & pertinent test results  Airway Mallampati: II  TM Distance: >3 FB Neck ROM: Full    Dental  (+) Teeth Intact, Dental Advisory Given   Pulmonary former smoker,    Pulmonary exam normal breath sounds clear to auscultation       Cardiovascular negative cardio ROS Normal cardiovascular exam Rhythm:Regular Rate:Normal     Neuro/Psych  Headaches, PSYCHIATRIC DISORDERS Depression    GI/Hepatic Neg liver ROS, GERD  ,  Endo/Other  negative endocrine ROSObesity   Renal/GU negative Renal ROS     Musculoskeletal negative musculoskeletal ROS (+)   Abdominal   Peds  Hematology  (+) Blood dyscrasia, anemia , Plt 194k   Anesthesia Other Findings Day of surgery medications reviewed with the patient.  Reproductive/Obstetrics (+) Pregnancy                             Anesthesia Physical Anesthesia Plan  ASA: II  Anesthesia Plan: Epidural   Post-op Pain Management:    Induction:   PONV Risk Score and Plan: 2 and Treatment may vary due to age or medical condition  Airway Management Planned: Natural Airway  Additional Equipment:   Intra-op Plan:   Post-operative Plan:   Informed Consent: I have reviewed the patients History and Physical, chart, labs and discussed the procedure including the risks, benefits and alternatives for the proposed anesthesia with the patient or authorized representative who has indicated his/her understanding and acceptance.     Dental advisory given  Plan Discussed with:   Anesthesia Plan Comments: (Patient identified. Risks/Benefits/Options discussed with patient including but not limited to bleeding, infection, nerve damage, paralysis, failed block, incomplete pain control, headache, blood pressure  changes, nausea, vomiting, reactions to medication both or allergic, itching and postpartum back pain. Confirmed with bedside nurse the patient's most recent platelet count. Confirmed with patient that they are not currently taking any anticoagulation, have any bleeding history or any family history of bleeding disorders. Patient expressed understanding and wished to proceed. All questions were answered. )        Anesthesia Quick Evaluation

## 2018-08-23 ENCOUNTER — Encounter (HOSPITAL_COMMUNITY): Payer: Self-pay

## 2018-08-23 LAB — CBC
HCT: 30.5 % — ABNORMAL LOW (ref 36.0–46.0)
Hemoglobin: 9.8 g/dL — ABNORMAL LOW (ref 12.0–15.0)
MCH: 26.3 pg (ref 26.0–34.0)
MCHC: 32.1 g/dL (ref 30.0–36.0)
MCV: 82 fL (ref 80.0–100.0)
Platelets: 167 10*3/uL (ref 150–400)
RBC: 3.72 MIL/uL — ABNORMAL LOW (ref 3.87–5.11)
RDW: 20.4 % — ABNORMAL HIGH (ref 11.5–15.5)
WBC: 19.6 10*3/uL — ABNORMAL HIGH (ref 4.0–10.5)
nRBC: 0 % (ref 0.0–0.2)

## 2018-08-23 NOTE — Progress Notes (Signed)
[name] was referred for history of depression/anxiety. * Referral screened out by Clinical Social Worker because none of the following criteria appear to apply: ~ History of anxiety/depression during this pregnancy, or of post-partum depression following prior delivery. ~ Diagnosis of anxiety and/or depression within last 3 years OR * [name]'s symptoms currently being treated with medication and/or therapy. Per [name]'s Kathryn Fox record, [name] has an active Rx for Zoloft.   Please contact the Clinical Social Worker if needs arise, by [facility] request, or if [name] scores greater than 9/yes to question 10 on [place] Postpartum Depression Screen.  Laurey Arrow, MSW, LCSW Clinical Social Work [phone]

## 2018-08-23 NOTE — Lactation Note (Signed)
This note was copied from a baby's chart. Lactation Consultation Note  Patient Name: Kathryn Fox Today's Date: 08/23/2018 Reason for consult: Initial assessment;Term Baby is 1 hours old and latching well to breast.  Mom prefers football hold.  She breastfed her first baby for 2 months before returning to work.  She hopes to breastfeed newborn longer.  Baby has done some cluster feeding.  Assisted with positioning baby in football hold.  Breast large and nipples erect.  Baby opened wide and latched easily to breast.  Good depth obtained and mom comfortable with feeding.  Baby became sleepy at breast and needed stimulation and breast massage.  Breastfeeding consultation services information given to patient.  Maternal Data Has patient been taught Hand Expression?: Yes Does the patient have breastfeeding experience prior to this delivery?: Yes  Feeding Feeding Type: Breast Fed  LATCH Score Latch: Grasps breast easily, tongue down, lips flanged, rhythmical sucking.  Audible Swallowing: A few with stimulation  Type of Nipple: Everted at rest and after stimulation  Comfort (Breast/Nipple): Soft / non-tender  Hold (Positioning): Assistance needed to correctly position infant at breast and maintain latch.  LATCH Score: 8  Interventions Interventions: Breast feeding basics reviewed;Assisted with latch;Breast compression;Skin to skin;Adjust position;Breast massage;Support pillows;Hand express;Position options  Lactation Tools Discussed/Used     Consult Status Consult Status: Follow-up Date: 08/24/18 Follow-up type: In-patient    Ave Filter 08/23/2018, 3:03 PM

## 2018-08-23 NOTE — Progress Notes (Signed)
Post Partum Day 1 Subjective: no complaints, up ad lib and tolerating PO  Objective: Blood pressure (!) 126/54, pulse 100, temperature 98.3 F (36.8 C), temperature source Oral, resp. rate 18, height 5\' 5"  (1.651 m), weight 88 kg, last menstrual period 10/30/2017, SpO2 96 %, unknown if currently breastfeeding.  Physical Exam:  General: alert and cooperative Lochia: appropriate Uterine Fundus: firm   Recent Labs    08/22/18 0807 08/23/18 0523  HGB 11.4* 9.8*  HCT 35.8* 30.5*    Assessment/Plan: Plan for discharge tomorrow  Feeling well Plans circumcision in office   LOS: 1 day   Logan Bores 08/23/2018, 9:06 AM

## 2018-08-23 NOTE — Anesthesia Postprocedure Evaluation (Signed)
Anesthesia Post Note  Patient: Kathryn Fox  Procedure(s) Performed: AN AD HOC LABOR EPIDURAL     Patient location during evaluation: Mother Baby Anesthesia Type: Epidural Level of consciousness: awake and alert Pain management: pain level controlled Vital Signs Assessment: post-procedure vital signs reviewed and stable Respiratory status: spontaneous breathing, nonlabored ventilation and respiratory function stable Cardiovascular status: stable Postop Assessment: no headache, no backache, epidural receding, no apparent nausea or vomiting, patient able to bend at knees, able to ambulate and adequate PO intake Anesthetic complications: no    Last Vitals:  Vitals:   08/22/18 2351 08/23/18 0406  BP: 130/73 (!) 126/54  Pulse: (!) 104 100  Resp: 18 18  Temp: 36.9 C 36.8 C  SpO2: 95% 96%    Last Pain:  Vitals:   08/23/18 0835  TempSrc:   PainSc: 8    Pain Goal: Patients Stated Pain Goal: 8 (08/22/18 1410)                 Solae Norling Hristova

## 2018-08-23 NOTE — Discharge Summary (Signed)
OB Discharge Summary     Patient Name: Kathryn Fox DOB: [birth year] MRN: [id number]  Date of admission: 08/22/2018 Delivering MD: Janyth Contes   Date of discharge: 08/24/2018  Admitting diagnosis: 39 WKS INDUCTION Intrauterine pregnancy: [redacted]w[redacted]d     Secondary diagnosis:  Principal Problem:   Vacuum-assisted vaginal delivery Active Problems:   Indication for care in labor or delivery  Additional problems: none     Discharge diagnosis: Term Pregnancy Delivered                                                                                                Post partum procedures:none  Augmentation: AROM and Pitocin  Complications: None  Hospital course:  Induction of Labor With Vaginal Delivery   28 y.o. yo G2P2002 at [redacted]w[redacted]d was admitted to the hospital 08/22/2018 for induction of labor.  Indication for induction: Favorable cervix at term.  Patient had an uncomplicated labor course as follows: Membrane Rupture Time/Date: 8:39 AM ,08/22/2018   Intrapartum Procedures: Episiotomy: None [1]                                         Lacerations:  Labial [10];Perineal [11]  Patient had delivery of a Viable infant.  Information for the patient's newborn:  Kneisley, Boy Deyona [[id number]]  Delivery Method: Vaginal, Vacuum (Extractor)(Filed from Delivery Summary)   08/22/2018  Details of delivery can be found in separate delivery note.  Patient had a routine postpartum course. Patient is discharged home 08/24/18.  Physical exam  Vitals:   08/23/18 1230 08/23/18 1441 08/23/18 2154 08/24/18 0540  BP: 118/66 121/78 112/74 118/72  Pulse: 88 93 79 97  Resp: 18 16 18 18   Temp: 97.9 F (36.6 C) 97.8 F (36.6 C) 98.3 F (36.8 C) 98.5 F (36.9 C)  TempSrc:  Oral Oral Oral  SpO2:   99%   Weight:      Height:       General: alert and cooperative Lochia: appropriate Uterine Fundus: firm  Labs: Lab Results  Component Value Date   WBC 19.6 (H) 08/23/2018   HGB 9.8 (L) 08/23/2018    HCT 30.5 (L) 08/23/2018   MCV 82.0 08/23/2018   PLT 167 08/23/2018   CMP Latest Ref Rng & Units 07/18/2018  Glucose 70 - 99 mg/dL 100(H)  BUN 6 - 20 mg/dL 5(L)  Creatinine 0.44 - 1.00 mg/dL 0.65  Sodium 135 - 145 mmol/L 135  Potassium 3.5 - 5.1 mmol/L 3.6  Chloride 98 - 111 mmol/L 107  CO2 22 - 32 mmol/L 23  Calcium 8.9 - 10.3 mg/dL 8.4(L)  Total Protein 6.5 - 8.1 g/dL 6.2(L)  Total Bilirubin 0.3 - 1.2 mg/dL 0.2(L)  Alkaline Phos 38 - 126 U/L 128(H)  AST 15 - 41 U/L 9(L)  ALT 0 - 44 U/L 9    Discharge instruction: per After Visit Summary and "Baby and Me Booklet".  After visit meds:  Allergies as of 08/24/2018      Reactions  Mushroom Extract Complex Anaphylaxis   Facial swelling   Peanut-containing Drug Products Anaphylaxis   Penicillins Anaphylaxis   Septra [sulfamethoxazole-trimethoprim] Hives      Medication List    STOP taking these medications   COMFORT FIT MATERNITY SUPP SM Misc   cyclobenzaprine 10 MG tablet Commonly known as:  FLEXERIL   pantoprazole 40 MG tablet Commonly known as:  PROTONIX     TAKE these medications   ibuprofen 600 MG tablet Commonly known as:  ADVIL,MOTRIN Take 1 tablet (600 mg total) by mouth every 6 (six) hours.   IRON PO Take 1 tablet by mouth daily.   oxyCODONE 5 MG immediate release tablet Commonly known as:  Oxy IR/ROXICODONE Take 1 tablet (5 mg total) by mouth every 4 (four) hours as needed (pain scale 4-7).   PRENATAL VITAMIN PO Take by mouth.   valACYclovir 500 MG tablet Commonly known as:  VALTREX Take 500 mg by mouth daily.       Diet: routine diet  Activity: Advance as tolerated. Pelvic rest for 6 weeks.   Outpatient follow up:6 weeks Follow up Appt: Future Appointments  Date Time Provider [place]  09/12/2018  2:30 PM [facility] None  09/12/2018  3:00 PM Brunetta Genera, MD [facility] None   Follow up Visit:No follow-ups on file.  Postpartum contraception:  Undecided  Newborn Data: Live born female  Birth Weight: 7 lb 5.8 oz (3340 g) APGAR: 8, 9  Newborn Delivery   Birth date/time:  08/22/2018 20:36:00 Delivery type:  Vaginal, Vacuum (Extractor)     Baby Feeding: Breast Disposition:home with mother   08/24/2018 Logan Bores, MD

## 2018-08-24 MED ORDER — OXYCODONE HCL 5 MG PO TABS: 5.0000 mg | ORAL_TABLET | ORAL | 0 refills | Status: DC | PRN

## 2018-08-24 MED ORDER — IBUPROFEN 600 MG PO TABS: 600.0000 mg | ORAL_TABLET | Freq: Four times a day (QID) | ORAL | 0 refills | Status: DC

## 2018-08-24 NOTE — Progress Notes (Signed)
Post Partum Day 2 Subjective: no complaints, up ad lib and tolerating PO  Objective: Blood pressure 118/72, pulse 97, temperature 98.5 F (36.9 C), temperature source Oral, resp. rate 18, height 5\' 5"  (1.651 m), weight 88 kg, last menstrual period 10/30/2017, SpO2 99 %, unknown if currently breastfeeding.  Physical Exam:  General: alert and cooperative Lochia: appropriate Uterine Fundus: firm   Recent Labs    08/22/18 0807 08/23/18 0523  HGB 11.4* 9.8*  HCT 35.8* 30.5*    Assessment/Plan: Discharge home, may have to room in with baby for jaundice.  Waiting on level to return Plans circ in office   LOS: 2 days   Logan Bores 08/24/2018, 10:19 AM

## 2018-08-24 NOTE — Lactation Note (Signed)
This note was copied from a baby's chart. Lactation Consultation Note; Mom called out for assist with latch. Reports he was fussy during the night and wanting to feed a lot. Did give bottle of formula two times during the night. Baby has pacifier in mouth as I went into room. Suggested to put him to the breast instead of giving pacifier. Baby latched easily, nursed for 15 min then let go and off to sleep. Mom complaining of cramping and needed pain medicine. RN giving pain med. No questions at present. Reviewed our phone number, OP appointments and BFS[facility] as resources for support after DC. To call prn  Patient Name: Kathryn Fox Today's Date: 08/24/2018 Reason for consult: Follow-up assessment   Maternal Data Formula Feeding for Exclusion: No Has patient been taught Hand Expression?: Yes Does the patient have breastfeeding experience prior to this delivery?: Yes  Feeding  LATCH Score Latch: Grasps breast easily, tongue down, lips flanged, rhythmical sucking.  Audible Swallowing: A few with stimulation  Type of Nipple: Everted at rest and after stimulation  Comfort (Breast/Nipple): Soft / non-tender  Hold (Positioning): Assistance needed to correctly position infant at breast and maintain latch.  LATCH Score: 8  Interventions Interventions: Breast feeding basics reviewed;Assisted with latch;Breast massage;Breast compression;Hand express  Lactation Tools Discussed/Used     Consult Status Consult Status: Complete    Truddie Crumble 08/24/2018, 9:12 AM

## 2018-09-11 ENCOUNTER — Other Ambulatory Visit: Payer: Self-pay | Admitting: *Deleted

## 2018-09-11 DIAGNOSIS — O99013 Anemia complicating pregnancy, third trimester: Secondary | ICD-10-CM

## 2018-09-11 DIAGNOSIS — D509 Iron deficiency anemia, unspecified: Secondary | ICD-10-CM

## 2018-09-12 ENCOUNTER — Inpatient Hospital Stay: Payer: Medicaid Other | Attending: Nurse Practitioner | Admitting: Hematology

## 2018-09-12 ENCOUNTER — Inpatient Hospital Stay: Payer: Medicaid Other

## 2018-10-04 DIAGNOSIS — Z124 Encounter for screening for malignant neoplasm of cervix: Secondary | ICD-10-CM | POA: Diagnosis not present

## 2018-12-03 DIAGNOSIS — Z30431 Encounter for routine checking of intrauterine contraceptive device: Secondary | ICD-10-CM | POA: Diagnosis not present

## 2019-02-05 ENCOUNTER — Telehealth: Payer: Self-pay | Admitting: Licensed Clinical Social Worker

## 2019-02-05 NOTE — Telephone Encounter (Signed)
   Unsuccessful Phone Outreach Note  02/05/2019 Name: Kathryn Fox MRN: [id number] DOB: March 29, 1991  Referred by: Dr. Maia Breslow during visit with patient's baby Reason for referral : Depression and Care Coordination for therapy services   Kathryn Fox is a 28 y.o. year old female who sees Benay Pike, MD for primary care. LCSW attempted to call patient to assess for needs and barriers based on the above referral. An unsuccessful telephone outreach to patient was attempted today.  Unable to leave voice message due to full mailbox  Follow Up Plan:. LCSW will call again in 3 to 5 days to assist patient with connecting to counseling resources.  Casimer Lanius, [facility]   [phone] 4:04 PM

## 2019-02-10 ENCOUNTER — Ambulatory Visit (INDEPENDENT_AMBULATORY_CARE_PROVIDER_SITE_OTHER): Payer: Medicaid Other | Admitting: Family Medicine

## 2019-02-10 ENCOUNTER — Encounter: Payer: Self-pay | Admitting: Family Medicine

## 2019-02-10 ENCOUNTER — Other Ambulatory Visit: Payer: Self-pay

## 2019-02-10 VITALS — BP 118/78 | HR 91 | Wt 167.4 lb

## 2019-02-10 DIAGNOSIS — F339 Major depressive disorder, recurrent, unspecified: Secondary | ICD-10-CM | POA: Diagnosis not present

## 2019-02-10 DIAGNOSIS — F331 Major depressive disorder, recurrent, moderate: Secondary | ICD-10-CM

## 2019-02-10 DIAGNOSIS — D509 Iron deficiency anemia, unspecified: Secondary | ICD-10-CM | POA: Diagnosis not present

## 2019-02-10 MED ORDER — CITALOPRAM HYDROBROMIDE 10 MG PO TABS: 10.0000 mg | ORAL_TABLET | Freq: Every day | ORAL | 1 refills | Status: DC

## 2019-02-10 NOTE — Patient Instructions (Addendum)
It was great to see today,  I have prescribed you a medicine for depression called Celexa.  He should take this medication once a day.  You can choose to take it in the morning or at night, but make sure that is the same time every day.  These medicines can take time to work.  I would not expect to see any change in the first 2 weeks, but more likely it may be a month before you start to notice anything different.  Therefore I would like to see you back 6 weeks so that we can reevaluate and adjust the medication if necessary.  If you need to talk to [place] before that time you can call to make an appointment.  I will call you with results of any lab work if they are abnormal.  Have a great day,  Clemetine Marker, MD  Citalopram tablets What is this medicine? CITALOPRAM (sye TAL oh pram) is a medicine for depression. This medicine may be used for other purposes; ask your health care provider or pharmacist if you have questions. COMMON BRAND NAME(S): Celexa What should I tell my health care provider before I take this medicine? They need to know if you have any of these conditions:  bleeding disorders  bipolar disorder or a family history of bipolar disorder  glaucoma  heart disease  history of irregular heartbeat  kidney disease  liver disease  low levels of magnesium or potassium in the blood  receiving electroconvulsive therapy  seizures  suicidal thoughts, plans, or attempt; a previous suicide attempt by you or a family member  take medicines that treat or prevent blood clots  thyroid disease  an unusual or allergic reaction to citalopram, escitalopram, other medicines, foods, dyes, or preservatives  pregnant or trying to become pregnant  breast-feeding How should I use this medicine? Take this medicine by mouth with a glass of water. Follow the directions on the prescription label. You can take it with or without food. Take your medicine at regular intervals. Do not take your  medicine more often than directed. Do not stop taking this medicine suddenly except upon the advice of your doctor. Stopping this medicine too quickly may cause serious side effects or your condition may worsen. A special MedGuide will be given to you by the pharmacist with each prescription and refill. Be sure to read this information carefully each time. Talk to your pediatrician regarding the use of this medicine in children. Special care may be needed. Patients over 73 years old may have a stronger reaction and need a smaller dose. Overdosage: If you think you have taken too much of this medicine contact a poison control center or emergency room at once. NOTE: This medicine is only for you. Do not share this medicine with others. What if I miss a dose? If you miss a dose, take it as soon as you can. If it is almost time for your next dose, take only that dose. Do not take double or extra doses. What may interact with this medicine? Do not take this medicine with any of the following medications:  certain medicines for fungal infections like fluconazole, itraconazole, ketoconazole, posaconazole, voriconazole  cisapride  dronedarone  escitalopram  linezolid  MAOIs like Carbex, Eldepryl, Marplan, Nardil, and Parnate  methylene blue (injected into a vein)  pimozide  thioridazine This medicine may also interact with the following medications:  alcohol  amphetamines  aspirin and aspirin-like medicines  carbamazepine  certain medicines for depression,  anxiety, or psychotic disturbances  certain medicines for infections like chloroquine, clarithromycin, erythromycin, furazolidone, isoniazid, pentamidine  certain medicines for migraine headaches like almotriptan, eletriptan, frovatriptan, naratriptan, rizatriptan, sumatriptan, zolmitriptan  certain medicines for sleep  certain medicines that treat or prevent blood clots like dalteparin, enoxaparin,  warfarin  cimetidine  diuretics  dofetilide  fentanyl  lithium  methadone  metoprolol  NSAIDs, medicines for pain and inflammation, like ibuprofen or naproxen  omeprazole  other medicines that prolong the QT interval (cause an abnormal heart rhythm)  procarbazine  rasagiline  supplements like St. John's wort, kava kava, valerian  tramadol  tryptophan  ziprasidone This list may not describe all possible interactions. Give your health care provider a list of all the medicines, herbs, non-prescription drugs, or dietary supplements you use. Also tell them if you smoke, drink alcohol, or use illegal drugs. Some items may interact with your medicine. What should I watch for while using this medicine? Tell your doctor if your symptoms do not get better or if they get worse. Visit your doctor or health care professional for regular checks on your progress. Because it may take several weeks to see the full effects of this medicine, it is important to continue your treatment as prescribed by your doctor. Patients and their families should watch out for new or worsening thoughts of suicide or depression. Also watch out for sudden changes in feelings such as feeling anxious, agitated, panicky, irritable, hostile, aggressive, impulsive, severely restless, overly excited and hyperactive, or not being able to sleep. If this happens, especially at the beginning of treatment or after a change in dose, call your health care professional. Dennis Bast may get drowsy or dizzy. Do not drive, use machinery, or do anything that needs mental alertness until you know how this medicine affects you. Do not stand or sit up quickly, especially if you are an older patient. This reduces the risk of dizzy or fainting spells. Alcohol may interfere with the effect of this medicine. Avoid alcoholic drinks. Your mouth may get dry. Chewing sugarless gum or sucking hard candy, and drinking plenty of water will help. Contact  your doctor if the problem does not go away or is severe. What side effects may I notice from receiving this medicine? Side effects that you should report to your doctor or health care professional as soon as possible:  allergic reactions like skin rash, itching or hives, swelling of the face, lips, or tongue  anxious  black, tarry stools  breathing problems  changes in vision  chest pain  confusion  elevated mood, decreased need for sleep, racing thoughts, impulsive behavior  eye pain  fast, irregular heartbeat  feeling faint or lightheaded, falls  feeling agitated, angry, or irritable  hallucination, loss of contact with reality  loss of balance or coordination  loss of memory  painful or prolonged erections  restlessness, pacing, inability to keep still  seizures  stiff muscles  suicidal thoughts or other mood changes  trouble sleeping  unusual bleeding or bruising  unusually weak or tired  vomiting Side effects that usually do not require medical attention (report to your doctor or health care professional if they continue or are bothersome):  change in appetite or weight  change in sex drive or performance  dizziness  headache  increased sweating  indigestion, nausea  tremors This list may not describe all possible side effects. Call your doctor for medical advice about side effects. You may report side effects to FDA at 1-800-FDA-1088. Where  should I keep my medicine? Keep out of reach of children. Store at room temperature between 15 and 30 degrees C (59 and 86 degrees F). Throw away any unused medicine after the expiration date. NOTE: This sheet is a summary. It may not cover all possible information. If you have questions about this medicine, talk to your doctor, pharmacist, or health care provider.  2020 Elsevier/Gold Standard (2018-07-01 09:05:36)

## 2019-02-10 NOTE — Progress Notes (Signed)
   [facility] Phone: [phone]   cc: Depression  Subjective:  Depression: Patient states that she was prescribed Zoloft during her pregnancy for depression and this helped with her depression, but made her feel sleepy.  She states she is feeling depressed currently because of her situation in which she is having to raise 2 children, a 54-month-old boy and a 30-year-old girl by herself.  Her child's father recently left them and "left her with all the bills".  Patient then had to move in with her sister.  Patient is working at [employer] 30 hours a week.  The patient's children are at daycare while at work.  Patient states she is sleeping less, going to bed later.  She is also eating less, now eating 1 meal a day.  This is usually lunch.  She still states she enjoys taking the children to the park.  She does not think she is more irritable, but is more angry towards her child's father's side of the family.  The patient states she has never had any episodes where she stays up for several days with requiring little sleep, has not done any impulsive spending other than buying close for her children that she thinks is probably unnecessary.  She does admit to buying lottery tickets twice a week.  The most she is ever spent is $100.  Patient is not breast-feeding  Anemia: Patient states she was diagnosed with anemia during pregnancy, and had to receive IV iron.  States she is not currently taking iron supplementation.  Not feeling fatigue or dyspnea on exertion.   ROS: See HPI for pertinent positives and negatives  Past Medical History  Family history reviewed for today's visit. No changes.  Objective: BP 118/78   Pulse 91   Wt 167 lb 6.4 oz (75.9 kg)   LMP 01/11/2019 (Approximate)   SpO2 98%   BMI 27.86 kg/m  Gen: NAD, alert and oriented, cooperative with exam Neck: FROM, supple, no masses, no thyromegaly CV: normal rate, regular rhythm. No murmurs, no rubs.  Resp: LCTAB,  no wheezes, crackles. normal work of breathing GI: nontender to palpation, BS present, no guarding or organomegaly Psych: Affect mildly blunted.  Patient has good eye contact and spontaneous speech  Assessment/Plan: Depression Patient who was on Zoloft for depression during pregnancy, came to office for more complaints of depression.  Not currently on any medication as she states the Zoloft was making her tired.  It is possible her anemia is what caused her to be tired during her pregnancy.  Patient denies any manic symptoms or history of manic symptoms.  The [place] depression scale filled out and positive.  Given the patient's stressful home environment, it is not surprising she is feeling depressed. - Celexa 10 mg daily - Labs: TSH, CBC, ferritin -Advised patient to follow-up in 6 weeks for reevaluation  Iron deficiency anemia Patient was anemic during pregnancy.  Has not had any follow-up labs since delivery.  During pregnancy ferritin was 4.  Patient was given IV iron.  Patient not currently taking any iron supplementation. - Labs: CBC and ferritin - Discussed iron supplementation if patient anemic, although she experienced significant GI symptoms during pregnancy with oral iron.  May start with every other day dosing.    Clemetine Marker, MD PGY-2

## 2019-02-11 LAB — CBC
Hematocrit: 39.1 % (ref 34.0–46.6)
Hemoglobin: 12.6 g/dL (ref 11.1–15.9)
MCH: 27.5 pg (ref 26.6–33.0)
MCHC: 32.2 g/dL (ref 31.5–35.7)
MCV: 85 fL (ref 79–97)
Platelets: 282 10*3/uL (ref 150–450)
RBC: 4.59 x10E6/uL (ref 3.77–5.28)
RDW: 12.2 % (ref 11.7–15.4)
WBC: 10.7 10*3/uL (ref 3.4–10.8)

## 2019-02-11 LAB — FERRITIN: Ferritin: 84 ng/mL (ref 15–150)

## 2019-02-11 LAB — TSH: TSH: 1.41 u[IU]/mL (ref 0.450–4.500)

## 2019-02-11 NOTE — Assessment & Plan Note (Signed)
Patient was anemic during pregnancy.  Has not had any follow-up labs since delivery.  During pregnancy ferritin was 4.  Patient was given IV iron.  Patient not currently taking any iron supplementation. - Labs: CBC and ferritin - Discussed iron supplementation if patient anemic, although she experienced significant GI symptoms during pregnancy with oral iron.  May start with every other day dosing.

## 2019-02-11 NOTE — Assessment & Plan Note (Signed)
Patient who was on Zoloft for depression during pregnancy, came to office for more complaints of depression.  Not currently on any medication as she states the Zoloft was making her tired.  It is possible her anemia is what caused her to be tired during her pregnancy.  Patient denies any manic symptoms or history of manic symptoms.  The [place] depression scale filled out and positive.  Given the patient's stressful home environment, it is not surprising she is feeling depressed. - Celexa 10 mg daily - Labs: TSH, CBC, ferritin -Advised patient to follow-up in 6 weeks for reevaluation

## 2019-02-12 NOTE — Telephone Encounter (Addendum)
   Social Work Outreach Note  02/12/2019 Name: Kathryn Fox MRN: [id number] DOB: [birth year]  Referred by: Benay Pike, MD Reason for referral : Depression and Care Coordination  A second unsuccessful telephone outreach was attempted today. The patient was referred to  LCSW for assistance with managing symptoms of depression and care coordination.   Follow Up Plan: A HIPPA compliant phone message was left for the patient providing contact information and requesting a return call. if no return call is received LCSW will call again in 5 to 7 days.   Casimer Lanius, [facility]   [phone] 3:38 PM

## 2019-02-16 ENCOUNTER — Encounter: Payer: Self-pay | Admitting: Family Medicine

## 2019-02-17 ENCOUNTER — Telehealth: Payer: Self-pay | Admitting: *Deleted

## 2019-02-17 NOTE — Telephone Encounter (Signed)
-----   Message from Benay Pike, MD sent at 02/16/2019 12:58 PM EDT ----- Regarding: Follow-up appointment I noticed when looking of this patient's lab work today that she did not make a follow-up appointment.  I started her on a SSRI during her last appointment and would like to see her back in 4 weeks.  Can we please call her to see if we can schedule something.Thank you,Dan

## 2019-02-18 NOTE — Telephone Encounter (Signed)
LVM to call office to schedule a follow up appointment with Dr. Jeannine Kitten.  He wanted her to come back in 4 weeks which would be week or Aug 10-14.  Zebulin Siegel Zimmerman Rumple, CMA

## 2019-02-20 NOTE — Telephone Encounter (Addendum)
Care Coordination  Telephone Outreach Note  02/20/2019 Name: Sinahi Knights Rogue MRN: [id number] DOB: 08/06/1990  Referred by: Benay Pike, MD Reason for referral : Depression and Care Coordination  Larine Fielding Wass is a 28 y.o. year old female who sees Benay Pike, MD for primary care. F/U call to patient for care coordination due to symptoms of depression.  Plan: phone appointment scheduled Aug. 4th at 11:30 with LCSW.  Casimer Lanius, LCSW Clinical Social Worker [place] / [place]   [phone] 4:29 PM

## 2019-02-25 ENCOUNTER — Ambulatory Visit: Payer: Medicaid Other | Admitting: Licensed Clinical Social Worker

## 2019-02-25 ENCOUNTER — Other Ambulatory Visit: Payer: Self-pay

## 2019-02-25 DIAGNOSIS — F331 Major depressive disorder, recurrent, moderate: Secondary | ICD-10-CM

## 2019-02-25 DIAGNOSIS — Z7189 Other specified counseling: Secondary | ICD-10-CM

## 2019-02-25 NOTE — BH Specialist Note (Signed)
Integrated Behavioral Health Initial Visit  MRN: [id number] Name: Kathryn Fox Number of [facility] Clinician visits: 1/6 Session Start time: 11:00  Session End time: 11:30 Total time: 30 minutes  Type of Visit: Telephonic Patient location: home LCSW  location: [facility] office persons other than patient participating in visit: none  Confirmed patient's name: Yes  Confirmed patient's date of birth: Yes  Confirmed patient's insurance: (not billing for service) Yes  Discussed confidentiality: Yes   "The purpose of this phone visit is to provide behavioral health care while limiting exposure to the coronavirus (COVID19). " "By engaging in this telephone visit, you consent to the provision of healthcare."   Review of patient's consultants notes from appropriate care team members was performed as part of care coordination referral SUBJECTIVE: Kathryn Fox is a 28 y.o. year old female who sees Benay Pike, MD for primary care.  Referred by Dr. Shan Levans for assistance with managing symptoms of depression. Report of symptoms: Patient continues to take10mg  of Celexa as prescribed by provider.  Reports she is not as emotional, not over thinking things, crying less, and noticed an increase in appetite.     ASSESSMENT: Patient is pleasant and engaged in conversation. She is currently experiencing symptoms of depression  which are exacerbated by managing life stressors and recent birth of her son. Patient has a long history of managing symptom of depression with mood swings and learned how to manage, however with recent life stressors she has not been able to manage. Patient may benefit from and is in agreement to receive further assessment and therapeutic interventions from ongoing counseling to assist with managing her symptoms.     GOALS: 1. Reduce symptoms of: depression and stress 2. Increase knowledge and/or ability of: coping skills, self-management skills and stress  reduction  3. Demonstrate ability to: Increase healthy adjustment to current life circumstances  Recommendation for patient:  1. call counseling agencies provided ( [facility] and [employer]) 2.   Review educational information provided on Depression, relaxation techniques  3.   Continue walking and listening to relaxing music  Plan: F/U phone appointment schedule with LCSW in 1 week for continue care coordination and brief interventions until connected for ongoing counseling.  _______________________________________________________  Duration of CURRENT symptoms:symptoms of depression  Age of onset of first mood disturbance: per patient she has a diagnosis of bipolar depression as a teenager but never took medication Impact on function:difficult managing work and personal life Psychiatric History - Diagnoses: depression ( per patient diagnosed with bipolar depression as a teenager)  - Hospitalizations:  none - Pharmacotherapy: currently Celexa 10mg ; hx of zoloft but did not do well with it - Outpatient therapy: when she was a teenager Current and history of substance [id number]  Risk of harm to self or others: No plan to harm self or others Resources Utilized in the past: none identified  LIFE CONTEXT: Family and Social: currently living with sister School/Work: works Publishing copy: walks, listen to music, Life Changes: relationship break up with children's father, had to move out of the home Strengths: Capable of independent living Agricultural engineer for treatment/growth Support System: Supportive Relationships, Family and Hopefulness  INTERVENTION: Client interviewed and appropriate assessments performed. Provided client with educational information from [facility] on depression and relaxation techniques ; also utilized engagement as part of my intervention.  Other interventions include:  Solution-Focused Strategies,   Psychoeducation demonstration of relaxed breathing.  Casimer Lanius, LCSW Clinical  Social Worker [place] / [place]   [phone] 1:49 PM

## 2019-03-05 ENCOUNTER — Ambulatory Visit: Payer: Medicaid Other

## 2019-03-05 ENCOUNTER — Other Ambulatory Visit: Payer: Self-pay

## 2019-03-05 ENCOUNTER — Encounter: Payer: Self-pay | Admitting: Family Medicine

## 2019-03-05 ENCOUNTER — Telehealth: Payer: Self-pay | Admitting: Licensed Clinical Social Worker

## 2019-03-05 ENCOUNTER — Ambulatory Visit (INDEPENDENT_AMBULATORY_CARE_PROVIDER_SITE_OTHER): Payer: Medicaid Other | Admitting: Family Medicine

## 2019-03-05 DIAGNOSIS — F321 Major depressive disorder, single episode, moderate: Secondary | ICD-10-CM | POA: Diagnosis not present

## 2019-03-05 DIAGNOSIS — F331 Major depressive disorder, recurrent, moderate: Secondary | ICD-10-CM | POA: Diagnosis not present

## 2019-03-05 MED ORDER — CITALOPRAM HYDROBROMIDE 10 MG PO TABS: 10.0000 mg | ORAL_TABLET | Freq: Every day | ORAL | 1 refills | Status: DC

## 2019-03-05 NOTE — Progress Notes (Signed)
   [facility] Phone: [phone]     Kathryn Fox - 28 y.o. female MRN [id number]  Date of birth: 1990-11-05  Subjective:   cc: Depression  HPI:  Depression: Patient states she is feeling much better since starting the Celexa.  Is not experiencing any side effects such as difficulty sleeping or gastrointestinal discomfort.   No thoughts of harming herself.  ROS: See HPI for pertinent positives and negatives  Past Medical History  Family history reviewed for today's visit. No changes.  Social history- patient is a former-smoker  Health Maintenance:  -  Health Maintenance Due  Topic Date Due  . INFLUENZA VACCINE  02/22/2019    Objective:   BP 106/64   LMP 01/01/2019 (Approximate)   Breastfeeding No  Gen: NAD, alert and oriented, cooperative with exam CV: normal rate, regular rhythm. No murmurs, no rubs.  Resp: LCTAB, no wheezes, crackles. normal work of breathing Psych: Affect much improved since last visit.  Patient smiling.  Assessment/Plan:   Depression Patient is reporting improvement in symptoms since starting medication.  On exam, she appears much happier today than last visit.  Not experiencing any side effects. - Follow-up in 1 month for phone visit followed by a in person visit at 6 months where we can discuss if patient would like to discontinue medication at that time. -Refilled medication   Meds ordered this encounter  Medications  . citalopram (CELEXA) 10 MG tablet    Sig: Take 1 tablet (10 mg total) by mouth daily.    Dispense:  90 tablet    Refill:  1     Clemetine Marker, MD PGY-2 [facility]

## 2019-03-05 NOTE — Telephone Encounter (Signed)
   Unsuccessful Phone Outreach Note  03/05/2019 Name: Kathryn Fox MRN: [id number] DOB: [birth year]  Referred by: Benay Pike, MD, Reason for referral : Other (F/U phone appointment) .  Kathryn Fox is a 28 y.o. year old female who sees Jeannine Kitten, Garen Lah, MD for primary care.    F/U phone appointment scheduled with patient to day to see if patient connected to resources provided.  Telephone outreach was unsuccessful. A HIPPA compliant phone message was left for the patient providing contact information and requesting a return to complete appointment if she was able to call back within 15 min of appointment time.  If it was after patient could call to reschedule appointment if needed.  Plan:  LCSW will wait for patient to call if she would like to reschedule the appointment.  Casimer Lanius, LCSW Clinical Social Worker [place] / [place]   [phone] 11:38 AM

## 2019-03-05 NOTE — Patient Instructions (Signed)
It was great to see you again,  I am glad to see that you are doing much better.  We will continue the current dose of your Celexa and then in 6 months I would like to see you back so we can discuss if you would like to continue on it or start tapering off.  If you have any issues between now and then feel free to make another appointment.  Have a great day,  Clemetine Marker, MD

## 2019-03-09 NOTE — Assessment & Plan Note (Signed)
Patient is reporting improvement in symptoms since starting medication.  On exam, she appears much happier today than last visit.  Not experiencing any side effects. - Follow-up in 1 month for phone visit followed by a in person visit at 6 months where we can discuss if patient would like to discontinue medication at that time. -Refilled medication

## 2019-04-03 DIAGNOSIS — Z20828 Contact with and (suspected) exposure to other viral communicable diseases: Secondary | ICD-10-CM | POA: Diagnosis not present

## 2019-04-07 DIAGNOSIS — F321 Major depressive disorder, single episode, moderate: Secondary | ICD-10-CM | POA: Diagnosis not present

## 2019-04-14 DIAGNOSIS — F321 Major depressive disorder, single episode, moderate: Secondary | ICD-10-CM | POA: Diagnosis not present

## 2019-04-28 DIAGNOSIS — F321 Major depressive disorder, single episode, moderate: Secondary | ICD-10-CM | POA: Diagnosis not present

## 2019-05-01 DIAGNOSIS — Z1159 Encounter for screening for other viral diseases: Secondary | ICD-10-CM | POA: Diagnosis not present

## 2019-05-09 ENCOUNTER — Other Ambulatory Visit: Payer: Self-pay

## 2019-05-09 ENCOUNTER — Telehealth (INDEPENDENT_AMBULATORY_CARE_PROVIDER_SITE_OTHER): Payer: Medicaid Other | Admitting: Family Medicine

## 2019-05-09 DIAGNOSIS — R059 Cough, unspecified: Secondary | ICD-10-CM | POA: Insufficient documentation

## 2019-05-09 DIAGNOSIS — R05 Cough: Secondary | ICD-10-CM | POA: Diagnosis not present

## 2019-05-09 DIAGNOSIS — R062 Wheezing: Secondary | ICD-10-CM | POA: Insufficient documentation

## 2019-05-09 MED ORDER — ALBUTEROL SULFATE HFA 108 (90 BASE) MCG/ACT IN AERS: 2.0000 | INHALATION_SPRAY | RESPIRATORY_TRACT | 0 refills | Status: DC | PRN

## 2019-05-09 MED ORDER — BENZONATATE 100 MG PO CAPS: 100.0000 mg | ORAL_CAPSULE | Freq: Two times a day (BID) | ORAL | 0 refills | Status: DC | PRN

## 2019-05-09 NOTE — Progress Notes (Signed)
[facility] Telemedicine Visit  Patient consented to have virtual visit. Method of visit: Video  Encounter participants: Patient: Kathryn Fox - located at home Provider: Cleophas Dunker - located at [facility] Others (if applicable): none  Chief Complaint: coughing, wheezing  HPI: Kathryn Fox is a 28 yo female with PMH anemia and tobacco abuse, who presents to discuss the following:  1 week ago Started to have sore throat, then started to have congestion and rhinorrhea No fever (has been checking), but was feeling very warm and sweating a lot Cough started about 1 week ago, yellow sputum Had loss of taste and smell at first [facility] to Urgent Care and reports that she was tested for [place] 10/8 due to symptoms and she states that the test was negative She has been quarantining since 10/8 Having some wheezing, feels congestion in her chest, denies chest pain but thinks she had some chest pain when this first started States that she is still wheezing but not as bad as she was Was having SOB when walking and at rest for a few days, but now that has resolved Able to ambulate without SOB Has tried OTC cold medication that she thinks is generic for Robitussin, just makes her sleep but not helping Has not tried anything else States at first she was 10/10 but now she is 8/10, feeling somewhat better Feeling better since 10/12 No known sick contacts, but thinks some coworkers might be sick Works at [employer] in [employer] off and on, hasn't smoked for about a month  ROS: per HPI  Pertinent PMHx: severe anemia (resolved in July 2020), GERD, migraines  Exam:  Respiratory: Breathing comfortably on RA, sitting up, in NAD  Assessment/Plan:  Cough Reassured that patient is not having any chest pain, shortness of breath has resolved, no fevers.  Also with negative COVID test.  Given that she has been improving, will opt not to treat with antibiotics, as  likely is viral in origin and will improve in 3 to 5 days.  Patient advised of this, voiced understanding.  Will send Tessalon Perles for cough as needed.  Advised that given negative cover test no improvement in symptoms, as well as lack of fever, patient can discontinue quarantine.  Also noted can use honey as needed for coughing.  If worsening in symptoms, shortness of breath, fever, chest pain, she should return to care, made aware of this.  Wheezing Doubt pneumonia as patient has no shortness of breath at present, no fevers, no chest pain.  Reassured that she continues to improve.  Given occasional wheezing, will trial albuterol inhaler.  Patient notes that her daughter has albuterol inhaler and she is aware how to use this.    Time spent during visit with patient: 17 minutes

## 2019-05-09 NOTE — Assessment & Plan Note (Signed)
Doubt pneumonia as patient has no shortness of breath at present, no fevers, no chest pain.  Reassured that she continues to improve.  Given occasional wheezing, will trial albuterol inhaler.  Patient notes that her daughter has albuterol inhaler and she is aware how to use this.

## 2019-05-09 NOTE — Assessment & Plan Note (Signed)
Reassured that patient is not having any chest pain, shortness of breath has resolved, no fevers.  Also with negative COVID test.  Given that she has been improving, will opt not to treat with antibiotics, as likely is viral in origin and will improve in 3 to 5 days.  Patient advised of this, voiced understanding.  Will send Tessalon Perles for cough as needed.  Advised that given negative cover test no improvement in symptoms, as well as lack of fever, patient can discontinue quarantine.  Also noted can use honey as needed for coughing.  If worsening in symptoms, shortness of breath, fever, chest pain, she should return to care, made aware of this.

## 2019-06-09 DIAGNOSIS — F321 Major depressive disorder, single episode, moderate: Secondary | ICD-10-CM | POA: Diagnosis not present

## 2019-06-23 DIAGNOSIS — F321 Major depressive disorder, single episode, moderate: Secondary | ICD-10-CM | POA: Diagnosis not present

## 2019-07-14 DIAGNOSIS — F321 Major depressive disorder, single episode, moderate: Secondary | ICD-10-CM | POA: Diagnosis not present

## 2019-07-28 DIAGNOSIS — F321 Major depressive disorder, single episode, moderate: Secondary | ICD-10-CM | POA: Diagnosis not present

## 2019-08-18 DIAGNOSIS — F321 Major depressive disorder, single episode, moderate: Secondary | ICD-10-CM | POA: Diagnosis not present

## 2019-09-01 DIAGNOSIS — F321 Major depressive disorder, single episode, moderate: Secondary | ICD-10-CM | POA: Diagnosis not present

## 2019-09-06 ENCOUNTER — Telehealth: Payer: Self-pay | Admitting: Student in an Organized Health Care Education/Training Program

## 2019-09-06 NOTE — Telephone Encounter (Signed)
**  After Hours/ Emergency Line Call*  S: Patient called to request medication be sent to the pharmacy for her because she is having a panic attack currently.  Denies SI, HI.  O: Patient speaking very calmly and collected. Breathing regular, without dyspnea. No sense of panic in her voice. Does not sound to be in acute distress.   A/P: Recommended patient call 911 or have someone bring her to the ED if she needs immediate assistance for her panic attack.  Patient was understanding of this recommendation.

## 2019-09-22 DIAGNOSIS — F321 Major depressive disorder, single episode, moderate: Secondary | ICD-10-CM | POA: Diagnosis not present

## 2019-09-24 ENCOUNTER — Ambulatory Visit (INDEPENDENT_AMBULATORY_CARE_PROVIDER_SITE_OTHER): Payer: Medicaid Other | Admitting: Family Medicine

## 2019-09-24 ENCOUNTER — Ambulatory Visit (HOSPITAL_COMMUNITY)
Admission: RE | Admit: 2019-09-24 | Discharge: 2019-09-24 | Disposition: A | Discharge status: Home / Self Care | Payer: Medicaid Other | Source: Ambulatory Visit | Attending: Family Medicine | Admitting: Family Medicine

## 2019-09-24 ENCOUNTER — Other Ambulatory Visit: Payer: Self-pay

## 2019-09-24 ENCOUNTER — Encounter: Payer: Self-pay | Admitting: Family Medicine

## 2019-09-24 VITALS — BP 110/64 | HR 103 | Wt 171.8 lb

## 2019-09-24 DIAGNOSIS — R002 Palpitations: Secondary | ICD-10-CM | POA: Diagnosis not present

## 2019-09-25 LAB — CBC
Hematocrit: 38.6 % (ref 34.0–46.6)
Hemoglobin: 12.3 g/dL (ref 11.1–15.9)
MCH: 26.5 pg — ABNORMAL LOW (ref 26.6–33.0)
MCHC: 31.9 g/dL (ref 31.5–35.7)
MCV: 83 fL (ref 79–97)
Platelets: 354 10*3/uL (ref 150–450)
RBC: 4.65 x10E6/uL (ref 3.77–5.28)
RDW: 12.6 % (ref 11.7–15.4)
WBC: 9.7 10*3/uL (ref 3.4–10.8)

## 2019-09-25 LAB — COMPREHENSIVE METABOLIC PANEL
ALT: 10 IU/L (ref 0–32)
AST: 10 IU/L (ref 0–40)
Albumin/Globulin Ratio: 1.7 (ref 1.2–2.2)
Albumin: 4.3 g/dL (ref 3.9–5.0)
Alkaline Phosphatase: 98 IU/L (ref 39–117)
BUN/Creatinine Ratio: 11 (ref 9–23)
BUN: 9 mg/dL (ref 6–20)
Bilirubin Total: <0.2 mg/dL (ref 0.0–1.2)
CO2: 22 mmol/L (ref 20–29)
Calcium: 9.2 mg/dL (ref 8.7–10.2)
Chloride: 105 mmol/L (ref 96–106)
Creatinine, Ser: 0.82 mg/dL (ref 0.57–1.00)
GFR calc Af Amer: 111 mL/min/{1.73_m2} (ref >59)
GFR calc non Af Amer: 96 mL/min/{1.73_m2} (ref >59)
Globulin, Total: 2.6 g/dL (ref 1.5–4.5)
Glucose: 83 mg/dL (ref 65–99)
Potassium: 4 mmol/L (ref 3.5–5.2)
Sodium: 143 mmol/L (ref 134–144)
Total Protein: 6.9 g/dL (ref 6.0–8.5)

## 2019-09-25 LAB — TSH: TSH: 0.869 u[IU]/mL (ref 0.450–4.500)

## 2019-09-26 ENCOUNTER — Encounter: Payer: Self-pay | Admitting: Family Medicine

## 2019-09-26 ENCOUNTER — Ambulatory Visit (HOSPITAL_COMMUNITY): Admission: RE | Admit: 2019-09-26 | Payer: Medicaid Other | Source: Ambulatory Visit

## 2019-09-26 DIAGNOSIS — R002 Palpitations: Secondary | ICD-10-CM | POA: Insufficient documentation

## 2019-09-26 MED ORDER — CITALOPRAM HYDROBROMIDE 10 MG PO TABS: 10.0000 mg | ORAL_TABLET | Freq: Every day | ORAL | 1 refills | Status: DC

## 2019-09-26 NOTE — Assessment & Plan Note (Signed)
Most likely diagnosis at this point is uncontrolled anxiety, we will restart citalopram and patient agrees with continuing with therapy which she is always found helpful  Out of concern for cardiac etiology, more for family history than for HPI, CMP and CBC and TSH were all normal, ECG did not show any pathology, echo is pending at this time.  ED precautions were discussed

## 2019-09-26 NOTE — Progress Notes (Signed)
    SUBJECTIVE:   CHIEF COMPLAINT / HPI:   Patient comes in out of concern for "panic attacks ", she says that these have been happening a few times per week over the last few months.  She describes a personal history of anxiety attacks diagnosed and treated by therapy since her mother passed away approximately 3 years ago.  She was additionally started on citalopram after her last child was born 1 year ago at which point she felt she was well controlled.  In November she stopped her citalopram because she thought she was doing good, since then she has had an increase in these events which is concerning to her.  Further complicating her concerns is the fact that she has a cardiac death in her family, with her mother dying in her young 5s.  She also has a cousin she believes was in her 19s or 17s who passed away unexpectedly.  She denies any chest pain during these events, she denies that they are attached to activity, she does also say that they are not necessarily attached any particular emotional moment.  Sometimes is when she is driving on the road sometimes she sitting on her couch.  She says that they will go for 3 to 5 minutes before they will calm down.  She says she will feel claustrophobic and if she is in the car will have to close her window.  She will feel short of breath or that she needs to lay down.  She says she can tell that her heart is racing during these times but does not pass out  PERTINENT  PMH / PSH: See HPI  OBJECTIVE:   BP 110/64   Pulse (!) 103   Wt 171 lb 12.8 oz (77.9 kg)   SpO2 98%   BMI 28.59 kg/m   General: In no distress during our visit, pleasant and discussing her care appropriately Cardiac: Charted is tachycardic but was regular rate and rhythm to my exam, no murmur auscultated, ECG was reviewed and did not show any dysrhythmia, hypertrophy or ST changes.  No pitting edema Respiratory: No increased work of breathing, no cough, clear to auscultation  bilaterally   ASSESSMENT/PLAN:   Palpitations Most likely diagnosis at this point is uncontrolled anxiety, we will restart citalopram and patient agrees with continuing with therapy which she is always found helpful  Out of concern for cardiac etiology, more for family history than for HPI, CMP and CBC and TSH were all normal, ECG did not show any pathology, echo is pending at this time.  ED precautions were discussed     Sherene Sires, [place]

## 2019-10-06 DIAGNOSIS — F321 Major depressive disorder, single episode, moderate: Secondary | ICD-10-CM | POA: Diagnosis not present

## 2019-10-20 DIAGNOSIS — F321 Major depressive disorder, single episode, moderate: Secondary | ICD-10-CM | POA: Diagnosis not present

## 2019-11-03 DIAGNOSIS — F321 Major depressive disorder, single episode, moderate: Secondary | ICD-10-CM | POA: Diagnosis not present

## 2019-11-17 DIAGNOSIS — F321 Major depressive disorder, single episode, moderate: Secondary | ICD-10-CM | POA: Diagnosis not present

## 2019-12-01 DIAGNOSIS — F321 Major depressive disorder, single episode, moderate: Secondary | ICD-10-CM | POA: Diagnosis not present

## 2019-12-29 DIAGNOSIS — F321 Major depressive disorder, single episode, moderate: Secondary | ICD-10-CM | POA: Diagnosis not present

## 2020-01-12 DIAGNOSIS — F321 Major depressive disorder, single episode, moderate: Secondary | ICD-10-CM | POA: Diagnosis not present

## 2020-02-02 DIAGNOSIS — F411 Generalized anxiety disorder: Secondary | ICD-10-CM | POA: Diagnosis not present

## 2020-02-18 ENCOUNTER — Ambulatory Visit (INDEPENDENT_AMBULATORY_CARE_PROVIDER_SITE_OTHER): Payer: Medicaid Other | Admitting: Family Medicine

## 2020-02-18 ENCOUNTER — Other Ambulatory Visit (HOSPITAL_COMMUNITY)
Admission: RE | Admit: 2020-02-18 | Discharge: 2020-02-18 | Disposition: A | Discharge status: Home / Self Care | Payer: Medicaid Other | Source: Ambulatory Visit | Attending: Family Medicine | Admitting: Family Medicine

## 2020-02-18 ENCOUNTER — Other Ambulatory Visit: Payer: Self-pay

## 2020-02-18 ENCOUNTER — Encounter: Payer: Self-pay | Admitting: Family Medicine

## 2020-02-18 VITALS — BP 104/62 | HR 90 | Ht 65.0 in | Wt 173.8 lb

## 2020-02-18 DIAGNOSIS — G8929 Other chronic pain: Secondary | ICD-10-CM | POA: Diagnosis not present

## 2020-02-18 DIAGNOSIS — M542 Cervicalgia: Secondary | ICD-10-CM | POA: Diagnosis not present

## 2020-02-18 DIAGNOSIS — Z113 Encounter for screening for infections with a predominantly sexual mode of transmission: Secondary | ICD-10-CM | POA: Insufficient documentation

## 2020-02-18 DIAGNOSIS — Z124 Encounter for screening for malignant neoplasm of cervix: Secondary | ICD-10-CM

## 2020-02-18 DIAGNOSIS — F331 Major depressive disorder, recurrent, moderate: Secondary | ICD-10-CM | POA: Diagnosis not present

## 2020-02-18 LAB — POCT WET PREP (WET MOUNT)
Clue Cells Wet Prep Whiff POC: NEGATIVE
Trichomonas Wet Prep HPF POC: ABSENT

## 2020-02-18 NOTE — Patient Instructions (Addendum)
It was nice to see you again today,  I will call you with the results of your lab tests when I get them.  If there is anything positive we will discuss treating with antibiotics as necessary.  I will put in referral for evaluation of breast reduction based on your symptoms of back pain.  If somewhat is not call you within 1 to 2 weeks regarding this referral and making an appointment, you should call our office again to make sure the referral went through.    Have a great day,  Clemetine Marker, MD

## 2020-02-18 NOTE — Progress Notes (Signed)
    SUBJECTIVE:   CHIEF COMPLAINT / HPI:   Pap: Patient desires to have STD testing in addition to her Pap smear.  Does not have any recent new partners.  Denies discharge or vaginal burning or itching.  Depression: Patient is continuing to go to counseling at [facility] care services and taking her Celexa daily.  Does not currently feel depressed.  Back pain: Patient complains of upper back pain that she believes is related to her breast size.  She would like a referral for a breast reduction.  She states that her bra size is 38 double D.  Pain is between the shoulder blades and in the cervical region.   PERTINENT  PMH / PSH: Depression  OBJECTIVE:   BP (!) 104/62   Pulse 90   Ht 5\' 5"  (1.651 m)   Wt 173 lb 12.8 oz (78.8 kg)   SpO2 98%   BMI 28.92 kg/m   General: Alert and oriented.  No distress. GU: Brownish discharge seen in the vagina and coming from cervix.  Exam otherwise normal. MSK: TTP over left trapezius and between shoulder blades. Psych: Pleasant affect, good eye contact, spontaneous speech.  ASSESSMENT/PLAN:   Depression Patient responding well to therapy and Celexa.  We will continue current treatment for now.  Screen for STD (sexually transmitted disease) We will get wet prep and GC chlamydia test and follow-up with the patient.  Asymptomatic, but discharge appeared brown.  Could be related to menses as patient was recently on her cycle.  Cervical cancer screening Pap performed today.  We will follow up with results and notify patient when we get them.  Neck pain, chronic Patient has chronic cervical and thoracic pain consistent that is thought to be secondary to strain from supporting her breasts.  Patient states her sister has had breast reduction surgery in the past.  Patient would like to consider this option as well.  Would like referral to someone who can perform the surgery.  We will send in referral to plastic surgery.     Benay Pike, MD [place]

## 2020-02-20 LAB — CERVICOVAGINAL ANCILLARY ONLY
Chlamydia: NEGATIVE
Comment: NEGATIVE
Comment: NORMAL
Neisseria Gonorrhea: NEGATIVE

## 2020-02-22 DIAGNOSIS — Z124 Encounter for screening for malignant neoplasm of cervix: Secondary | ICD-10-CM | POA: Insufficient documentation

## 2020-02-22 DIAGNOSIS — Z113 Encounter for screening for infections with a predominantly sexual mode of transmission: Secondary | ICD-10-CM | POA: Insufficient documentation

## 2020-02-22 NOTE — Assessment & Plan Note (Signed)
Patient has chronic cervical and thoracic pain consistent that is thought to be secondary to strain from supporting her breasts.  Patient states her sister has had breast reduction surgery in the past.  Patient would like to consider this option as well.  Would like referral to someone who can perform the surgery.  We will send in referral to plastic surgery.

## 2020-02-22 NOTE — Assessment & Plan Note (Signed)
We will get wet prep and GC chlamydia test and follow-up with the patient.  Asymptomatic, but discharge appeared brown.  Could be related to menses as patient was recently on her cycle.

## 2020-02-22 NOTE — Assessment & Plan Note (Signed)
Pap performed today.  We will follow up with results and notify patient when we get them.

## 2020-02-22 NOTE — Assessment & Plan Note (Signed)
Patient responding well to therapy and Celexa.  We will continue current treatment for now.

## 2020-02-24 ENCOUNTER — Telehealth: Payer: Self-pay | Admitting: Family Medicine

## 2020-02-24 LAB — CYTOLOGY - PAP
Comment: NEGATIVE
Diagnosis: NEGATIVE
High risk HPV: NEGATIVE

## 2020-02-24 NOTE — Telephone Encounter (Signed)
Can we call pt to let her know the wet prep and STI testing came back normal but we are still waiting on the results of her pap smear.

## 2020-02-24 NOTE — Telephone Encounter (Signed)
Pt informed. Kathryn Fox, CMA  

## 2020-02-25 ENCOUNTER — Telehealth: Payer: Self-pay | Admitting: Family Medicine

## 2020-02-25 NOTE — Telephone Encounter (Signed)
Can we call this pt to let her know her pap, wet prep, and STI testing were all normal.

## 2020-02-27 NOTE — Telephone Encounter (Signed)
LVM with results. (DPR signed authorizing may leave detailed message on cell phone voicemail.) Ottis Stain, CMA

## 2020-03-01 DIAGNOSIS — F411 Generalized anxiety disorder: Secondary | ICD-10-CM | POA: Diagnosis not present

## 2020-03-25 DIAGNOSIS — M549 Dorsalgia, unspecified: Secondary | ICD-10-CM | POA: Diagnosis not present

## 2020-03-25 DIAGNOSIS — G8929 Other chronic pain: Secondary | ICD-10-CM | POA: Diagnosis not present

## 2020-03-25 DIAGNOSIS — N62 Hypertrophy of breast: Secondary | ICD-10-CM | POA: Diagnosis not present

## 2020-03-25 DIAGNOSIS — M542 Cervicalgia: Secondary | ICD-10-CM | POA: Diagnosis not present

## 2020-04-12 DIAGNOSIS — F411 Generalized anxiety disorder: Secondary | ICD-10-CM | POA: Diagnosis not present

## 2020-04-26 DIAGNOSIS — F411 Generalized anxiety disorder: Secondary | ICD-10-CM | POA: Diagnosis not present

## 2020-04-29 DIAGNOSIS — Z1231 Encounter for screening mammogram for malignant neoplasm of breast: Secondary | ICD-10-CM | POA: Diagnosis not present

## 2020-05-06 DIAGNOSIS — G8929 Other chronic pain: Secondary | ICD-10-CM | POA: Diagnosis not present

## 2020-05-06 DIAGNOSIS — N62 Hypertrophy of breast: Secondary | ICD-10-CM | POA: Diagnosis not present

## 2020-05-06 DIAGNOSIS — M542 Cervicalgia: Secondary | ICD-10-CM | POA: Diagnosis not present

## 2020-05-06 DIAGNOSIS — M549 Dorsalgia, unspecified: Secondary | ICD-10-CM | POA: Diagnosis not present

## 2020-05-06 NOTE — H&P (Signed)
Subjective:     Patient ID: Khadeejah Castner Polhamus is a 29 y.o. female.  HPI  Here for follow up discussion prior to planned breast reduction. Current 38 DD. Complains onset neck back pain and associated headaches as teenager. Reports she will experience neck spasms that prevent her from moving. She has tried specialty fitted bras, hot/cold packs, chiropractor visits, muscle relaxant, and OTC pain medication. Has some relief from OTC pain medication but she feels this interacts with her other medications and increases her anxiety. Denies rashes, denies numbness of hands.   Wt stable within 10 lb of current.  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 Reports right breast excision benign mass age 30.   Prior US for above. Notes one MA with breast ca in her 51s.  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 Lives with step dad, who is currently in rehab, and 5 and 46 yo kids. States done having kids. Sister lives close by to assist with post op care.  PMH includes depression, receives counseling at [facility] care services and reports as controlled.     Objective:   Physical Exam Cardiovascular:     Rate and Rhythm: Normal rate.  Pulmonary:     Effort: Pulmonary effort is normal.  Abdominal:     Palpations: Abdomen is soft.  Skin:    Comments: Fitzpatrick 6  Neurological:     Mental Status: She is oriented to person, place, and time.    bilateral breast tattoos  +shoulder grooving No masses grade 3 ptosis bilateral Left>right volume SN to nipple R  32 L 34 cm BW R 19 L 20 cm Nipple to IMF R 13.5 L 15 cm     Assessment:     Macromastia Chronic neck and back pain    Plan:     Congratulated her on stopping smoking.  At her age and one post menopausal family member with breast ca, do not require MMG prior to surgery. If patient desires prior to surgery we can arrange.  Chronic neck and back pain, HA in setting  macromastia that has failed conservative measures. Breast reduction has expectation to relieve symptoms.   Reviewed reduction with anchor type scars, OP surgery, drains, post operative visits and limitations, recovery. Diminished sensation nipple and breast skin, risk of nipple loss, wound healing problems, asymmetry, incidental carcinoma, changes with wt gain/loss, aging, unacceptable cosmetic appearance reviewed. Reviewed changes with pregnancy, effect on ability to breast feed.  Additional risks including but not limited to bleeding, hematoma, seroma, need for additional procedures, damage to adjacent structures, blood clots in legs or lungs reviewed.  Patient has been fully COVID19 vaccinated. Discussed risk COVID infectionthrough this elective surgery. Patient will receive COVID testing prior to surgery. Discussed even if patient receivesa negative test result, the tests in some cases may fail to detect the virus or patient maycontract COVID after the test.COVID 19 infectionbefore/during/aftersurgery may result in lead to a higher chance of complication and death.  Drain teaching completed. Rx for Norco given.  Letter for work provided.  Anticipate 547 g reduction from each breast

## 2020-05-07 ENCOUNTER — Encounter (HOSPITAL_BASED_OUTPATIENT_CLINIC_OR_DEPARTMENT_OTHER): Payer: Self-pay | Admitting: Plastic Surgery

## 2020-05-10 ENCOUNTER — Other Ambulatory Visit: Payer: Self-pay

## 2020-05-10 ENCOUNTER — Encounter (HOSPITAL_BASED_OUTPATIENT_CLINIC_OR_DEPARTMENT_OTHER): Payer: Self-pay | Admitting: Plastic Surgery

## 2020-05-10 NOTE — Progress Notes (Signed)
Pt seen by pcp for c/o panic attacks in March of 2021. EKG NSR, scheduled  for Echo- pt was no show. Reviewed with Dr Rex Kras.-not needed prior to scheduled surgery on 05/07/20. Pre op call completed, pt denies any issues and is now taking Celexa.

## 2020-05-11 ENCOUNTER — Other Ambulatory Visit (HOSPITAL_COMMUNITY): Payer: Medicaid Other

## 2020-05-12 ENCOUNTER — Other Ambulatory Visit (HOSPITAL_COMMUNITY)
Admission: RE | Admit: 2020-05-12 | Discharge: 2020-05-12 | Disposition: A | Discharge status: Home / Self Care | Payer: Medicaid Other | Source: Ambulatory Visit | Attending: Plastic Surgery | Admitting: Plastic Surgery

## 2020-05-12 DIAGNOSIS — Z01818 Encounter for other preprocedural examination: Secondary | ICD-10-CM | POA: Diagnosis not present

## 2020-05-12 DIAGNOSIS — Z20822 Contact with and (suspected) exposure to covid-19: Secondary | ICD-10-CM | POA: Insufficient documentation

## 2020-05-12 LAB — SARS CORONAVIRUS 2 (TAT 6-24 HRS): SARS Coronavirus 2: NEGATIVE

## 2020-05-12 MED ORDER — CHLORHEXIDINE GLUCONATE CLOTH 2 % EX PADS: 6.0000 | MEDICATED_PAD | Freq: Once | CUTANEOUS | Status: DC

## 2020-05-12 NOTE — Progress Notes (Signed)

## 2020-05-14 ENCOUNTER — Ambulatory Visit (HOSPITAL_BASED_OUTPATIENT_CLINIC_OR_DEPARTMENT_OTHER)
Admission: RE | Admit: 2020-05-14 | Discharge: 2020-05-14 | Disposition: A | Discharge status: Home / Self Care | Payer: Medicaid Other | Attending: Plastic Surgery | Admitting: Plastic Surgery

## 2020-05-14 ENCOUNTER — Encounter (HOSPITAL_BASED_OUTPATIENT_CLINIC_OR_DEPARTMENT_OTHER)
Admission: RE | Disposition: A | Discharge status: Home / Self Care | Payer: Self-pay | Source: Home / Self Care | Attending: Plastic Surgery

## 2020-05-14 ENCOUNTER — Ambulatory Visit (HOSPITAL_BASED_OUTPATIENT_CLINIC_OR_DEPARTMENT_OTHER): Payer: Medicaid Other | Admitting: Certified Registered Nurse Anesthetist

## 2020-05-14 ENCOUNTER — Other Ambulatory Visit: Payer: Self-pay

## 2020-05-14 ENCOUNTER — Encounter (HOSPITAL_BASED_OUTPATIENT_CLINIC_OR_DEPARTMENT_OTHER): Payer: Self-pay | Admitting: Plastic Surgery

## 2020-05-14 DIAGNOSIS — D509 Iron deficiency anemia, unspecified: Secondary | ICD-10-CM | POA: Diagnosis not present

## 2020-05-14 DIAGNOSIS — Z803 Family history of malignant neoplasm of breast: Secondary | ICD-10-CM | POA: Insufficient documentation

## 2020-05-14 DIAGNOSIS — G43909 Migraine, unspecified, not intractable, without status migrainosus: Secondary | ICD-10-CM | POA: Diagnosis not present

## 2020-05-14 DIAGNOSIS — K219 Gastro-esophageal reflux disease without esophagitis: Secondary | ICD-10-CM | POA: Diagnosis not present

## 2020-05-14 DIAGNOSIS — Z87891 Personal history of nicotine dependence: Secondary | ICD-10-CM | POA: Insufficient documentation

## 2020-05-14 DIAGNOSIS — N62 Hypertrophy of breast: Secondary | ICD-10-CM | POA: Diagnosis not present

## 2020-05-14 DIAGNOSIS — M542 Cervicalgia: Secondary | ICD-10-CM | POA: Diagnosis not present

## 2020-05-14 DIAGNOSIS — M549 Dorsalgia, unspecified: Secondary | ICD-10-CM | POA: Diagnosis not present

## 2020-05-14 HISTORY — PX: BREAST REDUCTION SURGERY: SHX8

## 2020-05-14 LAB — POCT PREGNANCY, URINE: Preg Test, Ur: NEGATIVE

## 2020-05-14 SURGERY — MAMMOPLASTY, REDUCTION
Anesthesia: General | Site: Breast | Laterality: Bilateral

## 2020-05-14 MED ORDER — PROPOFOL 10 MG/ML IV BOLUS
INTRAVENOUS | Status: AC
  Filled 2020-05-14: qty 40

## 2020-05-14 MED ORDER — ONDANSETRON HCL 4 MG/2ML IJ SOLN
INTRAMUSCULAR | Status: AC
  Filled 2020-05-14: qty 2

## 2020-05-14 MED ORDER — ACETAMINOPHEN 500 MG PO TABS
ORAL_TABLET | ORAL | Status: AC
  Filled 2020-05-14: qty 2

## 2020-05-14 MED ORDER — CLINDAMYCIN PHOSPHATE 900 MG/50ML IV SOLN
INTRAVENOUS | Status: AC
  Filled 2020-05-14: qty 50

## 2020-05-14 MED ORDER — MIDAZOLAM HCL 5 MG/5ML IJ SOLN
INTRAMUSCULAR | Status: DC | PRN
  Administered 2020-05-14: 2 mg via INTRAVENOUS

## 2020-05-14 MED ORDER — HYDROMORPHONE HCL 1 MG/ML IJ SOLN
INTRAMUSCULAR | Status: AC
Start: 2020-05-14 — End: ?
  Filled 2020-05-14: qty 0.5

## 2020-05-14 MED ORDER — ACETAMINOPHEN 500 MG PO TABS
1000.0000 mg | ORAL_TABLET | ORAL | Status: AC
  Administered 2020-05-14: 1000 mg via ORAL

## 2020-05-14 MED ORDER — GLYCOPYRROLATE PF 0.2 MG/ML IJ SOSY
PREFILLED_SYRINGE | INTRAMUSCULAR | Status: AC
  Filled 2020-05-14: qty 1

## 2020-05-14 MED ORDER — GABAPENTIN 300 MG PO CAPS
ORAL_CAPSULE | ORAL | Status: AC
  Filled 2020-05-14: qty 1

## 2020-05-14 MED ORDER — PHENYLEPHRINE 40 MCG/ML (10ML) SYRINGE FOR IV PUSH (FOR BLOOD PRESSURE SUPPORT)
PREFILLED_SYRINGE | INTRAVENOUS | Status: AC
  Filled 2020-05-14: qty 10

## 2020-05-14 MED ORDER — CELECOXIB 200 MG PO CAPS
ORAL_CAPSULE | ORAL | Status: AC
  Filled 2020-05-14: qty 1

## 2020-05-14 MED ORDER — CELECOXIB 200 MG PO CAPS
200.0000 mg | ORAL_CAPSULE | ORAL | Status: AC
  Administered 2020-05-14: 200 mg via ORAL

## 2020-05-14 MED ORDER — DEXMEDETOMIDINE HCL 200 MCG/2ML IV SOLN
INTRAVENOUS | Status: DC | PRN
  Administered 2020-05-14 (×2): 8 ug via INTRAVENOUS

## 2020-05-14 MED ORDER — FENTANYL CITRATE (PF) 100 MCG/2ML IJ SOLN
INTRAMUSCULAR | Status: AC
  Filled 2020-05-14: qty 2

## 2020-05-14 MED ORDER — GLYCOPYRROLATE 0.2 MG/ML IJ SOLN
INTRAMUSCULAR | Status: DC | PRN
  Administered 2020-05-14: .2 mg via INTRAVENOUS

## 2020-05-14 MED ORDER — BUPIVACAINE HCL 0.5 % IJ SOLN
INTRAMUSCULAR | Status: DC | PRN
  Administered 2020-05-14: 30 mL

## 2020-05-14 MED ORDER — PHENYLEPHRINE 40 MCG/ML (10ML) SYRINGE FOR IV PUSH (FOR BLOOD PRESSURE SUPPORT)
PREFILLED_SYRINGE | INTRAVENOUS | Status: DC | PRN
  Administered 2020-05-14: 40 ug via INTRAVENOUS
  Administered 2020-05-14 (×3): 80 ug via INTRAVENOUS
  Administered 2020-05-14: 40 ug via INTRAVENOUS
  Administered 2020-05-14 (×6): 80 ug via INTRAVENOUS

## 2020-05-14 MED ORDER — DEXAMETHASONE SODIUM PHOSPHATE 10 MG/ML IJ SOLN
INTRAMUSCULAR | Status: DC | PRN
  Administered 2020-05-14: 10 mg via INTRAVENOUS

## 2020-05-14 MED ORDER — HYDROMORPHONE HCL 1 MG/ML IJ SOLN
INTRAMUSCULAR | Status: AC
  Filled 2020-05-14: qty 0.5

## 2020-05-14 MED ORDER — LIDOCAINE 2% (20 MG/ML) 5 ML SYRINGE
INTRAMUSCULAR | Status: AC
  Filled 2020-05-14: qty 5

## 2020-05-14 MED ORDER — MIDAZOLAM HCL 2 MG/2ML IJ SOLN
INTRAMUSCULAR | Status: AC
  Filled 2020-05-14: qty 2

## 2020-05-14 MED ORDER — GABAPENTIN 300 MG PO CAPS
300.0000 mg | ORAL_CAPSULE | ORAL | Status: AC
  Administered 2020-05-14: 300 mg via ORAL

## 2020-05-14 MED ORDER — OXYCODONE HCL 5 MG/5ML PO SOLN: 5.0000 mg | Freq: Once | ORAL | Status: DC | PRN

## 2020-05-14 MED ORDER — ONDANSETRON HCL 4 MG/2ML IJ SOLN
INTRAMUSCULAR | Status: DC | PRN
  Administered 2020-05-14: 4 mg via INTRAVENOUS

## 2020-05-14 MED ORDER — BUPIVACAINE HCL (PF) 0.25 % IJ SOLN
INTRAMUSCULAR | Status: AC
  Filled 2020-05-14: qty 30

## 2020-05-14 MED ORDER — FENTANYL CITRATE (PF) 100 MCG/2ML IJ SOLN
INTRAMUSCULAR | Status: DC | PRN
  Administered 2020-05-14: 100 ug via INTRAVENOUS
  Administered 2020-05-14: 50 ug via INTRAVENOUS
  Administered 2020-05-14: 100 ug via INTRAVENOUS
  Administered 2020-05-14: 50 ug via INTRAVENOUS

## 2020-05-14 MED ORDER — BUPIVACAINE HCL (PF) 0.5 % IJ SOLN
INTRAMUSCULAR | Status: AC
  Filled 2020-05-14: qty 30

## 2020-05-14 MED ORDER — LIDOCAINE 2% (20 MG/ML) 5 ML SYRINGE
INTRAMUSCULAR | Status: DC | PRN
  Administered 2020-05-14: 60 mg via INTRAVENOUS

## 2020-05-14 MED ORDER — ROCURONIUM BROMIDE 10 MG/ML (PF) SYRINGE
PREFILLED_SYRINGE | INTRAVENOUS | Status: DC | PRN
  Administered 2020-05-14: 20 mg via INTRAVENOUS
  Administered 2020-05-14: 10 mg via INTRAVENOUS
  Administered 2020-05-14: 60 mg via INTRAVENOUS
  Administered 2020-05-14: 10 mg via INTRAVENOUS

## 2020-05-14 MED ORDER — LIDOCAINE-EPINEPHRINE 1 %-1:100000 IJ SOLN
INTRAMUSCULAR | Status: AC
  Filled 2020-05-14: qty 1

## 2020-05-14 MED ORDER — CLINDAMYCIN PHOSPHATE 900 MG/50ML IV SOLN
900.0000 mg | INTRAVENOUS | Status: AC
  Administered 2020-05-14: 900 mg via INTRAVENOUS

## 2020-05-14 MED ORDER — EPHEDRINE 5 MG/ML INJ
INTRAVENOUS | Status: AC
  Filled 2020-05-14: qty 10

## 2020-05-14 MED ORDER — SUGAMMADEX SODIUM 200 MG/2ML IV SOLN
INTRAVENOUS | Status: DC | PRN
  Administered 2020-05-14: 200 mg via INTRAVENOUS

## 2020-05-14 MED ORDER — ONDANSETRON HCL 4 MG/2ML IJ SOLN
4.0000 mg | Freq: Once | INTRAMUSCULAR | Status: AC | PRN
  Administered 2020-05-14: 4 mg via INTRAVENOUS

## 2020-05-14 MED ORDER — EPHEDRINE SULFATE 50 MG/ML IJ SOLN
INTRAMUSCULAR | Status: DC | PRN
  Administered 2020-05-14 (×4): 5 mg via INTRAVENOUS

## 2020-05-14 MED ORDER — ROCURONIUM BROMIDE 10 MG/ML (PF) SYRINGE
PREFILLED_SYRINGE | INTRAVENOUS | Status: AC
  Filled 2020-05-14: qty 10

## 2020-05-14 MED ORDER — LACTATED RINGERS IV SOLN: INTRAVENOUS | Status: DC

## 2020-05-14 MED ORDER — HYDROMORPHONE HCL 1 MG/ML IJ SOLN
0.2500 mg | INTRAMUSCULAR | Status: DC | PRN
  Administered 2020-05-14 (×3): 0.5 mg via INTRAVENOUS

## 2020-05-14 MED ORDER — DEXAMETHASONE SODIUM PHOSPHATE 10 MG/ML IJ SOLN
INTRAMUSCULAR | Status: AC
  Filled 2020-05-14: qty 1

## 2020-05-14 MED ORDER — PROPOFOL 10 MG/ML IV BOLUS
INTRAVENOUS | Status: DC | PRN
  Administered 2020-05-14: 180 mg via INTRAVENOUS

## 2020-05-14 MED ORDER — OXYCODONE HCL 5 MG PO TABS: 5.0000 mg | ORAL_TABLET | Freq: Once | ORAL | Status: DC | PRN

## 2020-05-14 SURGICAL SUPPLY — 61 items
ADH SKN CLS APL DERMABOND .7 (GAUZE/BANDAGES/DRESSINGS) ×2
APL PRP STRL LF DISP 70% ISPRP (MISCELLANEOUS) ×2
APPLIER CLIP 9.375 MED OPEN (MISCELLANEOUS)
APR CLP MED 9.3 20 MLT OPN (MISCELLANEOUS)
BINDER BREAST 3XL (GAUZE/BANDAGES/DRESSINGS) IMPLANT
BINDER BREAST LRG (GAUZE/BANDAGES/DRESSINGS) IMPLANT
BINDER BREAST MEDIUM (GAUZE/BANDAGES/DRESSINGS) IMPLANT
BINDER BREAST XLRG (GAUZE/BANDAGES/DRESSINGS) ×3 IMPLANT
BINDER BREAST XXLRG (GAUZE/BANDAGES/DRESSINGS) IMPLANT
BLADE SURG 10 STRL SS (BLADE) ×12 IMPLANT
BNDG GAUZE ELAST 4 BULKY (GAUZE/BANDAGES/DRESSINGS) ×6 IMPLANT
CANISTER SUCT 1200ML W/VALVE (MISCELLANEOUS) ×3 IMPLANT
CHLORAPREP W/TINT 26 (MISCELLANEOUS) ×6 IMPLANT
CLIP APPLIE 9.375 MED OPEN (MISCELLANEOUS) IMPLANT
CLIP VESOCCLUDE MED 6/CT (CLIP) IMPLANT
COVER BACK TABLE 60X90IN (DRAPES) ×3 IMPLANT
COVER MAYO STAND STRL (DRAPES) ×3 IMPLANT
COVER WAND RF STERILE (DRAPES) IMPLANT
DERMABOND ADVANCED (GAUZE/BANDAGES/DRESSINGS) ×4
DERMABOND ADVANCED .7 DNX12 (GAUZE/BANDAGES/DRESSINGS) ×2 IMPLANT
DRAIN CHANNEL 15F RND FF W/TCR (WOUND CARE) ×6 IMPLANT
DRAPE TOP ARMCOVERS (MISCELLANEOUS) ×3 IMPLANT
DRAPE U-SHAPE 76X120 STRL (DRAPES) ×3 IMPLANT
DRAPE UTILITY XL STRL (DRAPES) ×3 IMPLANT
DRSG PAD ABDOMINAL 8X10 ST (GAUZE/BANDAGES/DRESSINGS) ×6 IMPLANT
ELECT COATED BLADE 2.86 ST (ELECTRODE) ×3 IMPLANT
ELECT REM PT RETURN 9FT ADLT (ELECTROSURGICAL) ×3
ELECTRODE REM PT RTRN 9FT ADLT (ELECTROSURGICAL) ×1 IMPLANT
EVACUATOR SILICONE 100CC (DRAIN) ×6 IMPLANT
GLOVE BIO SURGEON STRL SZ 6 (GLOVE) ×6 IMPLANT
GLOVE BIOGEL PI IND STRL 6.5 (GLOVE) ×1 IMPLANT
GLOVE BIOGEL PI INDICATOR 6.5 (GLOVE) ×2
GLOVE ECLIPSE 6.5 STRL STRAW (GLOVE) ×12 IMPLANT
GLOVE SURG SS PI 6.5 STRL IVOR (GLOVE) ×3 IMPLANT
GOWN STRL REUS W/ TWL LRG LVL3 (GOWN DISPOSABLE) ×2 IMPLANT
GOWN STRL REUS W/TWL LRG LVL3 (GOWN DISPOSABLE) ×6
MARKER SKIN DUAL TIP RULER LAB (MISCELLANEOUS) ×3 IMPLANT
NEEDLE HYPO 25X1 1.5 SAFETY (NEEDLE) ×3 IMPLANT
NS IRRIG 1000ML POUR BTL (IV SOLUTION) ×3 IMPLANT
PACK BASIN DAY SURGERY FS (CUSTOM PROCEDURE TRAY) ×3 IMPLANT
PENCIL SMOKE EVACUATOR (MISCELLANEOUS) ×3 IMPLANT
PIN SAFETY STERILE (MISCELLANEOUS) IMPLANT
SHEET MEDIUM DRAPE 40X70 STRL (DRAPES) ×6 IMPLANT
SLEEVE SCD COMPRESS KNEE MED (MISCELLANEOUS) ×3 IMPLANT
SPONGE LAP 18X18 RF (DISPOSABLE) ×12 IMPLANT
STAPLER VISISTAT 35W (STAPLE) ×6 IMPLANT
SUT ETHILON 2 0 FS 18 (SUTURE) ×3 IMPLANT
SUT MNCRL AB 4-0 PS2 18 (SUTURE) ×12 IMPLANT
SUT PDS AB 2-0 CT2 27 (SUTURE) ×3 IMPLANT
SUT VIC AB 3-0 PS1 18 (SUTURE) ×21
SUT VIC AB 3-0 PS1 18XBRD (SUTURE) ×7 IMPLANT
SUT VICRYL 4-0 PS2 18IN ABS (SUTURE) ×6 IMPLANT
SYR BULB IRRIG 60ML STRL (SYRINGE) ×3 IMPLANT
SYR CONTROL 10ML LL (SYRINGE) ×3 IMPLANT
TAPE MEASURE VINYL STERILE (MISCELLANEOUS) IMPLANT
TOWEL GREEN STERILE FF (TOWEL DISPOSABLE) ×6 IMPLANT
TRAY FOLEY W/BAG SLVR 14FR LF (SET/KITS/TRAYS/PACK) IMPLANT
TUBE CONNECTING 20'X1/4 (TUBING) ×1
TUBE CONNECTING 20X1/4 (TUBING) ×2 IMPLANT
UNDERPAD 30X36 HEAVY ABSORB (UNDERPADS AND DIAPERS) ×6 IMPLANT
YANKAUER SUCT BULB TIP NO VENT (SUCTIONS) ×3 IMPLANT

## 2020-05-14 NOTE — Progress Notes (Signed)
Pt became diaphoretic and nauseous while up to bathroom. Pivoted to wheelchair and back to recliner. VSS. Given cool washcloth, ice pack, fan, and additional zofran for nausea.

## 2020-05-14 NOTE — Op Note (Signed)
Operative Note   DATE OF OPERATION: 10.22.21  LOCATION: [facility]-outpatient  SURGICAL DIVISION: Plastic Surgery  PREOPERATIVE DIAGNOSES:  1. Macromastia 2. Chronic neck and back pain  POSTOPERATIVE DIAGNOSES:  same  PROCEDURE:  Bilateral breast reduction  SURGEON: Irene Limbo MD MBA  ASSISTANT: none  ANESTHESIA:  General.   EBL: 588 ml  COMPLICATIONS: None immediate.   INDICATIONS FOR PROCEDURE:  The patient, Kathryn Fox, is a 29 y.o. female born on [birth year], is here for treatment chronic neck and back pain in setting of macromastia that has failed conservative measures.   FINDINGS: Right reduction 408 g Left 601 g  DESCRIPTION OF PROCEDURE:  The patient was marked standing in the preoperative area to mark sternal notch, chest midline, anterior axillary lines, inframammary folds. The location of new nipple areolar complex was marked at level of on inframammary fold on anterior surface breast by palpation. This was marked symmetric over bilateral breasts. With aid of Wise pattern marker, location of new nipple areolar complex and vertical limbs (6.5cm) were markedby displacement of breasts along meridian. The patient was taken to the operating room. SCDs were placedand IV antibiotics were given. The patient's operative site was prepped and draped in a sterile fashion. A time out was performed and all information was confirmed to be correct.   Over left breast, superomedial pedicle marked and nipple areolar complexincisedwith47mm diameter marker. Pedicle deepithlialized and developedto chest wall. Breast tissue resected over lower pole. Medial and lateral flaps developed.Additionalsuperior pole andlateral chest wall tissue excised.Breast tailor tacked closed.  I then directed attention to right breast where superomedial pedicle designed.NAC marked with25mm diameter marker.The pedicle was deepithelialized. Pedicle developed until tension free  rotation was possible.Breast tissue resected over lower pole. Medial and lateral flaps developed.Additionalsuperior pole andlateral chest wall tissue excised. Breast tailor tacked closed, and patient brought to upright sitting position and assessed for symmetry. Patent returned to supine position. Breast cavities irrigated and hemostasis obtained.Local anestheticinfiltrated throughout each breast. 15 Fr JP placed in each breast and secured with 2-0 nylon. Closure completed bilateralwith 3-0 vicryl to approximate dermis along inframammary fold and vertical limb. NAC inset with 4-0 vicryl in dermis. Skin closure completed with 4-0 monocryl subcuticular throughout. Tissue adhesive applied. Dry dressing and breast binder applied.  The patient was allowed to wake from anesthesia, extubated and taken to the recovery room in satisfactory condition.   SPECIMENS: right and left breast reduction  DRAINS: 15 Fr JP in right and left breast

## 2020-05-14 NOTE — Anesthesia Preprocedure Evaluation (Signed)
Anesthesia Evaluation  Patient identified by MRN, date of birth, ID band Patient awake    Reviewed: Patient's Chart, lab work & pertinent test results  Airway Mallampati: II  TM Distance: >3 FB Neck ROM: Full    Dental  (+) Teeth Intact   Pulmonary former smoker,    Pulmonary exam normal        Cardiovascular negative cardio ROS   Rhythm:Regular Rate:Normal     Neuro/Psych  Headaches, Depression    GI/Hepatic Neg liver ROS, GERD  Controlled,  Endo/Other  negative endocrine ROS  Renal/GU negative Renal ROS   LGSIL, HSV    Musculoskeletal Back/neck pain 2/2 breasts   Abdominal (+)  Abdomen: soft. Bowel sounds: normal.  Peds negative pediatric ROS (+)  Hematology  (+) anemia ,   Anesthesia Other Findings   Reproductive/Obstetrics                             Anesthesia Physical Anesthesia Plan  ASA: II  Anesthesia Plan: General   Post-op Pain Management:    Induction: Intravenous  PONV Risk Score and Plan: 3 and Ondansetron, Dexamethasone, Midazolam and Treatment may vary due to age or medical condition  Airway Management Planned: Mask and Oral ETT  Additional Equipment: None  Intra-op Plan:   Post-operative Plan: Extubation in OR  Informed Consent: I have reviewed the patients History and Physical, chart, labs and discussed the procedure including the risks, benefits and alternatives for the proposed anesthesia with the patient or authorized representative who has indicated his/her understanding and acceptance.     Dental advisory given  Plan Discussed with: CRNA  Anesthesia Plan Comments: (Lab Results      Component                Value               Date                      PREGTESTUR               NEGATIVE            05/14/2020                HCG                      <5.0                10/21/2016          )        Anesthesia Quick Evaluation

## 2020-05-14 NOTE — Discharge Instructions (Signed)
Next dose of Tylenol can be given at 1:00pm if needed. Next dose of NSAIDS (Motrin/Ibuprofen/Aleve) can be given at 3:00pm if needed.   Post Anesthesia Home Care Instructions  Activity: Get plenty of rest for the remainder of the day. A responsible individual must stay with you for 24 hours following the procedure.  For the next 24 hours, DO NOT: -Drive a car -Paediatric nurse -Drink alcoholic beverages -Take any medication unless instructed by your physician -Make any legal decisions or sign important papers.  Meals: Start with liquid foods such as gelatin or soup. Progress to regular foods as tolerated. Avoid greasy, spicy, heavy foods. If nausea and/or vomiting occur, drink only clear liquids until the nausea and/or vomiting subsides. Call your physician if vomiting continues.  Special Instructions/Symptoms: Your throat may feel dry or sore from the anesthesia or the breathing tube placed in your throat during surgery. If this causes discomfort, gargle with warm salt water. The discomfort should disappear within 24 hours.     About my Jackson-Pratt Bulb Drain  What is a Jackson-Pratt bulb? A Jackson-Pratt is a soft, round device used to collect drainage. It is connected to a long, thin drainage catheter, which is held in place by one or two small stiches near your surgical incision site. When the bulb is squeezed, it forms a vacuum, forcing the drainage to empty into the bulb.  Emptying the Jackson-Pratt bulb- To empty the bulb: 1. Release the plug on the top of the bulb. 2. Pour the bulb's contents into a measuring container which your nurse will provide. 3. Record the time emptied and amount of drainage. Empty the drain(s) as often as your     doctor or nurse recommends.  Date                  Time                    Amount (Drain 1)                 Amount (Drain  2)  _____________________________________________________________________  _____________________________________________________________________  _____________________________________________________________________  _____________________________________________________________________  _____________________________________________________________________  _____________________________________________________________________  _____________________________________________________________________  _____________________________________________________________________  Squeezing the Jackson-Pratt Bulb- To squeeze the bulb: 1. Make sure the plug at the top of the bulb is open. 2. Squeeze the bulb tightly in your fist. You will hear air squeezing from the bulb. 3. Replace the plug while the bulb is squeezed. 4. Use a safety pin to attach the bulb to your clothing. This will keep the catheter from     pulling at the bulb insertion site.  When to call your doctor- Call your doctor if:  Drain site becomes red, swollen or hot.  You have a fever greater than 101 degrees F.  There is oozing at the drain site.  Drain falls out (apply a guaze bandage over the drain hole and secure it with tape).  Drainage increases daily not related to activity patterns. (You will usually have more drainage when you are active than when you are resting.)  Drainage has a bad odor.

## 2020-05-14 NOTE — Transfer of Care (Signed)
Immediate Anesthesia Transfer of Care Note  Patient: Kathryn Fox  Procedure(s) Performed: MAMMARY REDUCTION  (BREAST) (Bilateral Breast)  Patient Location: PACU  Anesthesia Type:General  Level of Consciousness: awake, alert , oriented and patient cooperative  Airway & Oxygen Therapy: Patient Spontanous Breathing and Patient connected to nasal cannula oxygen  Post-op Assessment: Report given to RN, Post -op Vital signs reviewed and stable and Patient moving all extremities  Post vital signs: Reviewed and stable  Last Vitals:  Vitals Value Taken Time  BP 121/61 05/14/20 1116  Temp    Pulse 80 05/14/20 1118  Resp 12 05/14/20 1118  SpO2 100 % 05/14/20 1118  Vitals shown include unvalidated device data.  Last Pain:  Vitals:   05/14/20 0644  TempSrc: Oral  PainSc: 0-No pain         Complications: No complications documented.

## 2020-05-14 NOTE — Interval H&P Note (Signed)
History and Physical Interval Note:  05/14/2020 6:54 AM  Kathryn Fox  has presented today for surgery, with the diagnosis of macromastia, chronic neck and back pain.  The various methods of treatment have been discussed with the patient and family. After consideration of risks, benefits and other options for treatment, the patient has consented to  Procedure(s): MAMMARY REDUCTION  (BREAST) (Bilateral) as a surgical intervention.  The patient's history has been reviewed, patient examined, no change in status, stable for surgery.  I have reviewed the patient's chart and labs.  Questions were answered to the patient's satisfaction.     Arnoldo Hooker Arieana Somoza

## 2020-05-14 NOTE — Anesthesia Postprocedure Evaluation (Signed)
Anesthesia Post Note  Patient: Kathryn Fox  Procedure(s) Performed: MAMMARY REDUCTION  (BREAST) (Bilateral Breast)     Patient location during evaluation: PACU Anesthesia Type: General Level of consciousness: awake and alert Pain management: pain level controlled Vital Signs Assessment: post-procedure vital signs reviewed and stable Respiratory status: spontaneous breathing, nonlabored ventilation, respiratory function stable and patient connected to nasal cannula oxygen Cardiovascular status: blood pressure returned to baseline and stable Postop Assessment: no apparent nausea or vomiting Anesthetic complications: no   No complications documented.  Last Vitals:  Vitals:   05/14/20 1315 05/14/20 1330  BP: 114/77 116/71  Pulse: 100 62  Resp:    Temp:    SpO2: 100% 100%    Last Pain:  Vitals:   05/14/20 1235  TempSrc:   PainSc: 2                  March Rummage Robi Mitter

## 2020-05-14 NOTE — Progress Notes (Signed)
Called by PACU staff for concern right breast firmer than left. On exam, similar appearance to intra operative closure and at end procedure. No evidence gross hematoma.

## 2020-05-14 NOTE — Anesthesia Procedure Notes (Signed)
Procedure Name: Intubation Date/Time: 05/14/2020 7:23 AM Performed by: Rogers Blocker, CRNA Pre-anesthesia Checklist: Patient identified, Emergency Drugs available, Suction available and Patient being monitored Patient Re-evaluated:Patient Re-evaluated prior to induction Oxygen Delivery Method: Circle System Utilized Preoxygenation: Pre-oxygenation with 100% oxygen Induction Type: IV induction Ventilation: Mask ventilation without difficulty Laryngoscope Size: Mac and 3 Grade View: Grade I Tube type: Oral Number of attempts: 1 Airway Equipment and Method: Stylet Placement Confirmation: ETT inserted through vocal cords under direct vision,  positive ETCO2 and breath sounds checked- equal and bilateral Secured at: 22 cm Tube secured with: Tape Dental Injury: Teeth and Oropharynx as per pre-operative assessment

## 2020-05-17 ENCOUNTER — Encounter (HOSPITAL_BASED_OUTPATIENT_CLINIC_OR_DEPARTMENT_OTHER): Payer: Self-pay | Admitting: Plastic Surgery

## 2020-05-17 LAB — SURGICAL PATHOLOGY

## 2020-05-24 DIAGNOSIS — F411 Generalized anxiety disorder: Secondary | ICD-10-CM | POA: Diagnosis not present

## 2020-06-15 ENCOUNTER — Encounter (HOSPITAL_COMMUNITY): Payer: Self-pay | Admitting: Emergency Medicine

## 2020-06-15 ENCOUNTER — Other Ambulatory Visit: Payer: Self-pay

## 2020-06-15 ENCOUNTER — Ambulatory Visit (HOSPITAL_COMMUNITY)
Admission: EM | Admit: 2020-06-15 | Discharge: 2020-06-15 | Disposition: A | Discharge status: Home / Self Care | Payer: Medicaid Other | Attending: Family Medicine | Admitting: Family Medicine

## 2020-06-15 DIAGNOSIS — N939 Abnormal uterine and vaginal bleeding, unspecified: Secondary | ICD-10-CM | POA: Diagnosis not present

## 2020-06-15 LAB — POC URINE PREG, ED: Preg Test, Ur: NEGATIVE

## 2020-06-15 MED ORDER — NAPROXEN 375 MG PO TABS: 375.0000 mg | ORAL_TABLET | Freq: Two times a day (BID) | ORAL | 0 refills | Status: DC

## 2020-06-15 NOTE — Discharge Instructions (Signed)
Take medicine for pain and bleeding Call your OB/GYN for follow up

## 2020-06-15 NOTE — ED Provider Notes (Signed)
[facility]    CSN: [id number] Arrival date & time: 06/15/20  1508      History   Chief Complaint Chief Complaint  Patient presents with  . Back Pain  . Vaginal Bleeding  . Nausea    HPI Kathryn Fox is a 29 y.o. female.   HPI  Patient has IUD for birth control.  She states she has had some irregular menstrual bleeding.  Menstrual cramps.  No vaginal discharge.  No fever chills.  No concern for sexually transmitted infection.  No dysuria or frequency.  Headache or body aches.  She called her OB/GYN.  The OB/GYN doctor recommends that she has a pregnancy test to check for ectopic.  She is bring her son in today for hand-foot-and-mouth disease.  She would like to her pregnancy test while she is here.  Past Medical History:  Diagnosis Date  . Anemia affecting second pregnancy 07/25/2018  . Depression    hx of, no current problems on zoloft  . GERD (gastroesophageal reflux disease)   . Headache   . HSV (herpes simplex virus) anogenital infection   . 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 Colposcopy in April, 2015 clinically normal. Recommend repeat her test Pap in one year.   . Vaginal Pap smear, abnormal    bx, f/u ok since    Patient Active Problem List   Diagnosis Date Noted  . Screen for STD (sexually transmitted disease) 02/22/2020  . Cervical cancer screening 02/22/2020  . Palpitations 09/26/2019  . Cough 05/09/2019  . Wheezing 05/09/2019  . Indication for care in labor or delivery 08/22/2018  . Vacuum-assisted vaginal delivery 08/22/2018  . Anemia affecting second pregnancy 07/25/2018  . Severe anemia 07/25/2018  . Iron deficiency anemia 07/18/2018  . Anemia in pregnancy, third trimester 07/18/2018  . Bilateral occipital neuralgia 04/11/2018  . Pregnant 01/23/2018  . Ptyalism 01/09/2018  . Nausea and vomiting during pregnancy 01/09/2018  . GERD (gastroesophageal reflux  disease) 01/09/2018  . Urinary tract infection affecting pregnancy 01/09/2018  . Tobacco abuse 04/25/2017  . Shortness of breath 01/02/2017  . 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 Depression 05/29/2013  . 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 Neck pain, chronic 02/25/2011  . Migraine headache 09/22/2010    Past Surgical History:  Procedure Laterality Date  . BREAST FIBROADENOMA SURGERY    . BREAST REDUCTION SURGERY Bilateral 05/14/2020   Procedure: MAMMARY REDUCTION  (BREAST);  Surgeon: Irene Limbo, MD;  Location: [facility];  Service: Plastics;  Laterality: Bilateral;    OB History    Gravida  2   Para  2   Term  2   Preterm      AB      Living  2     SAB      TAB      Ectopic      Multiple  0   Live Births  2            Home Medications    Prior to Admission medications   Medication Sig Start Date End Date Taking? Authorizing Provider  Levonorgestrel (MIRENA, 52 MG, IU) by Intrauterine route.   Yes [provider]  valACYclovir (VALTREX) 500 MG tablet Take 500 mg by mouth daily. 08/15/18  Yes [provider]  citalopram (CELEXA) 10 MG tablet Take  1 tablet (10 mg total) by mouth daily. 09/26/19 05/10/20  Sherene Sires, DO  naproxen (NAPROSYN) 375 MG tablet Take 1 tablet (375 mg total) by mouth 2 (two) times daily. 06/15/20   Raylene Everts, MD    Family History Family History  Problem Relation Age of Onset  . Cancer Maternal Aunt        Breast  . Diabetes Maternal Aunt   . Cancer Maternal Grandfather        lung  . Hypertension Mother   . Hyperlipidemia Mother   . Diabetes Mother   . Congestive Heart Failure Mother   . Healthy Father     Social History Social History   Tobacco Use  . Smoking status: Former Smoker    Packs/day: 0.00    Types: Cigars    Quit date: 05/24/2014    Years since  quitting: 6.0  . Smokeless tobacco: Never Used  . Tobacco comment: also smoked hookah  Vaping Use  . Vaping Use: Never used  Substance Use Topics  . Alcohol use: No  . Drug use: No     Allergies   Mushroom extract complex, Peanut-containing drug products, Penicillins, and Septra [sulfamethoxazole-trimethoprim]   Review of Systems Review of Systems See HPI  Physical Exam Triage Vital Signs ED Triage Vitals  Enc Vitals Group     BP 06/15/20 1543 128/88     Pulse Rate 06/15/20 1543 92     Resp --      Temp 06/15/20 1543 98.8 F (37.1 C)     Temp Source 06/15/20 1543 Oral     SpO2 06/15/20 1543 100 %     Weight --      Height --      Head Circumference --      Peak Flow --      Pain Score 06/15/20 1540 4     Pain Loc --      Pain Edu? --      Excl. in [place]? --    No data found.  Updated Vital Signs BP 128/88 (BP Location: Left Arm)   Pulse 92   Temp 98.8 F (37.1 C) (Oral)   SpO2 100%      Physical Exam Constitutional:      General: She is not in acute distress.    Appearance: She is well-developed.     Comments: Pleasant.  Overweight.  No acute distress  HENT:     Head: Normocephalic and atraumatic.     Mouth/Throat:     Comments: Mask is in place Eyes:     Conjunctiva/sclera: Conjunctivae normal.     Pupils: Pupils are equal, round, and reactive to light.  Cardiovascular:     Rate and Rhythm: Normal rate.  Pulmonary:     Effort: Pulmonary effort is normal. No respiratory distress.  Abdominal:     General: There is no distension.     Palpations: Abdomen is soft.     Tenderness: There is no abdominal tenderness.  Musculoskeletal:        General: Normal range of motion.     Cervical back: Normal range of motion.  Skin:    General: Skin is warm and dry.  Neurological:     Mental Status: She is alert.  Psychiatric:        Behavior: Behavior normal.      UC Treatments / Results  Labs (all labs ordered are listed, but only abnormal results are  displayed) Labs Reviewed  POC URINE  PREG, ED    EKG   Radiology No results found.  Procedures Procedures (including critical care time)  Medications Ordered in UC Medications - No data to display  Initial Impression / Assessment and Plan / UC Course  I have reviewed the triage vital signs and the nursing notes.  Pertinent labs & imaging results that were available during my care of the patient were reviewed by me and considered in my medical decision making (see chart for details).     Pregnancy test is negative.  Will give naproxen for cramps and bleeding.  Follow-up with OB Final Clinical Impressions(s) / UC Diagnoses   Final diagnoses:  Vaginal bleeding     Discharge Instructions     Take medicine for pain and bleeding Call your OB/GYN for follow up   ED Prescriptions    Medication Sig Dispense Auth. Provider   naproxen (NAPROSYN) 375 MG tablet Take 1 tablet (375 mg total) by mouth 2 (two) times daily. 20 tablet Raylene Everts, MD     PDMP not reviewed this encounter.   Raylene Everts, MD 06/15/20 [phone]

## 2020-06-15 NOTE — ED Triage Notes (Signed)
Pt c/o lower back pain, abd cramping and vaginal bleeding onset the 14th. Pt states that she called her OBGYN and was told she needed to check for ectopic pregnancy. Pt states she has not taking a pregnancy test because she has an IUD.

## 2020-06-21 DIAGNOSIS — F411 Generalized anxiety disorder: Secondary | ICD-10-CM | POA: Diagnosis not present

## 2020-07-05 DIAGNOSIS — F411 Generalized anxiety disorder: Secondary | ICD-10-CM | POA: Diagnosis not present

## 2020-07-29 DIAGNOSIS — Z20828 Contact with and (suspected) exposure to other viral communicable diseases: Secondary | ICD-10-CM | POA: Diagnosis not present

## 2020-07-29 DIAGNOSIS — U071 COVID-19: Secondary | ICD-10-CM | POA: Diagnosis not present

## 2020-08-02 DIAGNOSIS — F411 Generalized anxiety disorder: Secondary | ICD-10-CM | POA: Diagnosis not present

## 2020-08-21 DIAGNOSIS — F411 Generalized anxiety disorder: Secondary | ICD-10-CM | POA: Diagnosis not present

## 2020-09-02 DIAGNOSIS — Z30431 Encounter for routine checking of intrauterine contraceptive device: Secondary | ICD-10-CM | POA: Diagnosis not present

## 2020-09-02 DIAGNOSIS — Z3A29 29 weeks gestation of pregnancy: Secondary | ICD-10-CM | POA: Diagnosis not present

## 2020-09-02 DIAGNOSIS — Z113 Encounter for screening for infections with a predominantly sexual mode of transmission: Secondary | ICD-10-CM | POA: Diagnosis not present

## 2020-09-02 DIAGNOSIS — N926 Irregular menstruation, unspecified: Secondary | ICD-10-CM | POA: Diagnosis not present

## 2020-09-02 DIAGNOSIS — K219 Gastro-esophageal reflux disease without esophagitis: Secondary | ICD-10-CM | POA: Diagnosis not present

## 2020-09-02 DIAGNOSIS — B373 Candidiasis of vulva and vagina: Secondary | ICD-10-CM | POA: Diagnosis not present

## 2020-09-02 DIAGNOSIS — N898 Other specified noninflammatory disorders of vagina: Secondary | ICD-10-CM | POA: Diagnosis not present

## 2020-09-03 DIAGNOSIS — F411 Generalized anxiety disorder: Secondary | ICD-10-CM | POA: Diagnosis not present

## 2020-09-07 ENCOUNTER — Other Ambulatory Visit: Payer: Self-pay

## 2020-09-07 ENCOUNTER — Encounter (HOSPITAL_COMMUNITY): Payer: Self-pay | Admitting: Emergency Medicine

## 2020-09-07 ENCOUNTER — Ambulatory Visit (HOSPITAL_COMMUNITY)
Admission: EM | Admit: 2020-09-07 | Discharge: 2020-09-07 | Disposition: A | Discharge status: Home / Self Care | Payer: Medicaid Other | Attending: Medical Oncology | Admitting: Medical Oncology

## 2020-09-07 DIAGNOSIS — R197 Diarrhea, unspecified: Secondary | ICD-10-CM

## 2020-09-07 DIAGNOSIS — R112 Nausea with vomiting, unspecified: Secondary | ICD-10-CM

## 2020-09-07 MED ORDER — ONDANSETRON 4 MG PO TBDP: 4.0000 mg | ORAL_TABLET | Freq: Three times a day (TID) | ORAL | 0 refills | Status: DC | PRN

## 2020-09-07 NOTE — ED Triage Notes (Signed)
Pt presents with N, V, D, and fatigue that started last night.

## 2020-09-07 NOTE — ED Provider Notes (Signed)
[facility]    CSN: [id number] Arrival date & time: 09/07/20  1042      History   Chief Complaint Chief Complaint  Patient presents with  . Emesis  . Nausea  . Diarrhea  . Fatigue    HPI Kathryn Fox is a 30 y.o. female.   HPI   Vomiting/Diarrhea: Pt reports that last night she started having nausea with associated vomiting, diarrhea and fatigue. She reports 8 episodes of vomiting and 20 episodes of diarrhea. She denies bloody vomiting or diarrhea. No abdominal pain, fevers, constipation, urinary symptoms. She has used Pepto with some improvement as she has been able to hold down liquids for the past few hours. Of note her son had a similar sickness this week that lasted one day and self resolved. Of note she has an IUD.   Past Medical History:  Diagnosis Date  . Anemia affecting second pregnancy 07/25/2018  . Depression    hx of, no current problems on zoloft  . GERD (gastroesophageal reflux disease)   . Headache   . HSV (herpes simplex virus) anogenital infection   . 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 Colposcopy in April, 2015 clinically normal. Recommend repeat her test Pap in one year.   . Vaginal Pap smear, abnormal    bx, f/u ok since    Patient Active Problem List   Diagnosis Date Noted  . Screen for STD (sexually transmitted disease) 02/22/2020  . Cervical cancer screening 02/22/2020  . Palpitations 09/26/2019  . Cough 05/09/2019  . Wheezing 05/09/2019  . Indication for care in labor or delivery 08/22/2018  . Vacuum-assisted vaginal delivery 08/22/2018  . Anemia affecting second pregnancy 07/25/2018  . Severe anemia 07/25/2018  . Iron deficiency anemia 07/18/2018  . Anemia in pregnancy, third trimester 07/18/2018  . Bilateral occipital neuralgia 04/11/2018  . Pregnant 01/23/2018  . Ptyalism 01/09/2018  . Nausea and vomiting during pregnancy 01/09/2018  . GERD  (gastroesophageal reflux disease) 01/09/2018  . Urinary tract infection affecting pregnancy 01/09/2018  . Tobacco abuse 04/25/2017  . Shortness of breath 01/02/2017  . 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 Depression 05/29/2013  . 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 Neck pain, chronic 02/25/2011  . Migraine headache 09/22/2010    Past Surgical History:  Procedure Laterality Date  . BREAST FIBROADENOMA SURGERY    . BREAST REDUCTION SURGERY Bilateral 05/14/2020   Procedure: MAMMARY REDUCTION  (BREAST);  Surgeon: Irene Limbo, MD;  Location: [facility];  Service: Plastics;  Laterality: Bilateral;    OB History    Gravida  2   Para  2   Term  2   Preterm      AB      Living  2     SAB      IAB      Ectopic      Multiple  0   Live Births  2            Home Medications    Prior to Admission medications   Medication Sig Start Date End Date Taking? Authorizing Provider  ondansetron (ZOFRAN ODT) 4 MG disintegrating tablet Take 1 tablet (4 mg total) by mouth every 8 (eight) hours as needed for nausea or vomiting. 09/07/20  Yes Benjy Kana M, PA-C  citalopram (CELEXA) 10 MG tablet Take  1 tablet (10 mg total) by mouth daily. 09/26/19 05/10/20  Sherene Sires, DO  Levonorgestrel (MIRENA, 52 MG, IU) by Intrauterine route.    [provider]  naproxen (NAPROSYN) 375 MG tablet Take 1 tablet (375 mg total) by mouth 2 (two) times daily. 06/15/20   Raylene Everts, MD  valACYclovir (VALTREX) 500 MG tablet Take 500 mg by mouth daily. 08/15/18   [provider]    Family History Family History  Problem Relation Age of Onset  . Cancer Maternal Aunt        Breast  . Diabetes Maternal Aunt   . Cancer Maternal Grandfather        lung  . Hypertension Mother   . Hyperlipidemia Mother   . Diabetes Mother   . Congestive Heart  Failure Mother   . Healthy Father     Social History Social History   Tobacco Use  . Smoking status: Former Smoker    Packs/day: 0.00    Types: Cigars    Quit date: 05/24/2014    Years since quitting: 6.2  . Smokeless tobacco: Never Used  . Tobacco comment: also smoked hookah  Vaping Use  . Vaping Use: Never used  Substance Use Topics  . Alcohol use: No  . Drug use: No     Allergies   Mushroom extract complex, Peanut-containing drug products, Penicillins, and Septra [sulfamethoxazole-trimethoprim]   Review of Systems Review of Systems  As stated above in HPI Physical Exam Triage Vital Signs ED Triage Vitals  Enc Vitals Group     BP 09/07/20 1118 121/73     Pulse Rate 09/07/20 1118 94     Resp 09/07/20 1118 17     Temp 09/07/20 1118 98.3 F (36.8 C)     Temp Source 09/07/20 1118 Oral     SpO2 09/07/20 1118 100 %     Weight --      Height --      Head Circumference --      Peak Flow --      Pain Score 09/07/20 1116 0     Pain Loc --      Pain Edu? --      Excl. in [place]? --    No data found.  Updated Vital Signs BP 121/73 (BP Location: Right Arm)   Pulse 94   Temp 98.3 F (36.8 C) (Oral)   Resp 17   SpO2 100%   Physical Exam Vitals and nursing note reviewed.  Constitutional:      General: She is not in acute distress.    Appearance: She is not ill-appearing, toxic-appearing or diaphoretic.  HENT:     Head: Normocephalic.     Mouth/Throat:     Mouth: Mucous membranes are moist.  Eyes:     Comments: Without pallor  Cardiovascular:     Rate and Rhythm: Normal rate and regular rhythm.     Heart sounds: Normal heart sounds.  Pulmonary:     Effort: Pulmonary effort is normal.     Breath sounds: Normal breath sounds.  Abdominal:     General: Abdomen is flat. Bowel sounds are normal. There is no distension.     Palpations: Abdomen is soft. There is no mass.     Tenderness: There is no abdominal tenderness. There is no right CVA tenderness, left CVA  tenderness, guarding or rebound.     Hernia: No hernia is present.  Musculoskeletal:     Cervical back: Neck supple.  Neurological:  Mental Status: She is alert.      UC Treatments / Results  Labs (all labs ordered are listed, but only abnormal results are displayed) Labs Reviewed - No data to display  EKG   Radiology No results found.  Procedures Procedures (including critical care time)  Medications Ordered in UC Medications - No data to display  Initial Impression / Assessment and Plan / UC Course  I have reviewed the triage vital signs and the nursing notes.  Pertinent labs & imaging results that were available during my care of the patient were reviewed by me and considered in my medical decision making (see chart for details).    New. Will treat with zofran, rest and fluids. Pedialyte and water. Discussed strict return to ER precautions and red flag signs and symptoms.  Final Clinical Impressions(s) / UC Diagnoses   Final diagnoses:  Intractable vomiting with nausea, unspecified vomiting type  Diarrhea, unspecified type   Discharge Instructions   None    ED Prescriptions    Medication Sig Dispense Auth. Provider   ondansetron (ZOFRAN ODT) 4 MG disintegrating tablet Take 1 tablet (4 mg total) by mouth every 8 (eight) hours as needed for nausea or vomiting. 20 tablet Hughie Closs, [place]     PDMP not reviewed this encounter.   Hughie Closs, [place] 09/07/20 1154

## 2020-09-17 DIAGNOSIS — R102 Pelvic and perineal pain: Secondary | ICD-10-CM | POA: Diagnosis not present

## 2020-10-14 DIAGNOSIS — F319 Bipolar disorder, unspecified: Secondary | ICD-10-CM | POA: Diagnosis not present

## 2020-10-18 DIAGNOSIS — J069 Acute upper respiratory infection, unspecified: Secondary | ICD-10-CM | POA: Diagnosis not present

## 2020-10-25 ENCOUNTER — Other Ambulatory Visit: Payer: Self-pay | Admitting: *Deleted

## 2020-10-26 ENCOUNTER — Telehealth: Payer: Self-pay | Admitting: *Deleted

## 2020-10-26 NOTE — Telephone Encounter (Signed)
Patient needs to make an appointment.  Last refill was a year ago for 180 tabs so we need to assess if this is a new episode and assess her compliance.

## 2020-10-26 NOTE — Telephone Encounter (Addendum)
LVM on phone to call office to give her message from PCP about her refill request for celexa. Also sent a MyCH[employer] message Please inform her of below and assist in getting an appointment scheduled.Verble Styron Tacy Dura Rumple, CMA    Benay Pike, MD  You 10 hours ago (12:14 AM)       Patient needs to make an appointment.  Last refill was a year ago for 180 tabs so we need to assess if this is a new episode and assess her compliance.        Documentation

## 2020-10-27 NOTE — Telephone Encounter (Signed)
Contacted pt and she is coming into [facility] on 4/12 to discuss this, PCP did not have anything until later in the month. Correna Meacham Zimmerman Rumple, CMA

## 2020-10-27 NOTE — Telephone Encounter (Signed)
Pt has appointment in [facility] on 4/12 to discuss this.Nikole Swartzentruber Zimmerman Rumple, CMA

## 2020-11-01 NOTE — Telephone Encounter (Signed)
Please refuse this medication so that I can close this encounter, pt has an upcoming appointment for this.Kathryn Fox, CMA

## 2020-11-02 ENCOUNTER — Other Ambulatory Visit: Payer: Self-pay

## 2020-11-02 ENCOUNTER — Ambulatory Visit (INDEPENDENT_AMBULATORY_CARE_PROVIDER_SITE_OTHER): Payer: Medicaid Other | Admitting: Student in an Organized Health Care Education/Training Program

## 2020-11-02 DIAGNOSIS — F331 Major depressive disorder, recurrent, moderate: Secondary | ICD-10-CM | POA: Diagnosis not present

## 2020-11-02 DIAGNOSIS — F319 Bipolar disorder, unspecified: Secondary | ICD-10-CM | POA: Diagnosis not present

## 2020-11-02 MED ORDER — CITALOPRAM HYDROBROMIDE 20 MG PO TABS: 20.0000 mg | ORAL_TABLET | Freq: Every day | ORAL | 0 refills | Status: DC

## 2020-11-02 NOTE — Progress Notes (Signed)
   SUBJECTIVE:   CHIEF COMPLAINT / HPI: medication refill, depression f/u  Depression- patient endorses feeling much better since last visit. Endorses seeing an improvement on celexa. In particular, she cites an increase in her energy and motivation. She is participating in counseling every 2 weeks and also sees benefit from this.  Counseling Feels a lot better. Endorses increased energy.  Wants to increase dose  OBJECTIVE:   BP 118/60   Pulse 88   Ht 5\' 5"  (1.651 m)   Wt 177 lb 12.8 oz (80.6 kg)   SpO2 99%   BMI 29.59 kg/m   Physical Exam Vitals and nursing note reviewed.  Constitutional:      General: She is not in acute distress.    Appearance: She is normal weight.  HENT:     Head: Normocephalic.     Mouth/Throat:     Mouth: Mucous membranes are moist.  Pulmonary:     Effort: Pulmonary effort is normal.  Skin:    General: Skin is dry.  Neurological:     General: No focal deficit present.     Mental Status: She is alert and oriented to person, place, and time.  Psychiatric:        Attention and Perception: Attention normal.        Mood and Affect: Mood is not depressed.        Speech: Speech normal.    ASSESSMENT/PLAN:   Depression Although PHQ-9 score has increased from 9 to 14 at this follow up, subjectively, patient endorses feeling much improved.  Denies SI.  Continue therapy Increasing celexa to 20mg . Tolerating 10mg  well.  Follow up with PCP in 6-8 weeks     [place]

## 2020-11-02 NOTE — Patient Instructions (Signed)
It was a pleasure to see you today!  To summarize our discussion for this visit:  I'm glad to hear that you are doing better with treatment and counseling. Today we are going to increase your dose of celexa to 20mg  daily.  Please follow up with your PCP in 6-8 weeks for follow up.  Some additional health maintenance measures we should update are: Health Maintenance Due  Topic Date Due  . COVID-19 Vaccine (3 - Booster for Moderna series) 07/30/2020  .    Call the clinic at [phone] if your symptoms worsen or you have any concerns.   Thank you for allowing me to take part in your care,  Dr. Doristine Mango

## 2020-11-02 NOTE — Assessment & Plan Note (Signed)
Although PHQ-9 score has increased from 9 to 14 at this follow up, subjectively, patient endorses feeling much improved.  Denies SI.  Continue therapy Increasing celexa to 20mg . Tolerating 10mg  well.  Follow up with PCP in 6-8 weeks

## 2020-11-08 ENCOUNTER — Telehealth: Payer: Self-pay | Admitting: Family Medicine

## 2020-11-08 NOTE — Telephone Encounter (Signed)
Ok to go back down.  She should schedule an appt with me now that I'm available again.

## 2020-11-08 NOTE — Telephone Encounter (Signed)
Patient stopped by has problem with her medicine she's taking it was increased for 10mg  to 20 mg Celex.  She states she's dizzy and depressed by the increased doze.  She wants to go back to 10mg  she uses [facility] on [address] any questions please call patient.

## 2020-11-08 NOTE — Telephone Encounter (Signed)
Pt informed of below and appointment scheduled.  Routing to PCP so he can resend the Rx for the 10 mg. Kayshawn Ozburn Zimmerman Rumple, CMA

## 2020-11-15 NOTE — Telephone Encounter (Signed)
Patient calls nurse line checking status of 10mg  citalopram to pharmacy. Patient advised she can cut the 20mg  in half. Patient stated she never thought about this and reports she looked at the tablets and halving should be no issue. Patient is coming in on 4/29 to discuss. Advised a new prescription can be sent in then.

## 2020-11-16 DIAGNOSIS — F319 Bipolar disorder, unspecified: Secondary | ICD-10-CM | POA: Diagnosis not present

## 2020-11-18 NOTE — Progress Notes (Signed)
    SUBJECTIVE:   CHIEF COMPLAINT / HPI:   Depression: decreased celexa back down to 10mg .  Has been cutting the medication  in half.  Felt like the 20mg  gave her a 'tingling sensation' and felt like bugs were crawling on her and felt paranoia like 'somebody was there but not really there'.  Having a difficult time sleeping and couldn't sleep for two days earler.  Patient states she was diagnosied with bipolar disorder when she was 50-30 yo in [place].  Can't remember how long she was on medication but stopped when she moved.   [facility]. Unsure if they prescribe medications there.   PERTINENT  PMH / PSH:   OBJECTIVE:   BP 118/62   Pulse (!) 103   Ht 5\' 5"  (1.651 m)   Wt 176 lb 9.6 oz (80.1 kg)   SpO2 96%   BMI 29.39 kg/m   Gen: alert, oriented. No acute distress.  CV: RRR Pulm: lctab Psych: pleasant affect. Normal speech. Normal thought content  ASSESSMENT/PLAN:   Depression Pt states she was previously diagnosed with bipolar disorder as a teenager.  Describing symptoms of possible hypomanic episode when celexa was increased to 20mg .  Symptoms better when decreased back to 10mg .  [facility].  Advised pt to inquire if they prescribe psychiatric medication there.  If not, provided pt with list of psychiatrists to contact.  Will keep pt on celexa 10mg  to avoid worsening depression until she can be seen by psychiatrist and treated for bpolar disorder if necessary.       Benay Pike, MD [place]

## 2020-11-19 ENCOUNTER — Ambulatory Visit (INDEPENDENT_AMBULATORY_CARE_PROVIDER_SITE_OTHER): Payer: Medicaid Other | Admitting: Family Medicine

## 2020-11-19 ENCOUNTER — Other Ambulatory Visit: Payer: Self-pay

## 2020-11-19 ENCOUNTER — Encounter: Payer: Self-pay | Admitting: Family Medicine

## 2020-11-19 DIAGNOSIS — F331 Major depressive disorder, recurrent, moderate: Secondary | ICD-10-CM | POA: Diagnosis present

## 2020-11-19 NOTE — Patient Instructions (Signed)
It was nice to see you today,  You can continue taking the Celexa 10 mg if you feel like it is helping.  If you start to notice a similar symptoms to what you were feeling when you took 20 mg you should stop the medication and let us know.  You should talk to your therapist regarding whether or not they manage medications at [facility] care services.  If not, you can call one of the numbers listed below.  I would try starting with family services of the [place].  They should be low or no cost to you.  Have a great day,  Clemetine Marker, MD  [place] Providers (No Insurance at time of Visit or Self Pay)  [place] ([place]) walk in M-F 8am-12pm and  1pm-3pm [place]  [place]  Phone: [phone]  [facility] Mon-Fri, 8:30-5:00  [address] [place], [place] [address]    [phone] [email or web address]  [phone] (Immediate assistance)  [facility]    Walk-in Mon-Fri, 8am-3pm [address] [place], [place]  [email or web address] (Mental Health and substance challenges) [address], [place] [phone]    [email or web address] [place], [place]     Phone:  [phone] [place]  [phone]         [facility] Walk-in MWF 12:30 to 3:00     [place] [address]  [phone]  [email or web address] call to schedule an appointment    [place] ,Support group, Peer support services, [address] [place], [place] [address] [phone]  [email or web address]           National Alliance on Mental Illness (NAMI) [place]- Wellness classes, Support groups        [address], [place], [place] [address] [phone]   CurrentJokes.cz  [facility]  (Psycho-social  Rehabilitation clubhouse, Individual and group therapy) [address],  [address]   [phone]  24- Hour Availability:  *[place] or [phone] * [facility] [employer] (Domestic Violence, Rape, etc. )[phone] [place] [phone] or [phone] * [place] [phone] only) [phone] (after hours) *[facility] Mobile Crisis Unit [phone] *[place] National Suicide Hotline (978)366-8631 Diamantina Monks)

## 2020-11-21 NOTE — Assessment & Plan Note (Signed)
Pt states she was previously diagnosed with bipolar disorder as a teenager.  Describing symptoms of possible hypomanic episode when celexa was increased to 20mg .  Symptoms better when decreased back to 10mg .  [facility].  Advised pt to inquire if they prescribe psychiatric medication there.  If not, provided pt with list of psychiatrists to contact.  Will keep pt on celexa 10mg  to avoid worsening depression until she can be seen by psychiatrist and treated for bpolar disorder if necessary.

## 2020-12-09 ENCOUNTER — Telehealth: Payer: Self-pay | Admitting: Family Medicine

## 2020-12-09 NOTE — Telephone Encounter (Signed)
..   Medicaid Managed Care   Unsuccessful Outreach Note  12/09/2020 Name: Kathryn Fox MRN: [id number] DOB: 07-14-1991  Referred by: Benay Pike, MD Reason for referral : High Risk Managed Medicaid (Attempted to reach [name] today to get her scheduled with the [facility] RNCM for a phone visit. I left a message with my name and number on her VM.)   An unsuccessful telephone outreach was attempted today. The patient was referred to the case management team for assistance with care management and care coordination.   Follow Up Plan: The care management team will reach out to the patient again over the next 7-14 days.   [place]

## 2020-12-14 DIAGNOSIS — F319 Bipolar disorder, unspecified: Secondary | ICD-10-CM | POA: Diagnosis not present

## 2020-12-23 ENCOUNTER — Telehealth: Payer: Self-pay | Admitting: Family Medicine

## 2020-12-23 NOTE — Telephone Encounter (Signed)
..  Patient declines further follow up and engagement by the Managed Medicaid Team. Appropriate care team members and provider have been notified via electronic communication. The Managed Medicaid Team is available to follow up with the patient after provider conversation with the patient regarding recommendation for engagement and subsequent re-referral to the Managed Medicaid Team.    Jennifer Alley Care Guide, High Risk Medicaid Managed Care Embedded Care Coordination [place]  [facility]   

## 2021-01-28 ENCOUNTER — Other Ambulatory Visit: Payer: Self-pay | Admitting: Student in an Organized Health Care Education/Training Program

## 2021-01-31 DIAGNOSIS — F319 Bipolar disorder, unspecified: Secondary | ICD-10-CM | POA: Diagnosis not present

## 2021-02-22 ENCOUNTER — Telehealth: Payer: Self-pay | Admitting: Family Medicine

## 2021-02-22 NOTE — Telephone Encounter (Signed)
   Kathryn Fox DOB: 1991/06/19 MRN: [id number]   RIDER WAIVER AND RELEASE OF LIABILITY  For purposes of improving physical access to our facilities, [facility] is pleased to partner with third parties to provide Bristow Fox or other authorized individuals the option of convenient, on-demand ground transportation services (the [place]") through use of the technology service that enables users to request on-demand ground transportation from independent third-party providers.  By opting to use and accept these [employer], I, the undersigned, hereby agree on behalf of myself, and on behalf of any Kathryn Fox using the Government social research officer for whom I am the parent or legal guardian, as follows:  Government social research officer provided to me are provided by independent third-party transportation providers who are not [employer] or employees and who are unaffiliated with [employer]. [place] is neither a transportation carrier nor a common or public carrier. [facility] has no control over the quality or safety of the transportation that occurs as a result of the [employer]. [place] cannot guarantee that any third-party transportation provider will complete any arranged transportation service. [facility] makes no representation, warranty, or guarantee regarding the reliability, timeliness, quality, safety, suitability, or availability of any of the Transport Services or that they will be error free. I fully understand that traveling by vehicle involves risks and dangers of serious bodily injury, including permanent disability, paralysis, and death. I agree, on behalf of myself and on behalf of any Kathryn Fox using the Transport Services for whom I am the parent or legal guardian, that the entire risk arising out of my use of the [employer] remains solely with me, to the maximum extent permitted under applicable law. The [employer] are provided "as  is" and "as available." [place] disclaims all representations and warranties, express, implied or statutory, not expressly set out in these terms, including the implied warranties of merchantability and fitness for a particular purpose. I hereby waive and release [place], its agents, employees, officers, directors, representatives, insurers, attorneys, assigns, successors, subsidiaries, and affiliates from any and all past, present, or future claims, demands, liabilities, actions, causes of action, or suits of any kind directly or indirectly arising from acceptance and use of the [employer]. I further waive and release [place] and its affiliates from all present and future liability and responsibility for any injury or death to persons or damages to property caused by or related to the use of the [employer]. I have read this Waiver and Release of Liability, and I understand the terms used in it and their legal significance. This Waiver is freely and voluntarily given with the understanding that my right (as well as the right of any Kathryn Fox for whom I am the parent or legal guardian using the [employer]) to legal recourse against [place] in connection with the [employer] is knowingly surrendered in return for use of these services.   I attest that I read the consent document to Kathryn Fox, gave Kathryn Fox the opportunity to ask questions and answered the questions asked (if any). I affirm that Kathryn Fox then provided consent for she's participation in this program.     Kathryn Fox

## 2021-03-09 DIAGNOSIS — F319 Bipolar disorder, unspecified: Secondary | ICD-10-CM | POA: Diagnosis not present

## 2021-03-23 DIAGNOSIS — F319 Bipolar disorder, unspecified: Secondary | ICD-10-CM | POA: Diagnosis not present

## 2021-04-13 DIAGNOSIS — F319 Bipolar disorder, unspecified: Secondary | ICD-10-CM | POA: Diagnosis not present

## 2021-04-27 ENCOUNTER — Other Ambulatory Visit: Payer: Self-pay

## 2021-04-27 ENCOUNTER — Ambulatory Visit (INDEPENDENT_AMBULATORY_CARE_PROVIDER_SITE_OTHER): Payer: Medicaid Other

## 2021-04-27 DIAGNOSIS — Z23 Encounter for immunization: Secondary | ICD-10-CM

## 2021-04-27 DIAGNOSIS — F319 Bipolar disorder, unspecified: Secondary | ICD-10-CM | POA: Diagnosis not present

## 2021-05-11 DIAGNOSIS — F319 Bipolar disorder, unspecified: Secondary | ICD-10-CM | POA: Diagnosis not present

## 2021-05-31 ENCOUNTER — Encounter: Payer: Self-pay | Admitting: Family Medicine

## 2021-05-31 NOTE — Progress Notes (Signed)
SUBJECTIVE:   CHIEF COMPLAINT / HPI:   Depression At last visit in April, she had decreased her citalopram from 20 mg to 10 mg due to "tingling sensation", "crawling sensation, difficulty sleeping, and paranoia after her citalopram had been increased to 20 mg.  Reported that she was diagnosed with bipolar as a teenager in [place], was on medication for stopped when she moved.  Goes to [facility] for therapy. Today, she states she reduced her citalopram on her own due to reported side effects of hematuria.  She has been taking this only as needed, once a week or less often.  She would like to switch to a different medication.  She was previously on sertraline which she states was not effective. She reports most prominent symptoms of depressed mood and decreased energy I went over DSM-V criteria for bipolar disorder.  She does report having periods of inflated self-esteem and distractibility but never for days at a time.  She denies any history of mania.  Denies SI though she sometimes does imagine being in a car accident when driving.  No prior psychiatric hospitalizations.  Denies history of seizure and eating disorder.  Fatigue Patient states she has been more fatigued for the past several months, since May or April. She is wondering if her iron levels are low, she states she has history of severe anemia requiring transfusions She has been taking an OTC potassium supplement to try to boost her energy levels  Contraception Patient wants to have her IUD removed.  She states it is causing her discomfort.  She states she has had an ultrasound done by her OB/GYN and IUD was placed correctly.  She wants to switch to the patch.  She does not want to have the Nexplanon.  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), HSV-2, mammary reduction  OBJECTIVE:   BP 106/62   Pulse 74   Wt 179 lb (81.2 kg)    SpO2 94%   BMI 29.79 kg/m   General: overweight female, NAD CV: RRR, no murmurs Pulm: CTAB, no wheezes or rales  Depression screen PHQ 2/9 06/01/2021  Decreased Interest 1  Down, Depressed, Hopeless 1  PHQ - 2 Score 2  Altered sleeping 3  Tired, decreased energy 3  Change in appetite 0  Feeling bad or failure about yourself  2  Trouble concentrating 1  Moving slowly or fidgety/restless 0  Suicidal thoughts 0  PHQ-9 Score 11  Difficult doing work/chores -     ASSESSMENT/PLAN:   IUD (intrauterine device) in place Patient requests removal.  Advised to schedule separate appointment for this.  She wants to switch to the patch; however, I strongly advised her to consider Nexplanon.  She is a current smoker so would be at increased risk for blood clots and discussed that estrogen patch would be contraindicated after age 64 if she continues to smoke.  Depression She has self weaned citalopram due to adverse reaction.  Low suspicion for underlying bipolar disorder.  I think bupropion would be a good alternative for her given fatigue and smoking status.  No contraindications to starting. No SI. - start buproprion 150 mg daily, can increase to BID after 3 days if tolerating - d/c citalopram - continue therapy - f/u 6 weeks   Fatigue Suspect underlying mood disorder is contributing.  We will check CBC and BMP given history of anemia  during pregnancy requiring IV iron transfusion and taking potassium supplement.  Advised to discontinue potassium supplement.  HCM - Covid bivalent booster discussed, she would like to wait until after the holidays.  Advised she can return for RN visit to get this done - last pap July 2021 normal with negative HPV, next due 2026  Zola Button, MD [place]

## 2021-05-31 NOTE — Patient Instructions (Addendum)
It was nice seeing you today!  Try buproprion for your mood. Take once a day in the morning for 3 days. If not having any side effects, increase to twice a day.  Schedule a separate visit for IUD removal. I strongly recommend Nexplanon as an alternative. The patch contains estrogen which can increase your risk of blood clots since you also smoke.  Bedsider.org has great information on birth control.  Blood work today.  Follow-up in 6 weeks.  Please arrive at least 15 minutes prior to your scheduled appointments.  Stay well, Zola Button, MD [place] [phone]

## 2021-06-01 ENCOUNTER — Ambulatory Visit (INDEPENDENT_AMBULATORY_CARE_PROVIDER_SITE_OTHER): Payer: Medicaid Other | Admitting: Family Medicine

## 2021-06-01 ENCOUNTER — Encounter: Payer: Self-pay | Admitting: Family Medicine

## 2021-06-01 ENCOUNTER — Other Ambulatory Visit: Payer: Self-pay

## 2021-06-01 VITALS — BP 106/62 | HR 74 | Wt 179.0 lb

## 2021-06-01 DIAGNOSIS — F331 Major depressive disorder, recurrent, moderate: Secondary | ICD-10-CM | POA: Diagnosis present

## 2021-06-01 DIAGNOSIS — Z975 Presence of (intrauterine) contraceptive device: Secondary | ICD-10-CM | POA: Diagnosis not present

## 2021-06-01 DIAGNOSIS — R5383 Other fatigue: Secondary | ICD-10-CM | POA: Diagnosis not present

## 2021-06-01 DIAGNOSIS — Z3009 Encounter for other general counseling and advice on contraception: Secondary | ICD-10-CM | POA: Diagnosis not present

## 2021-06-01 MED ORDER — BUPROPION HCL ER (SR) 150 MG PO TB12: ORAL_TABLET | ORAL | 2 refills | Status: DC

## 2021-06-02 ENCOUNTER — Ambulatory Visit: Payer: Medicaid Other

## 2021-06-02 ENCOUNTER — Encounter: Payer: Self-pay | Admitting: Family Medicine

## 2021-06-02 DIAGNOSIS — Z975 Presence of (intrauterine) contraceptive device: Secondary | ICD-10-CM | POA: Insufficient documentation

## 2021-06-02 LAB — BASIC METABOLIC PANEL
BUN/Creatinine Ratio: 10 (ref 9–23)
BUN: 8 mg/dL (ref 6–20)
CO2: 25 mmol/L (ref 20–29)
Calcium: 9.4 mg/dL (ref 8.7–10.2)
Chloride: 103 mmol/L (ref 96–106)
Creatinine, Ser: 0.82 mg/dL (ref 0.57–1.00)
Glucose: 104 mg/dL — ABNORMAL HIGH (ref 70–99)
Potassium: 4.5 mmol/L (ref 3.5–5.2)
Sodium: 142 mmol/L (ref 134–144)
eGFR: 98 mL/min/{1.73_m2} (ref >59)

## 2021-06-02 LAB — CBC
Hematocrit: 35 % (ref 34.0–46.6)
Hemoglobin: 11.3 g/dL (ref 11.1–15.9)
MCH: 26.2 pg — ABNORMAL LOW (ref 26.6–33.0)
MCHC: 32.3 g/dL (ref 31.5–35.7)
MCV: 81 fL (ref 79–97)
Platelets: 342 10*3/uL (ref 150–450)
RBC: 4.31 x10E6/uL (ref 3.77–5.28)
RDW: 12.8 % (ref 11.7–15.4)
WBC: 8.9 10*3/uL (ref 3.4–10.8)

## 2021-06-02 NOTE — Assessment & Plan Note (Signed)
Patient requests removal.  Advised to schedule separate appointment for this.  She wants to switch to the patch; however, I strongly advised her to consider Nexplanon.  She is a current smoker so would be at increased risk for blood clots and discussed that estrogen patch would be contraindicated after age 30 if she continues to smoke.

## 2021-06-02 NOTE — Assessment & Plan Note (Addendum)
She has self weaned citalopram due to adverse reaction.  Low suspicion for underlying bipolar disorder.  I think bupropion would be a good alternative for her given fatigue and smoking status.  No contraindications to starting. No SI. - start buproprion 150 mg daily, can increase to BID after 3 days if tolerating - d/c citalopram - continue therapy - f/u 6 weeks

## 2021-06-06 ENCOUNTER — Encounter: Payer: Self-pay | Admitting: Family Medicine

## 2021-06-08 DIAGNOSIS — F319 Bipolar disorder, unspecified: Secondary | ICD-10-CM | POA: Diagnosis not present

## 2021-06-10 ENCOUNTER — Ambulatory Visit: Payer: Medicaid Other | Admitting: Family Medicine

## 2021-06-30 ENCOUNTER — Other Ambulatory Visit: Payer: Self-pay | Admitting: Family Medicine

## 2021-07-06 DIAGNOSIS — F319 Bipolar disorder, unspecified: Secondary | ICD-10-CM | POA: Diagnosis not present

## 2021-07-20 DIAGNOSIS — F319 Bipolar disorder, unspecified: Secondary | ICD-10-CM | POA: Diagnosis not present

## 2021-07-23 ENCOUNTER — Other Ambulatory Visit: Payer: Self-pay | Admitting: Family Medicine

## 2021-07-26 ENCOUNTER — Encounter: Payer: Self-pay | Admitting: Student

## 2021-07-26 ENCOUNTER — Other Ambulatory Visit: Payer: Self-pay

## 2021-07-26 ENCOUNTER — Ambulatory Visit (INDEPENDENT_AMBULATORY_CARE_PROVIDER_SITE_OTHER): Payer: Medicaid Other | Admitting: Student

## 2021-07-26 ENCOUNTER — Encounter: Payer: Self-pay | Admitting: Hematology

## 2021-07-26 VITALS — BP 124/80 | HR 103 | Wt 180.2 lb

## 2021-07-26 DIAGNOSIS — F410 Panic disorder [episodic paroxysmal anxiety] without agoraphobia: Secondary | ICD-10-CM | POA: Diagnosis not present

## 2021-07-26 DIAGNOSIS — F331 Major depressive disorder, recurrent, moderate: Secondary | ICD-10-CM

## 2021-07-26 MED ORDER — HYDROXYZINE HCL 10 MG PO TABS: 10.0000 mg | ORAL_TABLET | Freq: Three times a day (TID) | ORAL | 0 refills | Status: DC | PRN

## 2021-07-26 MED ORDER — MIRTAZAPINE 7.5 MG PO TABS: 7.5000 mg | ORAL_TABLET | Freq: Every day | ORAL | 0 refills | Status: DC

## 2021-07-26 NOTE — Patient Instructions (Addendum)
It was great to see you! Thank you for allowing me to participate in your care!   I recommend that you always bring your medications to each appointment as this makes it easy to ensure we are on the correct medications and helps [place] not miss when refills are needed.  Our plans for today:  - I have sent in hydroxyzine for you as needed for panic attacks - I have sent in mirtazapine for you to try for depression/anxiety.  - Follow up in 4 weeks to see if it is helping -Checking some labs -Make sure you are taking right amount of nicotine lozenges as this is a stimulant as well and can worsen anxiety  Take care and seek immediate care sooner if you develop any concerns. Please remember to show up 15 minutes before your scheduled appointment time!  Gerrit Heck, MD [place]

## 2021-07-26 NOTE — Assessment & Plan Note (Addendum)
Has been having panic attacks regularly every 3 hours. Stopped Wellbutrin herself on Sunday 1/1. Patient is actively in counseling and follows closely. -Mirtazapine 7.5 mg nightly prescribed -Hydroxyzine as needed for panic attacks (low dosage given patient has side effects to many psychiatric medications) -CBC, BMP, TSH to see if any issues -Advised taking nicotine lozenges as directed as it is a stimulant as well and could increase anxiety

## 2021-07-26 NOTE — Progress Notes (Signed)
° ° °  SUBJECTIVE:   CHIEF COMPLAINT / HPI: Panic Attacks  Panic attacks Bupropion prescribed at last visit. Has previously tried sertraline that was not effective and citalopram and had an adverse reaction. Took Wellbutrin on 8th of December after being prescribed by PCP at last visit. Says these panic episodes occur randomly and has been having them since after Christmas. Believes this is the only change she has had. Stopped Wellbutrin medication this past Sunday (1/1), and says she still has panic attacks but not as frequent. During christmas time it was every hour. Since Sunday they have been spaced out to every 3 hours. She called the ambulance 2 nights ago, and they checked her BP and pulse and said everything was fine per patient. During the episodes she has been having some tightness which is dull, sometimes fingers feel numb, feels like she is going to die and has trouble swallowing. Lasts around 30 mintues. Sometimes these attacks come when she is driving, and says it is mostly out of the blue. Cleaning and walking helps her at preventing them. She says with Wellbutrin she has headaches and dry mouth but it had helped with depression. She said it did not help with tobacco cessation but she has just stopped smoking on 1/1 and started taking nicotine lozenges as directed on the package.  PERTINENT  PMH / PSH: depression, tobacco use, migraines  OBJECTIVE:   BP 124/80    Pulse (!) 103    Wt 180 lb 3.2 oz (81.7 kg)    SpO2 99%    BMI 29.99 kg/m   General: NAD, awake, alert, responsive to questions Head: Normocephalic atraumatic CV: Regular rate and rhythm no murmurs rubs or gallops Respiratory: Clear to ausculation bilaterally, no wheezes rales or crackles, chest rises symmetrically,  no increased work of breathing  ASSESSMENT/PLAN:   Complaint of panic attack Has been having panic attacks regularly every 3 hours. Stopped Wellbutrin herself on Sunday 1/1. Patient is actively in counseling  and follows closely. -Mirtazapine 7.5 mg nightly prescribed -Hydroxyzine as needed for panic attacks (low dosage given patient has side effects to many psychiatric medications) -CBC, BMP, TSH to see if any issues -Advised taking nicotine lozenges as directed as it is a stimulant as well and could increase anxiety  Depression Wellbutrin not tolerated given headaches, dry mouth and panic attacks increased. -Mirtazapine 7.5 mg nightly -f/u with PCP in 4 weeks to see effect -Consider referral to psychiatry if not tolerating mirtazapine given multiple medication trials with side effects    Gerrit Heck, MD [place]

## 2021-07-26 NOTE — Assessment & Plan Note (Signed)
Wellbutrin not tolerated given headaches, dry mouth and panic attacks increased. -Mirtazapine 7.5 mg nightly -f/u with PCP in 4 weeks to see effect -Consider referral to psychiatry if not tolerating mirtazapine given multiple medication trials with side effects

## 2021-07-27 LAB — CBC
Hematocrit: 36.6 % (ref 34.0–46.6)
Hemoglobin: 11.9 g/dL (ref 11.1–15.9)
MCH: 26.2 pg — ABNORMAL LOW (ref 26.6–33.0)
MCHC: 32.5 g/dL (ref 31.5–35.7)
MCV: 81 fL (ref 79–97)
Platelets: 334 10*3/uL (ref 150–450)
RBC: 4.54 x10E6/uL (ref 3.77–5.28)
RDW: 13.1 % (ref 11.7–15.4)
WBC: 8.4 10*3/uL (ref 3.4–10.8)

## 2021-07-27 LAB — BASIC METABOLIC PANEL
BUN/Creatinine Ratio: 6 — ABNORMAL LOW (ref 9–23)
BUN: 5 mg/dL — ABNORMAL LOW (ref 6–20)
CO2: 23 mmol/L (ref 20–29)
Calcium: 8.8 mg/dL (ref 8.7–10.2)
Chloride: 102 mmol/L (ref 96–106)
Creatinine, Ser: 0.77 mg/dL (ref 0.57–1.00)
Glucose: 100 mg/dL — ABNORMAL HIGH (ref 70–99)
Potassium: 4.1 mmol/L (ref 3.5–5.2)
Sodium: 138 mmol/L (ref 134–144)
eGFR: 105 mL/min/{1.73_m2} (ref >59)

## 2021-07-27 LAB — TSH: TSH: 1.29 u[IU]/mL (ref 0.450–4.500)

## 2021-08-17 ENCOUNTER — Other Ambulatory Visit: Payer: Self-pay | Admitting: Student

## 2021-08-17 DIAGNOSIS — F331 Major depressive disorder, recurrent, moderate: Secondary | ICD-10-CM

## 2021-08-31 DIAGNOSIS — F319 Bipolar disorder, unspecified: Secondary | ICD-10-CM | POA: Diagnosis not present

## 2021-09-14 DIAGNOSIS — F319 Bipolar disorder, unspecified: Secondary | ICD-10-CM | POA: Diagnosis not present

## 2021-09-28 DIAGNOSIS — F319 Bipolar disorder, unspecified: Secondary | ICD-10-CM | POA: Diagnosis not present

## 2021-09-30 DIAGNOSIS — H5213 Myopia, bilateral: Secondary | ICD-10-CM | POA: Diagnosis not present

## 2021-10-12 ENCOUNTER — Other Ambulatory Visit: Payer: Self-pay

## 2021-10-12 ENCOUNTER — Encounter: Payer: Self-pay | Admitting: Student

## 2021-10-12 ENCOUNTER — Ambulatory Visit (INDEPENDENT_AMBULATORY_CARE_PROVIDER_SITE_OTHER): Payer: Medicaid Other | Admitting: Student

## 2021-10-12 ENCOUNTER — Other Ambulatory Visit (HOSPITAL_COMMUNITY)
Admission: RE | Admit: 2021-10-12 | Discharge: 2021-10-12 | Disposition: A | Discharge status: Home / Self Care | Payer: Medicaid Other | Source: Ambulatory Visit | Attending: Family Medicine | Admitting: Family Medicine

## 2021-10-12 VITALS — BP 110/75 | HR 81 | Ht 65.0 in | Wt 186.6 lb

## 2021-10-12 DIAGNOSIS — Z113 Encounter for screening for infections with a predominantly sexual mode of transmission: Secondary | ICD-10-CM | POA: Diagnosis not present

## 2021-10-12 DIAGNOSIS — N898 Other specified noninflammatory disorders of vagina: Secondary | ICD-10-CM | POA: Diagnosis not present

## 2021-10-12 NOTE — Assessment & Plan Note (Signed)
Patient with recent sexual encounter and symptoms beginning next day. Symptoms of pain with/after sex, burning with urination, itching, odor, cramps, and discharge. Pelvic exam was notable for discharge with normal appearing cervix. Patient complained of tenderness in vagina, but tolerated pelvic exam well. Patient has complained of some nausea and diarrhea, but has no other systemic symptoms (fever, abdominal pain). At this time I have low concern for Pelvic Inflammatory Disease. It does appear that patient has some type of STI.  ?-Pelvic Exam as above ?-Test for GC/Chlamydia/Trich/Yeast ?-Return to lab for HIV and RPR testing ?

## 2021-10-12 NOTE — Patient Instructions (Signed)
It was great to see you! Thank you for allowing me to participate in your care! ? ?We saw you today for a pelvic exam for concerns of discharge and pain.  ? ?Our plans for today:  ?- Pelvic Exam ?- STI testing: GC, Chlamydia, Trichomonas, Yeast ?- Blood test: HIV and Syphilis (RPR) ? ?We are checking some labs today, I will call you if they are abnormal will send you a MyChart message or a letter if they are normal.  If you do not hear about your labs in the next 2 weeks please let us know. ? ?Take care and seek immediate care sooner if you develop any concerns.  ? ?Dr. Holley Bouche, MD ?[place] ? ?

## 2021-10-12 NOTE — Progress Notes (Signed)
?  SUBJECTIVE:  ? ?CHIEF COMPLAINT / HPI:  ? ?Here for STI screening. ? ?Vaginal order and discharge for last 5-6 days. Also has itching and it burns when she pee's, rated as a 2/10. Patient has new partner as of 10/08/21. Also feels tenderness/swelling on inside of vagina. Symptoms began the next day. Patient is sexually active with another woman. No protective barriers with sexual intercourse. Discharge was clear, and now is cloudy/yellow mucous looking. Patient also notes cramping and nausea started monday. Notes that she has had diarrhea since Monday. Also notes pain with sexual activity, sharp/throbbing pain. Pain last after sex and is still hurting now. Also appreciated a little blood tinged discharge on 18th. Also appreciated increased urinary frequency. Smell wont go away, despite washing. Patient has IUD. ? ? ?PERTINENT  PMH / PSH: HSV 2, IUD, migraine, depression, tobacco abuse ? ? ?OBJECTIVE:  ?BP 110/75   Pulse 81   Ht '5\' 5"'$  (1.651 m)   Wt 186 lb 9.6 oz (84.6 kg)   SpO2 99%   BMI 31.05 kg/m?  ? ?General: NAD, pleasant, able to participate in exam ?Neuro: alert, no obvious focal deficits, speech normal ?Psych: Normal affect and mood ?Physical Exam ?Genitourinary: ?   Vagina: Vaginal discharge (white and thin), tenderness and bleeding present. No erythema or lesions.  ?   Cervix: Normal.  ?   Adnexa:     ?   Right: Tenderness present.     ?   Left: Tenderness present.   ? ? ? ?ASSESSMENT/PLAN:  ?Vaginal discharge ?Patient with recent sexual encounter and symptoms beginning next day. Symptoms of pain with/after sex, burning with urination, itching, odor, cramps, and discharge. Pelvic exam was notable for discharge with normal appearing cervix. Patient complained of tenderness in vagina, but tolerated pelvic exam well. Patient has complained of some nausea and diarrhea, but has no other systemic symptoms (fever, abdominal pain). At this time I have low concern for Pelvic Inflammatory Disease. It does  appear that patient has some type of STI.  ?-Pelvic Exam as above ?-Test for GC/Chlamydia/Trich/Yeast ?-Return to lab for HIV and RPR testing ?  ?Orders Placed This Encounter  ?Procedures  ? HIV antibody (with reflex)  ?  Standing Status:   Future  ?  Standing Expiration Date:   10/13/2022  ? RPR  ?  Standing Status:   Future  ?  Standing Expiration Date:   10/13/2022  ? ?No orders of the defined types were placed in this encounter. ? ?No follow-ups on file. ?'@[place]'$ @ ? ?

## 2021-10-18 LAB — CERVICOVAGINAL ANCILLARY ONLY
Bacterial Vaginitis (gardnerella): POSITIVE — AB
Candida Glabrata: NEGATIVE
Candida Vaginitis: NEGATIVE
Chlamydia: NEGATIVE
Comment: NEGATIVE
Comment: NEGATIVE
Comment: NEGATIVE
Comment: NEGATIVE
Comment: NEGATIVE
Comment: NORMAL
Neisseria Gonorrhea: NEGATIVE
Trichomonas: NEGATIVE

## 2021-10-19 MED ORDER — METRONIDAZOLE 500 MG PO TABS: 500.0000 mg | ORAL_TABLET | Freq: Two times a day (BID) | ORAL | 0 refills | Status: AC

## 2021-10-19 NOTE — Addendum Note (Signed)
Addended by: Holley Bouche T on: 10/19/2021 04:48 PM ? ? Modules accepted: Orders ? ?

## 2021-10-26 DIAGNOSIS — F319 Bipolar disorder, unspecified: Secondary | ICD-10-CM | POA: Diagnosis not present

## 2021-10-27 ENCOUNTER — Ambulatory Visit: Payer: Medicaid Other | Admitting: Student

## 2021-11-09 DIAGNOSIS — F319 Bipolar disorder, unspecified: Secondary | ICD-10-CM | POA: Diagnosis not present

## 2021-11-23 ENCOUNTER — Encounter: Payer: Self-pay | Admitting: Family Medicine

## 2021-11-23 DIAGNOSIS — F319 Bipolar disorder, unspecified: Secondary | ICD-10-CM | POA: Diagnosis not present

## 2021-12-07 DIAGNOSIS — F319 Bipolar disorder, unspecified: Secondary | ICD-10-CM | POA: Diagnosis not present

## 2021-12-21 DIAGNOSIS — F319 Bipolar disorder, unspecified: Secondary | ICD-10-CM | POA: Diagnosis not present

## 2021-12-27 ENCOUNTER — Encounter: Payer: Self-pay | Admitting: *Deleted

## 2022-01-04 DIAGNOSIS — F319 Bipolar disorder, unspecified: Secondary | ICD-10-CM | POA: Diagnosis not present

## 2022-01-23 DIAGNOSIS — Z9889 Other specified postprocedural states: Secondary | ICD-10-CM | POA: Diagnosis not present

## 2022-02-03 DIAGNOSIS — A609 Anogenital herpesviral infection, unspecified: Secondary | ICD-10-CM | POA: Diagnosis not present

## 2022-02-03 DIAGNOSIS — Z30432 Encounter for removal of intrauterine contraceptive device: Secondary | ICD-10-CM | POA: Diagnosis not present

## 2022-02-17 DIAGNOSIS — F319 Bipolar disorder, unspecified: Secondary | ICD-10-CM | POA: Diagnosis not present

## 2022-03-04 DIAGNOSIS — F319 Bipolar disorder, unspecified: Secondary | ICD-10-CM | POA: Diagnosis not present

## 2022-03-17 DIAGNOSIS — F319 Bipolar disorder, unspecified: Secondary | ICD-10-CM | POA: Diagnosis not present

## 2022-03-27 ENCOUNTER — Other Ambulatory Visit: Payer: Self-pay

## 2022-03-27 ENCOUNTER — Emergency Department (HOSPITAL_BASED_OUTPATIENT_CLINIC_OR_DEPARTMENT_OTHER)
Admission: EM | Admit: 2022-03-27 | Discharge: 2022-03-27 | Disposition: A | Discharge status: Home / Self Care | Payer: Medicaid Other | Attending: Emergency Medicine | Admitting: Emergency Medicine

## 2022-03-27 DIAGNOSIS — Z91010 Allergy to peanuts: Secondary | ICD-10-CM | POA: Diagnosis not present

## 2022-03-27 DIAGNOSIS — Z28310 Unvaccinated for covid-19: Secondary | ICD-10-CM | POA: Diagnosis not present

## 2022-03-27 DIAGNOSIS — U071 COVID-19: Secondary | ICD-10-CM | POA: Insufficient documentation

## 2022-03-27 DIAGNOSIS — R5383 Other fatigue: Secondary | ICD-10-CM | POA: Diagnosis present

## 2022-03-27 LAB — RESP PANEL BY RT-PCR (FLU A&B, COVID) ARPGX2
Influenza A by PCR: NEGATIVE
Influenza B by PCR: NEGATIVE
SARS Coronavirus 2 by RT PCR: POSITIVE — AB

## 2022-03-27 MED ORDER — ACETAMINOPHEN 325 MG PO TABS
650.0000 mg | ORAL_TABLET | Freq: Once | ORAL | Status: AC
  Administered 2022-03-27: 650 mg via ORAL
  Filled 2022-03-27: qty 2

## 2022-03-27 MED ORDER — NIRMATRELVIR/RITONAVIR (PAXLOVID)TABLET: 3.0000 | ORAL_TABLET | Freq: Two times a day (BID) | ORAL | 0 refills | Status: AC

## 2022-03-27 NOTE — ED Triage Notes (Signed)
Patient arrives with complaints of being Covid+ (positive home test)  Patient reports worsening chills, fatigue, sore throat, and body aches.   Rates pain a 9/10.

## 2022-03-27 NOTE — ED Notes (Signed)
Patient ambulated to room with steady gait. SpO2 99% during ambulation.

## 2022-03-27 NOTE — ED Notes (Signed)
Pt discharged. Instructions and prescriptions given. AAOX4. Pt in no apparent distress with moderate pain. The opportunity to ask questions was provided.  

## 2022-03-27 NOTE — ED Provider Notes (Signed)
[facility] EMERGENCY DEPT Provider Note   CSN: [id number] Arrival date & time: 03/27/22  3329     History  Chief Complaint  Patient presents with   Covid Positive   Fatigue    Kathryn Fox is a 31 y.o. female.  Patient here after tested positive for COVID.  She has been having body aches and fevers and chills.  Denies any major medical history or problems otherwise.  No shortness of breath or chest pain.  Took Excedrin prior to coming here.  Is vaccinated.  Is interested in antiviral.  Denies any headache, neck pain, stroke symptoms.  The history is provided by the patient.       Home Medications Prior to Admission medications   Medication Sig Start Date End Date Taking? Authorizing Provider  nirmatrelvir/ritonavir EUA (PAXLOVID) 20 x 150 MG & 10 x '100MG'$  TABS Take 3 tablets by mouth 2 (two) times daily for 5 days. Patient GFR is >60 . Take nirmatrelvir (150 mg) two tablets twice daily for 5 days and ritonavir (100 mg) one tablet twice daily for 5 days. 03/27/22 04/01/22 Yes Messiah Rovira, DO  hydrOXYzine (ATARAX) 10 MG tablet Take 1 tablet (10 mg total) by mouth 3 (three) times daily as needed. 07/26/21   Gerrit Heck, MD  Levonorgestrel (MIRENA, 52 MG, IU) by Intrauterine route.    [provider]  mirtazapine (REMERON) 7.5 MG tablet TAKE 1 TABLET BY MOUTH AT BEDTIME. 08/17/21   Zola Button, MD  valACYclovir (VALTREX) 500 MG tablet Take 500 mg by mouth daily. 08/15/18   [provider]      Allergies    Mushroom extract complex, Peanut-containing drug products, Penicillins, and Septra [sulfamethoxazole-trimethoprim]    Review of Systems   Review of Systems  Physical Exam Updated Vital Signs BP 120/74 (BP Location: Right Arm)   Pulse 99   Temp 100.3 F (37.9 C) (Oral)   Resp 20   Ht '5\' 5"'$  (1.651 m)   Wt 81.6 kg   LMP 03/02/2022   SpO2 99%   BMI 29.95 kg/m  Physical Exam Vitals and nursing note reviewed.  Constitutional:       General: She is not in acute distress.    Appearance: She is well-developed. She is not ill-appearing.  HENT:     Head: Normocephalic and atraumatic.     Nose: Nose normal.     Mouth/Throat:     Mouth: Mucous membranes are moist.  Eyes:     Extraocular Movements: Extraocular movements intact.     Conjunctiva/sclera: Conjunctivae normal.     Pupils: Pupils are equal, round, and reactive to light.  Cardiovascular:     Rate and Rhythm: Normal rate and regular rhythm.     Pulses: Normal pulses.     Heart sounds: Normal heart sounds. No murmur heard. Pulmonary:     Effort: Pulmonary effort is normal. No respiratory distress.     Breath sounds: Normal breath sounds.  Abdominal:     Palpations: Abdomen is soft.     Tenderness: There is no abdominal tenderness.  Musculoskeletal:        General: No swelling.     Cervical back: Normal range of motion and neck supple.  Skin:    General: Skin is warm and dry.     Capillary Refill: Capillary refill takes less than 2 seconds.  Neurological:     General: No focal deficit present.     Mental Status: She is alert.  Psychiatric:  Mood and Affect: Mood normal.     ED Results / Procedures / Treatments   Labs (all labs ordered are listed, but only abnormal results are displayed) Labs Reviewed  RESP PANEL BY RT-PCR (FLU A&B, COVID) ARPGX2    EKG None  Radiology No results found.  Procedures Procedures    Medications Ordered in ED Medications  acetaminophen (TYLENOL) tablet 650 mg (has no administration in time range)    ED Course/ Medical Decision Making/ A&P                           Medical Decision Making Risk OTC drugs.   Kathryn Fox is here with body aches and fever and COVID.  Tested positive for COVID prior to arrival.  Took a dose of Excedrin.  She has been having body aches and sore throat and chills.  Overall she has normal vitals.  Pulse ox is within normal limits.  Both at rest and with ambulation her  pulse ox is 100%.  She has no signs of respiratory distress.  She is vaccinated.  She is interested in the Paxlovid treatment and I will provide this.  Patient given Tylenol.  Recommend supportive care at home and given return precautions.  Discharged in good condition.  This chart was dictated using voice recognition software.  Despite best efforts to proofread,  errors can occur which can change the documentation meaning.         Final Clinical Impression(s) / ED Diagnoses Final diagnoses:  COVID-19    Rx / DC Orders ED Discharge Orders          Ordered    nirmatrelvir/ritonavir EUA (PAXLOVID) 20 x 150 MG & 10 x '100MG'$  TABS  2 times daily        03/27/22 1936              Shaunita Seney, [place], DO 03/27/22 1940

## 2022-03-31 DIAGNOSIS — F319 Bipolar disorder, unspecified: Secondary | ICD-10-CM | POA: Diagnosis not present

## 2022-04-10 ENCOUNTER — Telehealth: Payer: Self-pay

## 2022-04-10 NOTE — Telephone Encounter (Signed)
Patient calls nurse line reporting chest discomfort.   Patient reports a "heaviness" and "slight" pain when she takes in a breath. Patient reports she feels like something in stuck in the center of her chest.   Patient reports she had Covid the beginning of this month. Patient denies congestion, fevers, SOB or cough.   Patient speaking in full sentences.   ED precautions given.   Patient scheduled for tomorrow for evaluation.

## 2022-04-10 NOTE — Progress Notes (Unsigned)
    SUBJECTIVE:   CHIEF COMPLAINT / HPI:   Ambulatory chest pain evaluation:  Izzabella Besse Marlowe is a 31 y.o. female presenting with chest pain. ***  Chest pain began ***  Pain is ***, Rate ***/10 and radiates to *** It is worse with *** and improves with *** *** Patient believes might be causing their pain: ***  {Associated Symptoms:25148}  {Patient Denies:25149}     The most likely diagnosis is***.  I have considering incomplete the patient has a very low likelihood of having a myocardial infarction/unstable angina, pulmonary embolus, ***    [email or web address]  [place] Calculator  [email or web address]   [email or web address]     PERTINENT  PMH / PSH: ***  OBJECTIVE:   LMP 03/02/2022   ***  Physical Exam General: Alert, well appearing, NAD, Oriented x4 Cardiovascular: RRR, No Murmurs, Normal S2/S2 Respiratory: CTAB, No wheezing or Rales Abdomen: No distension or tenderness Extremities: No edema on extremities   Skin: Warm and dry  ASSESSMENT/PLAN:   No problem-specific Assessment & Plan notes found for this encounter.     Alen Bleacher, MD [place]   {    This will disappear when note is signed, click to select method of visit    :1}

## 2022-04-11 ENCOUNTER — Ambulatory Visit (INDEPENDENT_AMBULATORY_CARE_PROVIDER_SITE_OTHER): Payer: Medicaid Other | Admitting: Student

## 2022-04-11 ENCOUNTER — Ambulatory Visit (HOSPITAL_COMMUNITY)
Admission: RE | Admit: 2022-04-11 | Discharge: 2022-04-11 | Disposition: A | Discharge status: Home / Self Care | Payer: Medicaid Other | Source: Ambulatory Visit | Attending: Family Medicine | Admitting: Family Medicine

## 2022-04-11 VITALS — BP 118/62 | HR 91 | Ht 65.0 in | Wt 195.8 lb

## 2022-04-11 DIAGNOSIS — F419 Anxiety disorder, unspecified: Secondary | ICD-10-CM | POA: Diagnosis not present

## 2022-04-11 DIAGNOSIS — R079 Chest pain, unspecified: Secondary | ICD-10-CM | POA: Diagnosis not present

## 2022-04-11 DIAGNOSIS — F410 Panic disorder [episodic paroxysmal anxiety] without agoraphobia: Secondary | ICD-10-CM

## 2022-04-11 MED ORDER — SERTRALINE HCL 25 MG PO TABS: 25.0000 mg | ORAL_TABLET | Freq: Every day | ORAL | 0 refills | Status: DC

## 2022-04-11 MED ORDER — HYDROXYZINE HCL 10 MG PO TABS
10.0000 mg | ORAL_TABLET | Freq: Three times a day (TID) | ORAL | 0 refills | Status: DC | PRN
Start: 2022-04-11 — End: 2024-04-25

## 2022-04-11 NOTE — Patient Instructions (Addendum)
It was wonderful to see you today. Thank you for allowing me to be a part of your care. Below is a short summary of what we discussed at your visit today:  Chest pain is likely due to anxiety.  Take your hydroxyzine scheduled and follow up in 2 weeks to reassess chest pain.   Follow-up in 2 weeks to assess symptom improvement.  Please bring all of your medications to every appointment!  If you have any questions or concerns, please do not hesitate to contact us via phone or MyChart message.   Alen Bleacher, MD [facility]

## 2022-04-29 DIAGNOSIS — F319 Bipolar disorder, unspecified: Secondary | ICD-10-CM | POA: Diagnosis not present

## 2022-08-05 DIAGNOSIS — F319 Bipolar disorder, unspecified: Secondary | ICD-10-CM | POA: Diagnosis not present

## 2022-08-14 ENCOUNTER — Encounter: Payer: Self-pay | Admitting: Family Medicine

## 2022-08-14 ENCOUNTER — Other Ambulatory Visit (HOSPITAL_COMMUNITY)
Admission: RE | Admit: 2022-08-14 | Discharge: 2022-08-14 | Disposition: A | Discharge status: Home / Self Care | Payer: Medicaid Other | Source: Ambulatory Visit | Attending: Family Medicine | Admitting: Family Medicine

## 2022-08-14 ENCOUNTER — Ambulatory Visit (INDEPENDENT_AMBULATORY_CARE_PROVIDER_SITE_OTHER): Payer: Medicaid Other | Admitting: Family Medicine

## 2022-08-14 VITALS — BP 118/71 | HR 90 | Temp 98.6°F | Ht 65.0 in | Wt 197.4 lb

## 2022-08-14 DIAGNOSIS — Z113 Encounter for screening for infections with a predominantly sexual mode of transmission: Secondary | ICD-10-CM | POA: Insufficient documentation

## 2022-08-14 DIAGNOSIS — R3915 Urgency of urination: Secondary | ICD-10-CM | POA: Diagnosis not present

## 2022-08-14 DIAGNOSIS — N898 Other specified noninflammatory disorders of vagina: Secondary | ICD-10-CM | POA: Diagnosis not present

## 2022-08-14 LAB — POCT URINALYSIS DIP (CLINITEK)
Bilirubin, UA: NEGATIVE
Glucose, UA: NEGATIVE mg/dL
Ketones, POC UA: NEGATIVE mg/dL
Leukocytes, UA: NEGATIVE
Nitrite, UA: NEGATIVE
Spec Grav, UA: 1.02 (ref 1.010–1.025)
Urobilinogen, UA: 1 E.U./dL
pH, UA: 7 (ref 5.0–8.0)

## 2022-08-14 LAB — POCT WET PREP (WET MOUNT)
Clue Cells Wet Prep Whiff POC: NEGATIVE
Trichomonas Wet Prep HPF POC: ABSENT

## 2022-08-14 LAB — POCT UA - MICROSCOPIC ONLY: WBC, Ur, HPF, POC: NONE SEEN (ref 0–5)

## 2022-08-14 NOTE — Progress Notes (Signed)
    SUBJECTIVE:   CHIEF COMPLAINT / HPI:  Chief Complaint  Patient presents with   Possible uti    Reports malodorous urine, maybe a little urinary urgency for the past 3 weeks. Also some vaginal itching. Denies urinary frequency, dysuria, vaginal discharge. She took OTC yeast medication (Azo) without relief.  Patient denies any new sexual partners.  She is monogamous with her wife.  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  Constitutional:      General: She is not in acute distress. Cardiovascular:     Rate and Rhythm: Normal rate and regular rhythm.     Heart sounds: Normal heart sounds.  Pulmonary:     Effort: Pulmonary effort is normal. No respiratory distress.     Breath sounds: Normal breath sounds.  Abdominal:     Palpations: Abdomen is soft.     Tenderness: There is no abdominal tenderness. There is no right CVA tenderness or left CVA tenderness.  Genitourinary:    General: Normal vulva.     Comments: Scant amount of thick white vaginal discharge noted in the vaginal vault Musculoskeletal:     Cervical back: Neck supple.  Neurological:     Mental Status: She is alert.         08/14/2022    3:04 PM  Depression screen PHQ 2/9  Decreased Interest 1  Down, Depressed, Hopeless 1  PHQ - 2 Score 2  Altered sleeping 2  Tired, decreased energy 2  Change in appetite 0  Feeling bad or failure about yourself  0  Trouble concentrating 1  Moving slowly or fidgety/restless 1  Suicidal thoughts 0  PHQ-9 Score 8     {Show previous vital signs (optional):23777}    ASSESSMENT/PLAN:      1. Urinary urgency UA does not suggest UTI, will send for culture.  By history suspicion for UTI is less likely. - POCT URINALYSIS DIP (CLINITEK) - POCT UA - Microscopic Only - Urine Culture  2. Vaginal itching Wet prep negative for yeast, will await other testing. - HIV Antibody (routine testing w rflx) (Age 27-75) - Cervicovaginal ancillary only (Gonorrhea/chlamydia) - RPR - POCT Wet Prep [facility])   Return if symptoms worsen or fail to improve.   Zola Button, MD [place]

## 2022-08-14 NOTE — Patient Instructions (Addendum)
It was nice seeing you today!  I am sending urine for culture and will let you know if we need to start antibiotics for UTI.  The rest your results will be available in the next 1 to 2 days.  Stay well, Zola Button, MD [place] [phone]  --  Make sure to check out at the front desk before you leave today.  Please arrive at least 15 minutes prior to your scheduled appointments.  If you had blood work today, I will send you a MyChart message or a letter if results are normal. Otherwise, I will give you a call.  If you had a referral placed, they will call you to set up an appointment. Please give [place] a call if you don't hear back in the next 2 weeks.  If you need additional refills before your next appointment, please call your pharmacy first.

## 2022-08-15 LAB — CERVICOVAGINAL ANCILLARY ONLY
Bacterial Vaginitis (gardnerella): NEGATIVE
Candida Glabrata: NEGATIVE
Candida Vaginitis: NEGATIVE
Chlamydia: NEGATIVE
Comment: NEGATIVE
Comment: NEGATIVE
Comment: NEGATIVE
Comment: NEGATIVE
Comment: NORMAL
Neisseria Gonorrhea: NEGATIVE

## 2022-08-15 LAB — HIV ANTIBODY (ROUTINE TESTING W REFLEX): HIV Screen 4th Generation wRfx: NONREACTIVE

## 2022-08-15 LAB — RPR: RPR Ser Ql: NONREACTIVE

## 2022-08-16 ENCOUNTER — Encounter: Payer: Self-pay | Admitting: Family Medicine

## 2022-08-16 LAB — URINE CULTURE

## 2022-08-16 MED ORDER — NITROFURANTOIN MONOHYD MACRO 100 MG PO CAPS: 100.0000 mg | ORAL_CAPSULE | Freq: Two times a day (BID) | ORAL | 0 refills | Status: AC

## 2022-08-16 NOTE — Addendum Note (Signed)
Addended by: Zola Button D on: 08/16/2022 10:58 AM   Modules accepted: Orders

## 2022-08-18 DIAGNOSIS — F319 Bipolar disorder, unspecified: Secondary | ICD-10-CM | POA: Diagnosis not present

## 2022-08-23 ENCOUNTER — Telehealth: Payer: Self-pay

## 2022-08-23 NOTE — Telephone Encounter (Signed)
Patient calls nurse line regarding results from urine culture on 08/14/22. Advised that provider had sent her a mychart message explaining that she tested positive for E-coli and that abx had been sent to pharmacy.   Patient will pick up abx today. Answered all questions.   Talbot Grumbling, RN

## 2022-09-01 DIAGNOSIS — F319 Bipolar disorder, unspecified: Secondary | ICD-10-CM | POA: Diagnosis not present

## 2022-09-04 ENCOUNTER — Other Ambulatory Visit: Payer: Self-pay | Admitting: Family Medicine

## 2022-09-04 ENCOUNTER — Ambulatory Visit (INDEPENDENT_AMBULATORY_CARE_PROVIDER_SITE_OTHER): Payer: Medicaid Other | Admitting: Family Medicine

## 2022-09-04 ENCOUNTER — Other Ambulatory Visit (HOSPITAL_COMMUNITY): Payer: Self-pay | Admitting: *Deleted

## 2022-09-04 ENCOUNTER — Encounter: Payer: Self-pay | Admitting: Family Medicine

## 2022-09-04 VITALS — BP 118/78 | HR 75 | Ht 65.0 in | Wt 191.2 lb

## 2022-09-04 DIAGNOSIS — F331 Major depressive disorder, recurrent, moderate: Secondary | ICD-10-CM

## 2022-09-04 DIAGNOSIS — R1310 Dysphagia, unspecified: Secondary | ICD-10-CM

## 2022-09-04 DIAGNOSIS — T71193A Asphyxiation due to mechanical threat to breathing due to other causes, assault, initial encounter: Secondary | ICD-10-CM | POA: Diagnosis present

## 2022-09-04 MED ORDER — FLUOXETINE HCL 20 MG PO TABS: 20.0000 mg | ORAL_TABLET | Freq: Every day | ORAL | 1 refills | Status: DC

## 2022-09-04 NOTE — Assessment & Plan Note (Signed)
Mirtazapine effective but causes significant sedation so we will trial fluoxetine. - start fluoxetine 20 mg daily - d/c mirtazapine - established with therapy - referral to psychiatry as she has failed multiple antidepressants (sertraline, citalopram, bupropion, mirtazapine)

## 2022-09-04 NOTE — Patient Instructions (Addendum)
It was nice seeing you today!  I have ordered an x-ray of your neck as well as a barium swallow study.  Referral has been placed to ENT.  They will call you to schedule the barium swallow study.  You do not need an appointment to get your x-rays done here. [facility] Address: [address], [place],  [address] Phone: [phone]   Start taking fluoxetine (Prozac) once a day.  Able to take 4 to 6 weeks for it to take effect.  I have placed a referral to a psychiatrist.  Stay well, Zola Button, MD [place] [phone]  --  Make sure to check out at the front desk before you leave today.  Please arrive at least 15 minutes prior to your scheduled appointments.  If you had blood work today, I will send you a MyChart message or a letter if results are normal. Otherwise, I will give you a call.  If you had a referral placed, they will call you to set up an appointment. Please give [place] a call if you don't hear back in the next 2 weeks.  If you need additional refills before your next appointment, please call your pharmacy first.
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
## 2022-09-06 ENCOUNTER — Ambulatory Visit
Admission: RE | Admit: 2022-09-06 | Discharge: 2022-09-06 | Disposition: A | Discharge status: Home / Self Care | Payer: Medicaid Other | Source: Ambulatory Visit | Attending: Family Medicine | Admitting: Family Medicine

## 2022-09-06 DIAGNOSIS — R1310 Dysphagia, unspecified: Secondary | ICD-10-CM | POA: Diagnosis not present

## 2022-09-06 DIAGNOSIS — J029 Acute pharyngitis, unspecified: Secondary | ICD-10-CM | POA: Diagnosis not present

## 2022-09-06 DIAGNOSIS — T71193A Asphyxiation due to mechanical threat to breathing due to other causes, assault, initial encounter: Secondary | ICD-10-CM

## 2022-09-06 DIAGNOSIS — M542 Cervicalgia: Secondary | ICD-10-CM | POA: Diagnosis not present

## 2022-09-07 DIAGNOSIS — T71194S Asphyxiation due to mechanical threat to breathing due to other causes, undetermined, sequela: Secondary | ICD-10-CM | POA: Diagnosis not present

## 2022-09-07 DIAGNOSIS — R070 Pain in throat: Secondary | ICD-10-CM | POA: Diagnosis not present

## 2022-09-13 ENCOUNTER — Ambulatory Visit (HOSPITAL_COMMUNITY)
Admission: RE | Admit: 2022-09-13 | Discharge: 2022-09-13 | Disposition: A | Discharge status: Home / Self Care | Payer: Medicaid Other | Source: Ambulatory Visit

## 2022-09-13 ENCOUNTER — Ambulatory Visit (HOSPITAL_COMMUNITY)
Admission: RE | Admit: 2022-09-13 | Discharge: 2022-09-13 | Disposition: A | Discharge status: Home / Self Care | Payer: Medicaid Other | Source: Ambulatory Visit | Attending: Family Medicine | Admitting: Family Medicine

## 2022-09-13 DIAGNOSIS — X58XXXA Exposure to other specified factors, initial encounter: Secondary | ICD-10-CM | POA: Diagnosis not present

## 2022-09-13 DIAGNOSIS — R1310 Dysphagia, unspecified: Secondary | ICD-10-CM | POA: Diagnosis not present

## 2022-09-13 DIAGNOSIS — T71193A Asphyxiation due to mechanical threat to breathing due to other causes, assault, initial encounter: Secondary | ICD-10-CM | POA: Insufficient documentation
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
## 2022-09-16 DIAGNOSIS — F319 Bipolar disorder, unspecified: Secondary | ICD-10-CM | POA: Diagnosis not present

## 2022-09-24 ENCOUNTER — Other Ambulatory Visit: Payer: Self-pay | Admitting: Family Medicine

## 2022-09-29 DIAGNOSIS — F319 Bipolar disorder, unspecified: Secondary | ICD-10-CM | POA: Diagnosis not present

## 2022-10-27 DIAGNOSIS — F319 Bipolar disorder, unspecified: Secondary | ICD-10-CM | POA: Diagnosis not present

## 2022-11-10 DIAGNOSIS — F319 Bipolar disorder, unspecified: Secondary | ICD-10-CM | POA: Diagnosis not present

## 2022-11-28 ENCOUNTER — Ambulatory Visit (INDEPENDENT_AMBULATORY_CARE_PROVIDER_SITE_OTHER): Payer: Medicaid Other | Admitting: Family Medicine

## 2022-11-28 ENCOUNTER — Encounter: Payer: Self-pay | Admitting: Family Medicine

## 2022-11-28 VITALS — BP 127/77 | HR 94 | Ht 65.0 in | Wt 194.2 lb

## 2022-11-28 DIAGNOSIS — E6609 Other obesity due to excess calories: Secondary | ICD-10-CM | POA: Diagnosis not present

## 2022-11-28 DIAGNOSIS — E669 Obesity, unspecified: Secondary | ICD-10-CM | POA: Insufficient documentation

## 2022-11-28 DIAGNOSIS — R7303 Prediabetes: Secondary | ICD-10-CM | POA: Diagnosis not present

## 2022-11-28 DIAGNOSIS — Z6832 Body mass index (BMI) 32.0-32.9, adult: Secondary | ICD-10-CM

## 2022-11-28 DIAGNOSIS — Z30016 Encounter for initial prescription of transdermal patch hormonal contraceptive device: Secondary | ICD-10-CM

## 2022-11-28 LAB — POCT URINE PREGNANCY: Preg Test, Ur: NEGATIVE

## 2022-11-28 MED ORDER — NORELGESTROMIN-ETH ESTRADIOL 150-35 MCG/24HR TD PTWK: 1.0000 | MEDICATED_PATCH | TRANSDERMAL | 12 refills | Status: AC

## 2022-11-28 MED ORDER — TIRZEPATIDE-WEIGHT MANAGEMENT 2.5 MG/0.5ML ~~LOC~~ SOAJ: 2.5000 mg | SUBCUTANEOUS | 0 refills | Status: DC

## 2022-11-28 NOTE — Assessment & Plan Note (Addendum)
Check for comorbidities and start GLP-1/GIP, I think she would greatly benefit from the medication to help with weight loss.  Discussed that insurance may not cover medication.

## 2022-11-28 NOTE — Patient Instructions (Addendum)
It was nice seeing you today!  I have sent in the Zepbound for weight loss.  You can start the Xulane estrogen patch the first Sunday after your next period or when your next period starts.  Stay well, Littie Deeds, MD [facility] [phone]  --  Make sure to check out at the front desk before you leave today.  Please arrive at least 15 minutes prior to your scheduled appointments.  If you had blood work today, I will send you a MyChart message or a letter if results are normal. Otherwise, I will give you a call.  If you had a referral placed, they will call you to set up an appointment. Please give [place] a call if you don't hear back in the next 2 weeks.  If you need additional refills before your next appointment, please call your pharmacy first.

## 2022-11-28 NOTE — Progress Notes (Signed)
    SUBJECTIVE:   CHIEF COMPLAINT / HPI:    Patient would like to discuss weight loss medications. She reports family member has done well with [facility] and would like to be put on this medication. She states that she would pay out-of-pocket for the medication if she had to.  Mirena IUD removed by [facility] within the past year, does not remember exactly when.  She would like to switch to the estrogen patch. Former smoker, quit about a year and a half ago and is now vaping. History of migraines without aura, reports that she has not had any recent issues with migraines. No history of blood clots, stroke.  Separated from wife, will be going through divorce.  PERTINENT  PMH / PSH: GERD, depression, tobacco use, migraines  Patient Care Team: Littie Deeds, MD as PCP - General (Family Medicine) Sherian Rein, MD as Consulting Physician (Obstetrics and Gynecology)   OBJECTIVE:   BP 127/77   Pulse 94   Ht 5\' 5"  (1.651 m)   Wt 194 lb 4 oz (88.1 kg)   LMP 11/14/2022   SpO2 98%   BMI 32.32 kg/m   Physical Exam Constitutional:      General: She is not in acute distress. Cardiovascular:     Rate and Rhythm: Normal rate and regular rhythm.  Pulmonary:     Effort: Pulmonary effort is normal. No respiratory distress.     Breath sounds: Normal breath sounds.  Musculoskeletal:     Cervical back: Neck supple.  Neurological:     Mental Status: She is alert.         11/28/2022    1:47 PM  Depression screen PHQ 2/9  Decreased Interest 2  Down, Depressed, Hopeless 2  PHQ - 2 Score 4  Altered sleeping 2  Tired, decreased energy 3  Change in appetite 0  Feeling bad or failure about yourself  0  Trouble concentrating 1  Moving slowly or fidgety/restless 1  Suicidal thoughts 0  PHQ-9 Score 11     {Show previous vital signs (optional):23777}    ASSESSMENT/PLAN:   Problem List Items Addressed This Visit       Other   Obesity    Check for comorbidities and  start GLP-1/GIP, I think she would greatly benefit from the medication to help with weight loss.  Discussed that insurance may not cover medication.      Relevant Medications   tirzepatide (ZEPBOUND) 2.5 MG/0.5ML Pen   Other Relevant Orders   Hemoglobin A1c   Lipid Panel   Other Visit Diagnoses     Encounter for initial prescription of transdermal patch hormonal contraceptive device    -  Primary   Relevant Medications   norelgestromin-ethinyl estradiol [employer]) 150-35 MCG/24HR transdermal patch   Other Relevant Orders   POCT urine pregnancy (Completed)     Start estrogen patch per patient preference, no contraindications for starting.  Medication counseling provided.    Return in about 4 weeks (around 12/26/2022) for f/u weight loss.   Littie Deeds, MD [facility]

## 2022-11-29 DIAGNOSIS — R7303 Prediabetes: Secondary | ICD-10-CM | POA: Insufficient documentation

## 2022-11-29 LAB — LIPID PANEL
Chol/HDL Ratio: 3.6 ratio (ref 0.0–4.4)
Cholesterol, Total: 142 mg/dL (ref 100–199)
HDL: 39 mg/dL — ABNORMAL LOW (ref >39)
LDL Chol Calc (NIH): 82 mg/dL (ref 0–99)
Triglycerides: 113 mg/dL (ref 0–149)
VLDL Cholesterol Cal: 21 mg/dL (ref 5–40)

## 2022-11-29 LAB — HEMOGLOBIN A1C
Est. average glucose Bld gHb Est-mCnc: 126 mg/dL
Hgb A1c MFr Bld: 6 % — ABNORMAL HIGH (ref 4.8–5.6)

## 2022-12-07 DIAGNOSIS — Z130 Encounter for screening for diseases of the blood and blood-forming organs and certain disorders involving the immune mechanism: Secondary | ICD-10-CM | POA: Diagnosis not present

## 2022-12-07 DIAGNOSIS — Z319 Encounter for procreative management, unspecified: Secondary | ICD-10-CM | POA: Diagnosis not present

## 2022-12-07 DIAGNOSIS — Z124 Encounter for screening for malignant neoplasm of cervix: Secondary | ICD-10-CM | POA: Diagnosis not present

## 2022-12-07 DIAGNOSIS — Z01419 Encounter for gynecological examination (general) (routine) without abnormal findings: Secondary | ICD-10-CM | POA: Diagnosis not present

## 2022-12-08 ENCOUNTER — Other Ambulatory Visit (HOSPITAL_COMMUNITY): Payer: Self-pay

## 2022-12-08 ENCOUNTER — Telehealth: Payer: Self-pay

## 2022-12-08 ENCOUNTER — Encounter: Payer: Self-pay | Admitting: Hematology

## 2022-12-08 NOTE — Telephone Encounter (Signed)
Patient calls nurse line in regards to Zepbound.   She reports she has a diagnoses of prediabetes verified by blood work.   Will forward to [place].

## 2022-12-15 DIAGNOSIS — F319 Bipolar disorder, unspecified: Secondary | ICD-10-CM | POA: Diagnosis not present

## 2022-12-15 NOTE — Telephone Encounter (Signed)
Patient calls nurse line in regards to weight loss injectable.   She reports the out of pocket cost is over 1,000 dollars. Patient advised there is not much we can do at this point with her insurance and injectable medications.   Patient wishes to schedule with PCP to discuss other avenues.   Patient scheduled for 5/31.

## 2022-12-22 ENCOUNTER — Ambulatory Visit (INDEPENDENT_AMBULATORY_CARE_PROVIDER_SITE_OTHER): Payer: Medicaid Other | Admitting: Family Medicine

## 2022-12-22 ENCOUNTER — Encounter: Payer: Self-pay | Admitting: Family Medicine

## 2022-12-22 VITALS — BP 110/70 | HR 80 | Ht 65.0 in | Wt 196.0 lb

## 2022-12-22 DIAGNOSIS — R7303 Prediabetes: Secondary | ICD-10-CM

## 2022-12-22 MED ORDER — METFORMIN HCL ER 500 MG PO TB24: 500.0000 mg | ORAL_TABLET | Freq: Every day | ORAL | 0 refills | Status: AC

## 2022-12-22 NOTE — Progress Notes (Signed)
    SUBJECTIVE:   CHIEF COMPLAINT / HPI:  Chief Complaint  Patient presents with   discuss weight loss    And review A1C    Patient accompanied by wife today.  Here to discuss weight loss medication and to further discuss recent A1c results which showed A1c 6.0 in prediabetes range. I previously tried to prescribe her Zyban for weight loss but this was not covered by her insurance. She is concerned about her prediabetes and concerned about developing diabetes.  She would like to be on a medication to better control her sugars. She and her wife are wanting to become pregnant again.  Patient has gone to see her OB/GYN but was told that she will need her A1c in normal range (not prediabetes) before they can attempt pregnancy.  Her OB/GYN has also referred her to a fertility specialist.  PERTINENT  PMH / PSH: prediabetes, obesity  Patient Care Team: Littie Deeds, MD as PCP - General (Family Medicine) Sherian Rein, MD as Consulting Physician (Obstetrics and Gynecology)   OBJECTIVE:   BP 110/70   Pulse 80   Ht 5\' 5"  (1.651 m)   Wt 196 lb (88.9 kg)   LMP 12/16/2022 (Exact Date)   SpO2 99%   BMI 32.62 kg/m   Physical Exam Constitutional:      General: She is not in acute distress. HENT:     Head: Normocephalic and atraumatic.  Cardiovascular:     Rate and Rhythm: Normal rate and regular rhythm.  Pulmonary:     Effort: Pulmonary effort is normal. No respiratory distress.     Breath sounds: Normal breath sounds.  Musculoskeletal:     Cervical back: Neck supple.  Neurological:     Mental Status: She is alert.         12/22/2022    9:18 AM  Depression screen PHQ 2/9  Decreased Interest 2  Down, Depressed, Hopeless 2  PHQ - 2 Score 4  Altered sleeping 2  Tired, decreased energy 3  Change in appetite 1  Feeling bad or failure about yourself  0  Trouble concentrating 1  Moving slowly or fidgety/restless 1  Suicidal thoughts 0  PHQ-9 Score 12     {Show  previous vital signs (optional):23777}  Last hemoglobin A1c Lab Results  Component Value Date   HGBA1C 6.0 (H) 11/28/2022      ASSESSMENT/PLAN:   Problem List Items Addressed This Visit       Other   Prediabetes - Primary    Will start metformin given patient preference and desire for weight loss.  Will plan to repeat A1c 3 months after last check given that she needs normal A1c to conceive.  Can start at metformin 500 mg daily, advised that she can increase to twice daily after 1 month if tolerating well.      Relevant Medications   metFORMIN (GLUCOPHAGE-XR) 500 MG 24 hr tablet    Return in about 2 months (around 02/21/2023) for f/u prediabetes.   Littie Deeds, MD [facility]

## 2022-12-22 NOTE — Assessment & Plan Note (Addendum)
Will start metformin given patient preference and desire for weight loss.  Will plan to repeat A1c 3 months after last check given that she needs normal A1c to conceive.  Can start at metformin 500 mg daily, advised that she can increase to twice daily after 1 month if tolerating well.

## 2022-12-22 NOTE — Patient Instructions (Addendum)
It was nice seeing you today!  Start metformin once a day with breakfast. You can increase to twice a day after a month if no issues.  Stay well, Littie Deeds, MD [facility] [phone]  --  Make sure to check out at the front desk before you leave today.  Please arrive at least 15 minutes prior to your scheduled appointments.  If you had blood work today, I will send you a MyChart message or a letter if results are normal. Otherwise, I will give you a call.  If you had a referral placed, they will call you to set up an appointment. Please give [place] a call if you don't hear back in the next 2 weeks.  If you need additional refills before your next appointment, please call your pharmacy first.

## 2022-12-29 DIAGNOSIS — F319 Bipolar disorder, unspecified: Secondary | ICD-10-CM | POA: Diagnosis not present

## 2023-01-12 DIAGNOSIS — F319 Bipolar disorder, unspecified: Secondary | ICD-10-CM | POA: Diagnosis not present

## 2023-02-09 DIAGNOSIS — F319 Bipolar disorder, unspecified: Secondary | ICD-10-CM | POA: Diagnosis not present

## 2023-03-23 DIAGNOSIS — F319 Bipolar disorder, unspecified: Secondary | ICD-10-CM | POA: Diagnosis not present

## 2023-04-20 DIAGNOSIS — F319 Bipolar disorder, unspecified: Secondary | ICD-10-CM | POA: Diagnosis not present

## 2023-05-01 DIAGNOSIS — R519 Headache, unspecified: Secondary | ICD-10-CM | POA: Diagnosis not present

## 2023-05-01 DIAGNOSIS — J029 Acute pharyngitis, unspecified: Secondary | ICD-10-CM | POA: Diagnosis not present

## 2023-05-01 DIAGNOSIS — M791 Myalgia, unspecified site: Secondary | ICD-10-CM | POA: Diagnosis not present

## 2023-05-01 DIAGNOSIS — R0981 Nasal congestion: Secondary | ICD-10-CM | POA: Diagnosis not present

## 2023-05-01 DIAGNOSIS — R6883 Chills (without fever): Secondary | ICD-10-CM | POA: Diagnosis not present

## 2023-05-01 DIAGNOSIS — R051 Acute cough: Secondary | ICD-10-CM | POA: Diagnosis not present

## 2023-05-01 DIAGNOSIS — R067 Sneezing: Secondary | ICD-10-CM | POA: Diagnosis not present

## 2023-05-01 DIAGNOSIS — J3489 Other specified disorders of nose and nasal sinuses: Secondary | ICD-10-CM | POA: Diagnosis not present

## 2023-05-01 DIAGNOSIS — R0982 Postnasal drip: Secondary | ICD-10-CM | POA: Diagnosis not present

## 2023-05-04 DIAGNOSIS — F319 Bipolar disorder, unspecified: Secondary | ICD-10-CM | POA: Diagnosis not present

## 2023-06-01 DIAGNOSIS — F319 Bipolar disorder, unspecified: Secondary | ICD-10-CM | POA: Diagnosis not present

## 2023-08-27 ENCOUNTER — Ambulatory Visit (INDEPENDENT_AMBULATORY_CARE_PROVIDER_SITE_OTHER): Payer: Medicaid Other

## 2023-08-27 DIAGNOSIS — Z23 Encounter for immunization: Secondary | ICD-10-CM | POA: Diagnosis present

## 2023-08-27 NOTE — Progress Notes (Signed)
 Patient presents to nurse clinic for flu vaccination. Administered in LD, site unremarkable, tolerated injection well.   Veronda Prude, RN

## 2024-03-12 DIAGNOSIS — F331 Major depressive disorder, recurrent, moderate: Secondary | ICD-10-CM | POA: Diagnosis not present

## 2024-04-14 ENCOUNTER — Encounter: Payer: Self-pay | Admitting: Hematology

## 2024-04-14 ENCOUNTER — Ambulatory Visit: Admission: EM | Admit: 2024-04-14 | Discharge: 2024-04-14 | Disposition: A | Discharge status: Home / Self Care

## 2024-04-14 ENCOUNTER — Encounter: Payer: Self-pay | Admitting: Emergency Medicine

## 2024-04-14 DIAGNOSIS — R062 Wheezing: Secondary | ICD-10-CM | POA: Diagnosis not present

## 2024-04-14 DIAGNOSIS — B349 Viral infection, unspecified: Secondary | ICD-10-CM | POA: Diagnosis not present

## 2024-04-14 LAB — POC COVID19/FLU A&B COMBO
Covid Antigen, POC: NEGATIVE
Influenza A Antigen, POC: NEGATIVE
Influenza B Antigen, POC: NEGATIVE

## 2024-04-14 LAB — POCT URINE PREGNANCY: Preg Test, Ur: NEGATIVE

## 2024-04-14 MED ORDER — ALBUTEROL SULFATE HFA 108 (90 BASE) MCG/ACT IN AERS: 1.0000 | INHALATION_SPRAY | Freq: Four times a day (QID) | RESPIRATORY_TRACT | 2 refills | Status: AC | PRN

## 2024-04-14 MED ORDER — AZELASTINE HCL 0.1 % NA SOLN: 1.0000 | Freq: Two times a day (BID) | NASAL | 1 refills | Status: AC

## 2024-04-14 MED ORDER — PROMETHAZINE-DM 6.25-15 MG/5ML PO SYRP: 5.0000 mL | ORAL_SOLUTION | Freq: Four times a day (QID) | ORAL | 0 refills | Status: AC | PRN

## 2024-04-14 MED ORDER — PREDNISONE 20 MG PO TABS: 40.0000 mg | ORAL_TABLET | Freq: Every day | ORAL | 0 refills | Status: AC

## 2024-04-14 NOTE — ED Triage Notes (Signed)
 Pt c/o body aches, chills, sore throat, congestion, runny eyes/nose since Sunday night

## 2024-04-14 NOTE — ED Provider Notes (Signed)
 [facility]  Note:  This document was prepared using Dragon voice recognition software and may include unintentional dictation errors.  MRN: [id number] DOB: [birth year]  Subjective:   Kathryn Fox is a 33 y.o. female presenting for sore throat, nasal congestion, runny nose, body aches, chills and cough x 2 days.  Patient reports that cough is usually worse at night.  Patient denies any past history of asthma or COPD.  Patient does have a history of cigarette smoking.  Patient has been using over-the-counter cough suppressant medication with minimal improvement.  Denies any known sick contacts.  No shortness of breath, chest pain, weakness, dizziness.  No current facility-administered medications for this encounter.  Current Outpatient Medications:    albuterol  (VENTOLIN  HFA) 108 (90 Base) MCG/ACT inhaler, Inhale 1-2 puffs into the lungs every 6 (six) hours as needed for wheezing or shortness of breath., Disp: 8 g, Rfl: 2   azelastine  (ASTELIN ) 0.1 % nasal spray, Place 1 spray into both nostrils 2 (two) times daily. Use in each nostril as directed, Disp: 30 mL, Rfl: 1   predniSONE  (DELTASONE ) 20 MG tablet, Take 2 tablets (40 mg total) by mouth daily for 5 days., Disp: 10 tablet, Rfl: 0   promethazine -dextromethorphan (PROMETHAZINE -DM) 6.25-15 MG/5ML syrup, Take 5 mLs by mouth 4 (four) times daily as needed for cough., Disp: 118 mL, Rfl: 0   FLUoxetine  (PROZAC ) 20 MG capsule, TAKE 1 CAPSULE BY MOUTH EVERY DAY, Disp: 90 capsule, Rfl: 1   hydrOXYzine  (ATARAX ) 10 MG tablet, Take 1 tablet (10 mg total) by mouth 3 (three) times daily as needed., Disp: 30 tablet, Rfl: 0   metFORMIN  (GLUCOPHAGE -XR) 500 MG 24 hr tablet, Take 1 tablet (500 mg total) by mouth daily with breakfast., Disp: 90 tablet, Rfl: 0   norelgestromin -ethinyl estradiol  (XULANE) 150-35 MCG/24HR transdermal patch, Place 1 patch onto the skin once a week., Disp: 3 patch, Rfl: 12   valACYclovir  (VALTREX ) 500 MG  tablet, Take 500 mg by mouth daily., Disp: , Rfl:    Allergies  Allergen Reactions   Mushroom Extract Complex (Obsolete) Anaphylaxis    Facial swelling   Peanut-Containing Drug Products Anaphylaxis   Penicillins Anaphylaxis   Septra [Sulfamethoxazole-Trimethoprim] Hives    Past Medical History:  Diagnosis Date   Anemia affecting second pregnancy 07/25/2018   Anemia in pregnancy, third trimester 07/18/2018   Depression    hx of, no current problems on zoloft    GERD (gastroesophageal reflux disease)    Headache    HSV (herpes simplex virus) anogenital infection    Iron deficiency anemia 07/18/2018   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 Colposcopy in April, 2015 clinically normal. Recommend repeat her test Pap in one year.    Nausea and vomiting during pregnancy 01/09/2018   Urinary tract infection affecting pregnancy 01/09/2018   Vaginal Pap smear, abnormal    bx, f/u ok since     Past Surgical History:  Procedure Laterality Date   BREAST FIBROADENOMA SURGERY     BREAST REDUCTION SURGERY Bilateral 05/14/2020   Procedure: MAMMARY REDUCTION  (BREAST);  Surgeon: Arelia Filippo, MD;  Location: [facility];  Service: Plastics;  Laterality: Bilateral;    Family History  Problem Relation Age of Onset   Cancer Maternal Aunt        Breast   Diabetes Maternal Aunt    Cancer Maternal Grandfather        lung   Hypertension  Mother    Hyperlipidemia Mother    Diabetes Mother    Congestive Heart Failure Mother    Healthy Father     Social History   Tobacco Use   Smoking status: Former    Current packs/day: 0.00    Types: Cigars, Cigarettes    Quit date: 05/24/2014    Years since quitting: 9.8   Smokeless tobacco: Never   Tobacco comments:    also smoked hookah  Vaping Use   Vaping status: Never Used  Substance Use Topics   Alcohol use: No   Drug use: No    ROS Refer to HPI  for ROS details.  Objective:   Vitals: BP 130/79 (BP Location: Right Arm)   Pulse 85   Temp 98.8 F (37.1 C) (Oral)   Resp 17   LMP 01/22/2024 (Approximate)   SpO2 97%   Physical Exam Vitals and nursing note reviewed.  Constitutional:      General: She is not in acute distress.    Appearance: Normal appearance. She is well-developed. She is not ill-appearing or toxic-appearing.  HENT:     Head: Normocephalic and atraumatic.     Nose: Congestion and rhinorrhea present.     Mouth/Throat:     Mouth: Mucous membranes are moist.  Eyes:     General:        Right eye: No discharge.        Left eye: No discharge.     Extraocular Movements: Extraocular movements intact.     Conjunctiva/sclera: Conjunctivae normal.  Cardiovascular:     Rate and Rhythm: Normal rate and regular rhythm.     Heart sounds: Normal heart sounds. No murmur heard. Pulmonary:     Effort: Pulmonary effort is normal. No respiratory distress.     Breath sounds: No stridor. Wheezing present. No rhonchi or rales.  Chest:     Chest wall: No tenderness.  Skin:    General: Skin is warm and dry.  Neurological:     General: No focal deficit present.     Mental Status: She is alert and oriented to person, place, and time.  Psychiatric:        Mood and Affect: Mood normal.        Behavior: Behavior normal.     Procedures  Results for orders placed or performed during the hospital encounter of 04/14/24 (from the past 24 hours)  POCT urine pregnancy     Status: None   Collection Time: 04/14/24  2:37 PM  Result Value Ref Range   Preg Test, Ur Negative Negative  POC Covid19/Flu A&B Antigen     Status: None   Collection Time: 04/14/24  2:48 PM  Result Value Ref Range   Influenza A Antigen, POC Negative Negative   Influenza B Antigen, POC Negative Negative   Covid Antigen, POC Negative Negative    No results found.   Assessment and Plan :     Discharge Instructions       1. Acute viral syndrome  (Primary) - POC Covid19/Flu A&B Antigen completed in UC is negative for COVID and influenza - POCT urine pregnancy completed in UC is negative for pregnancy - azelastine  (ASTELIN ) 0.1 % nasal spray; Place 1 spray into both nostrils 2 (two) times daily. Use in each nostril as directed  Dispense: 30 mL; Refill: 1 - promethazine -dextromethorphan (PROMETHAZINE -DM) 6.25-15 MG/5ML syrup; Take 5 mLs by mouth 4 (four) times daily as needed for cough.  Dispense: 118 mL; Refill: 0  2.  Wheezing - predniSONE  (DELTASONE ) 20 MG tablet; Take 2 tablets (40 mg total) by mouth daily for 5 days.  Dispense: 10 tablet; Refill: 0 - albuterol  (VENTOLIN  HFA) 108 (90 Base) MCG/ACT inhaler; Inhale 1-2 puffs into the lungs every 6 (six) hours as needed for wheezing or shortness of breath.  Dispense: 8 g; Refill: 2  -Continue to monitor symptoms for any change in severity if there is any escalation of current symptoms or development of new symptoms follow-up in ER for further evaluation and management.      Makell Drohan B Nickey Canedo   An Schnabel, [place], [place] 04/14/24 1544

## 2024-04-14 NOTE — Discharge Instructions (Signed)
  1. Acute viral syndrome (Primary) - POC Covid19/Flu A&B Antigen completed in UC is negative for COVID and influenza - POCT urine pregnancy completed in UC is negative for pregnancy - azelastine  (ASTELIN ) 0.1 % nasal spray; Place 1 spray into both nostrils 2 (two) times daily. Use in each nostril as directed  Dispense: 30 mL; Refill: 1 - promethazine -dextromethorphan (PROMETHAZINE -DM) 6.25-15 MG/5ML syrup; Take 5 mLs by mouth 4 (four) times daily as needed for cough.  Dispense: 118 mL; Refill: 0  2. Wheezing - predniSONE  (DELTASONE ) 20 MG tablet; Take 2 tablets (40 mg total) by mouth daily for 5 days.  Dispense: 10 tablet; Refill: 0 - albuterol  (VENTOLIN  HFA) 108 (90 Base) MCG/ACT inhaler; Inhale 1-2 puffs into the lungs every 6 (six) hours as needed for wheezing or shortness of breath.  Dispense: 8 g; Refill: 2  -Continue to monitor symptoms for any change in severity if there is any escalation of current symptoms or development of new symptoms follow-up in ER for further evaluation and management.

## 2024-04-25 ENCOUNTER — Ambulatory Visit: Payer: Self-pay | Admitting: Family Medicine

## 2024-04-25 ENCOUNTER — Encounter: Admitting: Family Medicine

## 2024-04-25 ENCOUNTER — Other Ambulatory Visit (HOSPITAL_COMMUNITY)
Admission: RE | Admit: 2024-04-25 | Discharge: 2024-04-25 | Disposition: A | Discharge status: Home / Self Care | Source: Ambulatory Visit | Attending: Family Medicine | Admitting: Family Medicine

## 2024-04-25 ENCOUNTER — Ambulatory Visit: Admitting: Family Medicine

## 2024-04-25 ENCOUNTER — Encounter: Payer: Self-pay | Admitting: Family Medicine

## 2024-04-25 VITALS — BP 130/85 | HR 75 | Ht 65.0 in | Wt 197.4 lb

## 2024-04-25 DIAGNOSIS — R058 Other specified cough: Secondary | ICD-10-CM | POA: Diagnosis not present

## 2024-04-25 DIAGNOSIS — Z124 Encounter for screening for malignant neoplasm of cervix: Secondary | ICD-10-CM | POA: Diagnosis present

## 2024-04-25 DIAGNOSIS — Z113 Encounter for screening for infections with a predominantly sexual mode of transmission: Secondary | ICD-10-CM | POA: Insufficient documentation

## 2024-04-25 DIAGNOSIS — R5383 Other fatigue: Secondary | ICD-10-CM | POA: Diagnosis not present

## 2024-04-25 LAB — POCT WET PREP (WET MOUNT)
Clue Cells Wet Prep Whiff POC: POSITIVE
Trichomonas Wet Prep HPF POC: ABSENT

## 2024-04-25 MED ORDER — METRONIDAZOLE 0.75 % VA GEL: 1.0000 | Freq: Every day | VAGINAL | 0 refills | Status: AC

## 2024-04-25 MED ORDER — HYDROXYZINE HCL 10 MG PO TABS: 10.0000 mg | ORAL_TABLET | Freq: Three times a day (TID) | ORAL | 0 refills | Status: AC | PRN

## 2024-04-25 NOTE — Patient Instructions (Addendum)
 Thank you for visiting clinic today and allowing us  to participate in your care!  We collected a pap smear and routine STI/STD testing today. I will let you know of the results when they return.   Continue albuterol  as needed. Your cough may last for a couple weeks. Stay hydrated and get plenty of rest. If you have worse difficulty breathing or chest pain, please go to the ED.   Please schedule an appointment in 1-2 weeks if you are not feeling better.   Reach out any time with any questions or concerns you may have - we are here for you!  Damien Cassis, MD [facility] [phone]

## 2024-04-25 NOTE — Progress Notes (Signed)
    SUBJECTIVE:   Chief compliant/HPI: annual examination  Kathryn Fox is a 33 y.o. who presents today for an annual exam.   Current concerns: - Vaginal discharge and odor recently, interested in routine STI/STD screening, not interested in [facility] at this time, LMP started Sunday  - Feels tired overall, has low moods and experiences panic at times, not interested in daily medication, is thinking about reconnecting with therapy  - Has had bronchitis over past couple of weeks, slowly improving, was given albuterol  and steroids which helped, still feels chest tightness/cough especially at night   OBJECTIVE:   BP 130/85   Pulse 75   Ht 5' 5 (1.651 m)   Wt 197 lb 6 oz (89.5 kg)   LMP 04/20/2024 (Approximate)   SpO2 100%   BMI 32.84 kg/m   General: Well-appearing. Resting comfortably in room. CV: Normal S1/S2. No extra heart sounds. Warm and well-perfused. Pulm: Breathing comfortably on room air. CTAB. No increased WOB. Skin:  Warm, dry. Psych: Pleasant and appropriate.  GU: Normal appearing external genitalia. Normal appearing cervix and vaginal canal. Moderate amount of white discharge noted. Exam chaperoned by CMA.   ASSESSMENT/PLAN:   Assessment & Plan Cervical cancer screening Routine screening for STI (sexually transmitted infection) Per chart review, patient with hx of LGSIL on pap with normal colposcopy and normal pap thereafter. Plan for repeat pap today.  - Wet prep suggestive of BV - discussed metronidazole  gel x 5 days  - Routine STI/STD testing  - Pap smear with HPV cotest collected  Fatigue, unspecified type -CBC, Ferritin, TSH today  Other cough Reassuring cardio/pulm exam today. Per chart review, patient recently seen in UC for acute viral syndrome, given albuterol  and prednisone  with improvement of symptoms. Continue supportive care at home. Return/ED precautions discussed.   Annual Examination  Flowsheet Row Office Visit from 04/25/2024 in [facility] [place]  PHQ-9 Total Score 15    PHQ score 15, reviewed and discussed. Encouraged patient to reconnect with therapy. Discussed hydroxyzine  as needed per patient preference. Encouraged patient to let us  know if she becomes interested in regular medication.   Blood pressure reviewed and at goal.   Considered the following items based upon USPSTF recommendations: HIV testing:ordered Hepatitis B:recently completed and result reviewed, normal  Syphilis if at high risk: ordered GC/CT ordered per above Cervical cancer screening: Collected MyChart Activation:Already signed up  Follow up in 1 year or sooner as needed.   Damien Cassis, MD [facility]
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
## 2024-04-26 LAB — CBC
Hematocrit: 36.2 % (ref 34.0–46.6)
Hemoglobin: 11.3 g/dL (ref 11.1–15.9)
MCH: 25.6 pg — ABNORMAL LOW (ref 26.6–33.0)
MCHC: 31.2 g/dL — ABNORMAL LOW (ref 31.5–35.7)
MCV: 82 fL (ref 79–97)
Platelets: 382 x10E3/uL (ref 150–450)
RBC: 4.41 x10E6/uL (ref 3.77–5.28)
RDW: 14 % (ref 11.7–15.4)
WBC: 10.4 x10E3/uL (ref 3.4–10.8)

## 2024-04-26 LAB — TSH: TSH: 1.35 u[IU]/mL (ref 0.450–4.500)

## 2024-04-26 LAB — FERRITIN: Ferritin: 51 ng/mL (ref 15–150)

## 2024-04-26 LAB — HIV ANTIBODY (ROUTINE TESTING W REFLEX): HIV Screen 4th Generation wRfx: NONREACTIVE

## 2024-04-26 LAB — RPR: RPR Ser Ql: NONREACTIVE

## 2024-04-29 LAB — CYTOLOGY - PAP
Adequacy: ABSENT
Chlamydia: NEGATIVE
Comment: NEGATIVE
Comment: NEGATIVE
Comment: NEGATIVE
Comment: NORMAL
Diagnosis: NEGATIVE
High risk HPV: NEGATIVE
Neisseria Gonorrhea: NEGATIVE
Trichomonas: NEGATIVE

## 2024-05-02 ENCOUNTER — Ambulatory Visit

## 2024-05-02 DIAGNOSIS — Z23 Encounter for immunization: Secondary | ICD-10-CM

## 2024-05-02 NOTE — Progress Notes (Signed)
 Patient presents to nurse clinic for flu vaccine.  Vaccine given without complication.  See admin for details.

## 2024-05-07 DIAGNOSIS — F331 Major depressive disorder, recurrent, moderate: Secondary | ICD-10-CM | POA: Diagnosis not present

## 2024-05-19 DIAGNOSIS — F331 Major depressive disorder, recurrent, moderate: Secondary | ICD-10-CM | POA: Diagnosis not present

## 2024-06-13 DIAGNOSIS — F411 Generalized anxiety disorder: Secondary | ICD-10-CM | POA: Diagnosis not present

## 2024-06-24 DIAGNOSIS — F331 Major depressive disorder, recurrent, moderate: Secondary | ICD-10-CM | POA: Diagnosis not present

## 2024-07-01 DIAGNOSIS — F411 Generalized anxiety disorder: Secondary | ICD-10-CM | POA: Diagnosis not present
